# Patient Record
Sex: Female | Born: 1965 | Race: White | Hispanic: No
Health system: Midwestern US, Community
[De-identification: ages and names within clinical notes are randomized; demographics above are authoritative.]

## PROBLEM LIST (undated history)

## (undated) DIAGNOSIS — N809 Endometriosis, unspecified: Secondary | ICD-10-CM

## (undated) DIAGNOSIS — C801 Malignant (primary) neoplasm, unspecified: Secondary | ICD-10-CM

## (undated) DIAGNOSIS — I10 Essential (primary) hypertension: Secondary | ICD-10-CM

## (undated) DIAGNOSIS — N390 Urinary tract infection, site not specified: Secondary | ICD-10-CM

## (undated) DIAGNOSIS — G43909 Migraine, unspecified, not intractable, without status migrainosus: Secondary | ICD-10-CM

## (undated) DIAGNOSIS — G8929 Other chronic pain: Secondary | ICD-10-CM

## (undated) DIAGNOSIS — Z8711 Personal history of peptic ulcer disease: Secondary | ICD-10-CM

## (undated) DIAGNOSIS — D649 Anemia, unspecified: Secondary | ICD-10-CM

## (undated) DIAGNOSIS — K219 Gastro-esophageal reflux disease without esophagitis: Secondary | ICD-10-CM

## (undated) DIAGNOSIS — Z8719 Personal history of other diseases of the digestive system: Secondary | ICD-10-CM

## (undated) DIAGNOSIS — E538 Deficiency of other specified B group vitamins: Secondary | ICD-10-CM

## (undated) DIAGNOSIS — K429 Umbilical hernia without obstruction or gangrene: Secondary | ICD-10-CM

## (undated) DIAGNOSIS — M5116 Intervertebral disc disorders with radiculopathy, lumbar region: Secondary | ICD-10-CM

## (undated) DIAGNOSIS — N200 Calculus of kidney: Secondary | ICD-10-CM

## (undated) DIAGNOSIS — M5020 Other cervical disc displacement, unspecified cervical region: Secondary | ICD-10-CM

## (undated) DIAGNOSIS — M940 Chondrocostal junction syndrome [Tietze]: Secondary | ICD-10-CM

## (undated) DIAGNOSIS — H43399 Other vitreous opacities, unspecified eye: Secondary | ICD-10-CM

## (undated) DIAGNOSIS — F4329 Adjustment disorder with other symptoms: Secondary | ICD-10-CM

## (undated) DIAGNOSIS — J069 Acute upper respiratory infection, unspecified: Secondary | ICD-10-CM

## (undated) DIAGNOSIS — M9908 Segmental and somatic dysfunction of rib cage: Secondary | ICD-10-CM

## (undated) DIAGNOSIS — R21 Rash and other nonspecific skin eruption: Secondary | ICD-10-CM

## (undated) DIAGNOSIS — E559 Vitamin D deficiency, unspecified: Secondary | ICD-10-CM

## (undated) DIAGNOSIS — Z79818 Long term (current) use of other agents affecting estrogen receptors and estrogen levels: Secondary | ICD-10-CM

## (undated) DIAGNOSIS — M5416 Radiculopathy, lumbar region: Secondary | ICD-10-CM

## (undated) DIAGNOSIS — M51369 Other intervertebral disc degeneration, lumbar region without mention of lumbar back pain or lower extremity pain: Secondary | ICD-10-CM

## (undated) DIAGNOSIS — M7520 Bicipital tendinitis, unspecified shoulder: Secondary | ICD-10-CM

## (undated) DIAGNOSIS — E876 Hypokalemia: Secondary | ICD-10-CM

## (undated) DIAGNOSIS — M5136 Other intervertebral disc degeneration, lumbar region: Secondary | ICD-10-CM

## (undated) HISTORY — DX: Long term (current) use of other agents affecting estrogen receptors and estrogen levels: Z79.818

## (undated) HISTORY — DX: Acute upper respiratory infection, unspecified: J06.9

## (undated) HISTORY — DX: Bicipital tendinitis, unspecified shoulder: M75.20

## (undated) HISTORY — PX: HX COLPOSCOPY: SHX161

## (undated) HISTORY — PX: LUMBAR EPIDURAL INJECTION: SHX1980

## (undated) HISTORY — DX: Umbilical hernia without obstruction or gangrene: K42.9

## (undated) HISTORY — PX: UMBILICAL HERNIA REPAIR: SHX196

## (undated) HISTORY — PX: HX PELVIC LAPAROSCOPY: SHX162

## (undated) HISTORY — DX: Deficiency of other specified B group vitamins: E53.8

## (undated) HISTORY — DX: Hypokalemia: E87.6

## (undated) HISTORY — PX: LASIK: SHX215

## (undated) HISTORY — DX: Other intervertebral disc degeneration, lumbar region without mention of lumbar back pain or lower extremity pain: M51.369

## (undated) HISTORY — DX: Vitamin D deficiency, unspecified: E55.9

## (undated) HISTORY — DX: Calculus of kidney: N20.0

## (undated) HISTORY — PX: HX MENISCECTOMY: SHX123

## (undated) HISTORY — PX: HX CARPAL TUNNEL RELEASE: SHX101

## (undated) HISTORY — DX: Rash and other nonspecific skin eruption: R21

## (undated) HISTORY — DX: Gastro-esophageal reflux disease without esophagitis: K21.9

## (undated) HISTORY — DX: Endometriosis, unspecified: N80.9

## (undated) HISTORY — DX: Migraine, unspecified, not intractable, without status migrainosus: G43.909

## (undated) HISTORY — DX: Segmental and somatic dysfunction of rib cage: M99.08

## (undated) HISTORY — PX: CYSTOSCOPY: SUR368

## (undated) HISTORY — DX: Essential (primary) hypertension: I10

## (undated) HISTORY — DX: Chondrocostal junction syndrome (tietze): M94.0

## (undated) HISTORY — PX: HX DACRYOCYSTORHINOSTOMY: 2100001142

## (undated) HISTORY — PX: HX LUMBAR DISKECTOMY: SHX99

## (undated) HISTORY — DX: Adjustment disorder with other symptoms: F43.29

## (undated) HISTORY — DX: Other intervertebral disc degeneration, lumbar region: M51.36

## (undated) HISTORY — DX: Other chronic pain: G89.29

## (undated) HISTORY — PX: LAMINECTOMY: SHX219

## (undated) HISTORY — DX: Radiculopathy, lumbar region: M54.16

## (undated) HISTORY — DX: Other cervical disc displacement, unspecified cervical region: M50.20

## (undated) HISTORY — PX: SKIN CANCER EXCISION: SHX779

## (undated) HISTORY — DX: Other vitreous opacities, unspecified eye: H43.399

## (undated) HISTORY — PX: FULKERSON OSTEOTOMY: SHX1689

## (undated) HISTORY — PX: KNEE ARTHROSCOPY WITH FULKERSON SLIDE: SHX5648

## (undated) HISTORY — PX: LAPAROSCOPIC ENDOMETRIOSIS FULGURATION: SUR769

## (undated) HISTORY — PX: TONSILLECTOMY AND ADENOIDECTOMY: SHX28

## (undated) HISTORY — DX: Anemia, unspecified: D64.9

## (undated) HISTORY — DX: Personal history of other diseases of the digestive system: Z87.19

## (undated) HISTORY — DX: Personal history of peptic ulcer disease: Z87.11

## (undated) HISTORY — DX: Malignant (primary) neoplasm, unspecified: C80.1

## (undated) HISTORY — DX: Urinary tract infection, site not specified: N39.0

## (undated) HISTORY — PX: APPENDECTOMY: SHX54

## (undated) HISTORY — PX: OTHER SURGICAL HISTORY: SHX169

## (undated) HISTORY — PX: LUMBAR DISC SURGERY: SHX700

---
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
## 1984-05-25 DIAGNOSIS — O039 Complete or unspecified spontaneous abortion without complication: Secondary | ICD-10-CM

## 1984-05-25 HISTORY — DX: Complete or unspecified spontaneous abortion without complication: O03.9

## 1990-05-25 HISTORY — PX: HX APPENDECTOMY: SHX54

## 1999-06-06 ENCOUNTER — Emergency Department (HOSPITAL_COMMUNITY): Payer: Self-pay | Admitting: Emergency Medicine

## 2007-08-02 LAB — T3, FREE: T3,FREE: 2.9 pg/mL (ref 2.4–4.2)

## 2007-08-02 LAB — T4, FREE: T4, Free: 1 NG/DL (ref 0.89–1.76)

## 2007-08-02 LAB — TSH 3RD GENERATION: TSH: 3.02 u[IU]/mL (ref 0.35–5.5)

## 2007-09-20 LAB — METABOLIC PANEL, COMPREHENSIVE
A-G Ratio: 1.2 (ref 1.1–2.2)
ALT (SGPT): 29 U/L — ABNORMAL LOW (ref 30–65)
AST (SGOT): 10 U/L — ABNORMAL LOW (ref 15–37)
Albumin: 3.9 g/dL (ref 3.5–5.0)
Alk. phosphatase: 73 U/L (ref 50–136)
Anion gap: 11 mmol/L (ref 5–15)
BUN/Creatinine ratio: 18 (ref 12–20)
BUN: 14 MG/DL (ref 6–20)
Bilirubin, total: 0.2 MG/DL (ref <1.0)
CO2: 27 MMOL/L (ref 21–32)
Calcium: 9.2 MG/DL (ref 8.5–10.1)
Chloride: 103 MMOL/L (ref 97–108)
Creatinine: 0.8 MG/DL (ref 0.6–1.3)
GFR est AA: >60 mL/min/{1.73_m2} (ref >60)
GFR est non-AA: >60 mL/min/{1.73_m2} (ref >60)
Globulin: 3.3 g/dL (ref 2.0–4.0)
Glucose: 85 MG/DL (ref 50–100)
Potassium: 3.4 MMOL/L — ABNORMAL LOW (ref 3.5–5.1)
Protein, total: 7.2 g/dL (ref 6.4–8.2)
Sodium: 141 MMOL/L (ref 136–145)

## 2008-03-01 LAB — CBC WITH AUTOMATED DIFF
ABS. BASOPHILS: 0 10*3/uL (ref 0.0–0.1)
ABS. EOSINOPHILS: 0.4 10*3/uL (ref 0.0–0.4)
ABS. LYMPHOCYTES: 3.2 10*3/uL (ref 0.8–3.5)
ABS. MONOCYTES: 0.4 10*3/uL (ref 0–1.0)
ABS. NEUTROPHILS: 2.8 10*3/uL (ref 1.8–8.0)
BASOPHILS: 0 % (ref 0–1)
EOSINOPHILS: 5 % (ref 0–7)
HCT: 36.5 % (ref 35.0–47.0)
HGB: 12.6 g/dL (ref 11.5–16.0)
LYMPHOCYTES: 48 % (ref 12–49)
MCH: 31.8 PG (ref 26.0–34.0)
MCHC: 34.5 g/dL (ref 30.0–35.0)
MCV: 92.2 FL (ref 80.0–99.0)
MONOCYTES: 5 % (ref 5–13)
NEUTROPHILS: 42 % (ref 32–75)
PLATELET: 334 10*3/uL (ref 150–400)
RBC: 3.96 M/uL (ref 3.80–5.20)
RDW: 12.1 % (ref 11.5–14.5)
WBC: 6.8 10*3/uL (ref 3.6–11.0)

## 2008-03-01 LAB — LIPID PANEL
CHOL/HDL Ratio: 3.7 (ref 0–5.0)
Cholesterol, total: 183 MG/DL (ref <200)
HDL Cholesterol: 49 MG/DL (ref 40–60)
LDL, calculated: 126 MG/DL — ABNORMAL HIGH (ref 0–100)
Triglyceride: 40 MG/DL (ref 30–200)
VLDL, calculated: 8 MG/DL

## 2008-03-01 LAB — T4, FREE: T4, Free: 1 NG/DL (ref 0.9–1.8)

## 2008-03-01 LAB — METABOLIC PANEL, COMPREHENSIVE
A-G Ratio: 1.2 (ref 1.1–2.2)
ALT (SGPT): 34 U/L (ref 30–65)
AST (SGOT): 16 U/L (ref 15–37)
Albumin: 3.6 g/dL (ref 3.5–5.0)
Alk. phosphatase: 62 U/L (ref 50–136)
Anion gap: 9 mmol/L (ref 5–15)
BUN/Creatinine ratio: 19 (ref 12–20)
BUN: 13 MG/DL (ref 6–20)
Bilirubin, total: 0.3 MG/DL (ref <1.0)
CO2: 27 MMOL/L (ref 21–32)
Calcium: 9 MG/DL (ref 8.5–10.1)
Chloride: 102 MMOL/L (ref 97–108)
Creatinine: 0.7 MG/DL (ref 0.6–1.3)
GFR est AA: >60 mL/min/{1.73_m2} (ref >60)
GFR est non-AA: >60 mL/min/{1.73_m2} (ref >60)
Globulin: 3.1 g/dL (ref 2.0–4.0)
Glucose: 80 MG/DL (ref 50–100)
Potassium: 3.3 MMOL/L — ABNORMAL LOW (ref 3.5–5.1)
Protein, total: 6.7 g/dL (ref 6.4–8.2)
Sodium: 138 MMOL/L (ref 136–145)

## 2008-03-01 LAB — TSH 3RD GENERATION: TSH: 1.13 u[IU]/mL (ref 0.35–5.5)

## 2008-03-08 LAB — EC8 ISTAT, POC
BUN (POC): 11 MG/DL (ref 9–20)
Base excess (POC): 2 mmol/L — ABNORMAL LOW (ref 5–15)
CO2 (POC): 28 MMOL/L (ref 21–32)
Chloride (POC): 102 MMOL/L (ref 98–107)
Glucose (POC): 90 MG/DL (ref 65–105)
HCO3 (POC): 26.5 mmol/L — ABNORMAL HIGH (ref 22–26)
Hematocrit (POC): 38 % (ref 35.0–47.0)
Hemoglobin (POC): 12.9 GM/DL (ref 11.5–16.0)
Potassium (POC): 3.4 MMOL/L — ABNORMAL LOW (ref 3.5–5.1)
Sodium (POC): 136 MMOL/L — ABNORMAL LOW (ref 137–145)
pCO2 (POC): 41.5 MMHG (ref 35–45)
pH (POC): 7.41 (ref 7.35–7.45)

## 2008-03-09 NOTE — Op Note (Signed)
Op Notes signed by  at 03/09/08 0734                  Author: Nicky Pugh, MD  Service: --  Author Type: Physician            Filed:   Date of Service: 03/09/08 0101  Status: Signed          <!--EPICS--> Name:      [name], [name] MR #:      [id number]                    Surgeon:        Nicky Pugh, MD<BR> Account #: [id number]                  Surgery Date:   03/08/2008<BR> DOB:       08/26/1965<BR> Age:       42                           Location:<BR> <BR>                              OPERATIVE REPORT<BR> <BR> <BR> PREOPERATIVE DIAGNOSIS:  Patellar maltracking.<BR> <BR> POSTOPERATIVE  DIAGNOSES:<BR> 1.     Patellar maltracking.<BR> 2.     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     Fulkerson osteotomy.<BR> 2.     Lateral release, right knee.<BR>  3.     Chondroplasty, right medial femoral condyle.<BR> 4.     Chondroplasty, right patella.<BR> <BR> SURGEON:  Nicky Pugh, MD<BR> <BR> ASSISTANT:  Zachery C. Hamby, MD<BR> <BR> COMPLICATIONS:  None.<BR> <BR> ESTIMATED BLOOD LOSS:  Minimal.<BR> <BR>  SPECIMENS:<BR> <BR> CONDITION:  Postoperatively stable.<BR> <BR> INDICATIONS FOR PROCEDURE:  The patient is a very pleasant, 42 year old<BR> female, who presented to Dr. Franky Macho with persistent right knee pain.  She<BR> has a history of previous open lateral  release when she was a kid with<BR> significant persistent pain.  She was consented for the procedure above and<BR> voiced understanding to the risks, benefits, and alternatives to the<BR> procedure.<BR> <BR> PROCEDURE IN DETAIL:  The patient was sent  back to the operating room,<BR> transferred over to the operating table.  All bony prominences were padded,<BR> and preoperative antibiotics were given.  She was placed under general<BR> anesthesia by the Anesthesia Team.  Her right lower extremity was  then<BR> prepped and draped in meticulous sterile fashion.   A time-out was called<BR> and everybody in the room including Anesthesia agreed with the correct<BR> patient, procedure, and extremity.<BR> <BR> Standard posterior medial and posterior lateral  portals were made.  We did<BR> a diagnostic arthroscopy, which showed chondromalacia grade 2 and 3 of the<BR> medial femoral condyle and patella.  Also showed obvious patellar<BR> maltracking.  Both menisci were intact and the rest of the cartilage<BR>  throughout the knee was intact.  No loose bodies were appreciated, and ACL<BR> was intact.  We therefore did a chondroplasty of the undersurface of the<BR> patella and of the medial femoral condyle.  We then proceeded to the<BR> lateral release.  We used  a [employer] to arthroscopically<BR> laterally release her lateral retinaculum.  This was done without<BR> complication, and this was done from the superior aspect of the patella,<BR> down distal to the portal site.  The  patella seemed to track  somewhat<BR> better but was not greatly improved by this lateral release.  We therefore<BR> proceeded with a Fulkerson osteotomy.  We sterilely raised the end of the<BR> bed and proceeded with an incision over the tibial tubercle.  We dissected<BR> down  and found the patellar tendon and dissected around the patellar<BR> tendon.  At this time, we placed two 3.2 drill bits from medial to lateral<BR> along the tibial tubercle.  We used these as our guide and made a saw cut.<BR> We left the inferior aspect  of the tibial tubercle intact, switched and<BR> swung the tibial tubercle anteromedially.  This was done successfully and<BR> the patella tracking was much improved after we did this.  We then placed<BR> two 4.5 Synthes screws using a lag screw technique.   These were placed<BR> through the tibial tubercle and to the tibia.  This was done without<BR> complication.  We then irrigated the wounds copiously, closed deep with the<BR> one 0 Vicryl suture and then closed the  subcutaneous tissue with 2-0 Vicryl<BR>  and ran a subcuticular stitch, which was 3-0 Monocryl suture.  This was<BR> followed by Steri-Strips.  We closed the 2 portal sites with Prolene<BR> sutures.  Sterile dressing was then placed and she was placed in an<BR> immobilizer.  She will be given  postoperative instructions and sent home<BR> today.  Prescriptions were written and ready for her.  She will follow up<BR> with Dr. Franky Macho next week and call us with any concerns.<BR> <BR> <BR> Dictated by Blanch Media. Hamby, MD<BR> <BR> <BR> <BR> E-Signed  By<BR> Nicky Pugh, MD 03/09/2008 07:34<BR> Nicky Pugh, MD<BR> <BR> cc:   [facility], MD<BR> <BR> <BR> <BR> WB/FI3; D: 03/08/2008  2:22 P; T: 03/09/2008  1:01 A; Doc# [id number]; Job#<BR> [id number]<ZD> <!--EPICE-->

## 2008-03-09 NOTE — Op Note (Signed)
Name: [name], [name]  MR #: [id number] Surgeon: Nicky Pugh, MD  Account #: [id number] Surgery Date: 03/08/2008  DOB: 1966/02/14  Age: 42 Location:     OPERATIVE REPORT      PREOPERATIVE DIAGNOSIS: Patellar maltracking.    POSTOPERATIVE DIAGNOSES:  1. Patellar maltracking.  2. 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  3. 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    PROCEDURES:  1. Fulkerson osteotomy.  2. Lateral release, right knee.  3. Chondroplasty, right medial femoral condyle.  4. Chondroplasty, right patella.    SURGEON: Nicky Pugh, MD    ASSISTANT: Blanch Media. Costella Hatcher, MD    COMPLICATIONS: None.    ESTIMATED BLOOD LOSS: Minimal.    SPECIMENS:    CONDITION: Postoperatively stable.    INDICATIONS FOR PROCEDURE: The patient is [name] very pleasant, 42 year old  female, who presented to Dr. Franky Macho with persistent right knee pain. She  has [name] history of previous open lateral release when she was [name] kid with  significant persistent pain. She was consented for the procedure above and  voiced understanding to the risks, benefits, and alternatives to the  procedure.    PROCEDURE IN DETAIL: The patient was sent back to the operating room,  transferred over to the operating table. All bony prominences were padded,  and preoperative antibiotics were given. She was placed under general  anesthesia by the Anesthesia Team. Her right lower extremity was then  prepped and draped in meticulous sterile fashion. [name] time-out was called  and everybody in the room including Anesthesia agreed with the correct  patient, procedure, and extremity.    Standard posterior medial and posterior lateral portals were made. We did  [name] diagnostic arthroscopy, which showed chondromalacia grade 2 and 3 of the  medial femoral condyle and patella. Also showed obvious patellar  maltracking. Both menisci were intact and the rest of the cartilage  throughout the knee was intact. No loose bodies were appreciated, and ACL   was intact. We therefore did [name] chondroplasty of the undersurface of the  patella and of the medial femoral condyle. We then proceeded to the  lateral release. We used [name] [employer] to arthroscopically  laterally release her lateral retinaculum. This was done without  complication, and this was done from the superior aspect of the patella,  down distal to the portal site. The patella seemed to track somewhat  better but was not greatly improved by this lateral release. We therefore  proceeded with [name] Fulkerson osteotomy. We sterilely raised the end of the  bed and proceeded with an incision over the tibial tubercle. We dissected  down and found the patellar tendon and dissected around the patellar  tendon. At this time, we placed two 3.2 drill bits from medial to lateral  along the tibial tubercle. We used these as our guide and made [name] saw cut.  We left the inferior aspect of the tibial tubercle intact, switched and  swung the tibial tubercle anteromedially. This was done successfully and  the patella tracking was much improved after we did this. We then placed  two 4.5 Synthes screws using [name] lag screw technique. These were placed  through the tibial tubercle and to the tibia. This was done without  complication. We then irrigated the wounds copiously, closed deep with the  one 0 Vicryl suture and then closed the subcutaneous tissue with 2-0 Vicryl  and ran [name] subcuticular stitch, which was 3-0 Monocryl suture. This was  followed  by Steri-Strips. We closed the 2 portal sites with Prolene  sutures. Sterile dressing was then placed and she was placed in an  immobilizer. She will be given postoperative instructions and sent home  today. Prescriptions were written and ready for her. She will follow up  with Dr. Franky Macho next week and call us with any concerns.      Dictated by Blanch Media Costella Hatcher, MD        E-Signed By  Nicky Pugh, MD 03/09/2008 07:34  Nicky Pugh, MD    cc: Nicky Pugh, MD         WB/FI3; D: 03/08/2008 2:22 P; T: 03/09/2008 1:01 [name]; Doc# [id number]; Job#  [id number]

## 2008-08-04 LAB — METABOLIC PANEL, COMPREHENSIVE
A-G Ratio: 1.2 (ref 1.1–2.2)
ALT (SGPT): 35 U/L (ref 30–65)
AST (SGOT): 16 U/L (ref 15–37)
Albumin: 3.8 g/dL (ref 3.5–5.0)
Alk. phosphatase: 64 U/L (ref 50–136)
Anion gap: 13 mmol/L (ref 5–15)
BUN/Creatinine ratio: 19 (ref 12–20)
BUN: 15 MG/DL (ref 6–20)
Bilirubin, total: 0.3 MG/DL (ref 0.2–1.0)
CO2: 25 MMOL/L (ref 21–32)
Calcium: 8.7 MG/DL (ref 8.5–10.1)
Chloride: 99 MMOL/L (ref 97–108)
Creatinine: 0.8 MG/DL (ref 0.6–1.3)
GFR est AA: >60 mL/min/{1.73_m2} (ref >60)
GFR est non-AA: >60 mL/min/{1.73_m2} (ref >60)
Globulin: 3.1 g/dL (ref 2.0–4.0)
Glucose: 78 MG/DL (ref 50–100)
Potassium: 3.1 MMOL/L — ABNORMAL LOW (ref 3.5–5.1)
Protein, total: 6.9 g/dL (ref 6.4–8.4)
Sodium: 137 MMOL/L (ref 136–145)

## 2008-08-30 LAB — CULTURE, URINE
Colonies Counted: <1000
Colony Count: <1000
Culture result:: NO GROWTH
Culture: NO GROWTH

## 2008-09-03 LAB — MAGNESIUM: Magnesium: 2.1 MG/DL (ref 1.6–2.4)

## 2009-02-20 LAB — METABOLIC PANEL, COMPREHENSIVE
A-G Ratio: 1.1 (ref 1.1–2.2)
ALT (SGPT): 20 U/L (ref 12–78)
AST (SGOT): 13 U/L — ABNORMAL LOW (ref 15–37)
Albumin: 4.2 g/dL (ref 3.5–5.0)
Alk. phosphatase: 57 U/L (ref 50–136)
Anion gap: 14 mmol/L (ref 5–15)
BUN/Creatinine ratio: 20 (ref 12–20)
BUN: 16 MG/DL (ref 6–20)
Bilirubin, total: 0.2 MG/DL (ref 0.2–1.0)
CO2: 23 MMOL/L (ref 21–32)
Calcium: 9.6 MG/DL (ref 8.5–10.1)
Chloride: 101 MMOL/L (ref 97–108)
Creatinine: 0.8 MG/DL (ref 0.6–1.3)
GFR est AA: >60 mL/min/{1.73_m2} (ref >60)
GFR est non-AA: >60 mL/min/{1.73_m2} (ref >60)
Globulin: 3.7 g/dL (ref 2.0–4.0)
Glucose: 63 MG/DL — ABNORMAL LOW (ref 65–100)
Potassium: 3.6 MMOL/L (ref 3.5–5.1)
Protein, total: 7.9 g/dL (ref 6.4–8.2)
Sodium: 138 MMOL/L (ref 136–145)

## 2009-02-20 LAB — LIPID PANEL
CHOL/HDL Ratio: 4.1 (ref 0–5.0)
Cholesterol, total: 207 MG/DL — ABNORMAL HIGH (ref <200)
HDL Cholesterol: 51 MG/DL
LDL, calculated: 124.8 MG/DL — ABNORMAL HIGH (ref 0–100)
Triglyceride: 156 MG/DL — ABNORMAL HIGH (ref <150)
VLDL, calculated: 31.2 MG/DL

## 2009-02-20 LAB — CBC WITH AUTOMATED DIFF
ABS. BASOPHILS: 0 10*3/uL (ref 0.0–0.1)
ABS. EOSINOPHILS: 0.3 10*3/uL (ref 0.0–0.4)
ABS. LYMPHOCYTES: 5.7 10*3/uL — ABNORMAL HIGH (ref 0.8–3.5)
ABS. MONOCYTES: 0.7 10*3/uL (ref 0.0–1.0)
ABS. NEUTROPHILS: 5.6 10*3/uL (ref 1.8–8.0)
BASOPHILS: 0 % (ref 0–1)
EOSINOPHILS: 2 % (ref 0–7)
HCT: 40.9 % (ref 35.0–47.0)
HGB: 14.2 g/dL (ref 11.5–16.0)
LYMPHOCYTES: 47 % (ref 12–49)
MCH: 32 PG (ref 26.0–34.0)
MCHC: 34.7 g/dL (ref 30.0–36.5)
MCV: 92.1 FL (ref 80.0–99.0)
MONOCYTES: 6 % (ref 5–13)
NEUTROPHILS: 45 % (ref 32–75)
PLATELET: 460 10*3/uL — ABNORMAL HIGH (ref 150–400)
RBC: 4.44 M/uL (ref 3.80–5.20)
RDW: 12.7 % (ref 11.5–14.5)
WBC: 12.3 10*3/uL — ABNORMAL HIGH (ref 3.6–11.0)

## 2010-04-07 LAB — AMB POC RAPID STREP A

## 2010-04-07 NOTE — Progress Notes (Signed)
Chief Complaint   Patient presents with   ??? Sore Throat     since last pm   ??? Ringing in Ear     bilateral, since last pm, full feeling

## 2010-04-07 NOTE — Patient Instructions (Addendum)
MyChart Activation    Thank you for requesting access to MyChart. Please follow the instructions below to securely access and download your online medical record. MyChart allows you to send messages to your doctor, view your test results, renew your prescriptions, schedule appointments, and more.    How Do I Sign Up?    1. In your internet browser, go to [email or web address].  2. Click on the First Time User? Click Here link in the Sign In box. You will see the New Member Sign Up page.  3. Enter your MyChart Access Code exactly as it appears below. You will not need to use this code after you???ve completed the sign-up process. If you do not sign up before the expiration date, you must request a new code.    MyChart Access Code: [email or web address]  Expires: 07/06/10 09:11 AM (This is the date your MyChart access code will expire)    4. Enter the last four digits of your Social Security Number (xxxx) and Date of Birth (mm/dd/yyyy) as indicated and click Submit. You will be taken to the next sign-up page.  5. Create a MyChart ID. This will be your MyChart login ID and cannot be changed, so think of one that is secure and easy to remember.  6. Create a MyChart password. You can change your password at any time.  7. Enter your Password Reset Question and Answer. This can be used at a later time if you forget your password.   8. Enter your e-mail address. You will receive e-mail notification when new information is available in MyChart.  9. Click Sign Up. You can now view and download portions of your medical record.  10. Click the Download Summary menu link to download a portable copy of your medical information.    Additional Information    If you have questions, please call [phone]. Remember, MyChart is NOT to be used for urgent needs. For medical emergencies, dial 911.      Upper Respiratory Infection: After Your Visit  Your Care Instructions    An upper respiratory infection, also called a URI, is an infection of the nose, sinuses, or throat. Viruses or bacteria can cause URIs. Colds, the flu, and sinusitis are examples of URIs. These infections are spread by coughs, sneezes, and close contact.  You may need antibiotics to treat bacterial infections. Antibiotics do not help viral infections. But you can treat most infections with home care. This may include drinking lots of fluids and taking over-the-counter pain medicine. You will probably feel better in 4 to 10 days.  Follow-up care is a key part of your treatment and safety. Be sure to make and go to all appointments, and call your doctor if you are having problems. It???s also a good idea to know your test results and keep a list of the medicines you take.  How can you care for yourself at home?  ?? To prevent dehydration, drink plenty of fluids, enough so that your urine is light yellow or clear like water. Choose water and other caffeine-free clear liquids until you feel better. If you have kidney, heart, or liver disease and have to limit fluids, talk with your doctor before you increase the amount of fluids you drink.   ?? Take an over-the-counter pain medicine, such as acetaminophen (Tylenol), ibuprofen (Advil, Motrin), or naproxen (Aleve). Read and follow all instructions on the label.   ?? If your doctor prescribed antibiotics, take them as directed. Do not stop taking them just  because you feel better. You need to take the full course of antibiotics.   ?? Before you use cough and cold medicines, check the label. These medicines may not be safe for young children or for people with certain health problems.   ?? Be careful when taking over-the-counter cold or flu medicines and Tylenol at the same time. Many of these medicines have acetaminophen, which is Tylenol. Read the labels to make sure that you are not taking more than the recommended dose. Too much acetaminophen (Tylenol) can be harmful.    ?? Get plenty of rest.   ?? Use saline (saltwater) nasal washes to help keep your nasal passages open and wash out mucus and bacteria. You can buy saline nose drops at a grocery store or drugstore. Or you can make your own at home by mixing ?? teaspoon salt, 1 cup water (at room temperature), and ?? teaspoon baking soda. If you make your own, fill a bulb syringe with the solution, insert the tip into your nostril, and squeeze gently. Blow your nose.   ?? Use a vaporizer or humidifier to add moisture to your bedroom. Follow the instructions for cleaning the machine.   ?? Do not smoke or allow others to smoke around you. If you need help quitting, talk to your doctor about stop-smoking programs and medicines. These can increase your chances of quitting for good.  When should you call for help?  Call 911 anytime you think you may need emergency care. For example, call if:  ?? You have severe trouble breathing.   ?? You have rapid swelling of the throat or tongue.  Call your doctor now or seek immediate medical care if:  ?? You have a fever with a stiff neck or a severe headache.   ?? You have signs of needing more fluids. You have sunken eyes, a dry mouth, and pass only a little dark urine.   ?? You cannot keep down fluids or medicine.  Watch closely for changes in your health, and be sure to contact your doctor if:  ?? You have a deep cough and a lot of mucus.   ?? You are too tired to eat or drink.   ?? You have a new symptom, such as a sore throat, an earache, or a rash.   ?? You do not get better as expected.    Where can you learn more?   Go to [email or web address]  Enter K520 in the search box to learn more about "Upper Respiratory Infection: After Your Visit."    ?? 2006-2011 [employer]. Care instructions adapted under license by [employer] (which disclaims liability or warranty for this information). This care instruction is for use with your licensed healthcare professional. If you have questions about a medical condition or this instruction, always ask your healthcare professional. [employer]wise, Incorporated disclaims any warranty or liability for your use of this information.  Content Version: 9.0.56994; Last Revised: April 25, 2009

## 2010-04-07 NOTE — Progress Notes (Signed)
HISTORY OF PRESENT ILLNESS  [name] is a 44 y.o. female.  HPI  Presents with 2day h/o sore throat and fullness in ears. Has taken motrin without improvement.  Review of Systems   Constitutional: Negative.    HENT: Positive for sore throat. Negative for ear pain and congestion.    Eyes: Negative.    Respiratory: Negative.  Negative for cough, sputum production, shortness of breath and wheezing.    Cardiovascular: Negative.    Gastrointestinal: Negative.    Genitourinary: Negative.    Skin: Negative.    Neurological: Negative.  Negative for headaches.   Endo/Heme/Allergies: Negative.    Psychiatric/Behavioral: Negative.        Physical Exam   Nursing note and vitals reviewed.  Constitutional: She is oriented to person, place, and time. She appears well-developed and well-nourished.   HENT:   Head: Normocephalic and atraumatic.   Right Ear: External ear normal.   Left Ear: External ear normal.   Mouth/Throat: No oropharyngeal exudate.        Moderate erythema of posterior pharynx     Eyes: Conjunctivae are normal.   Neck: Normal range of motion. Neck supple.   Cardiovascular: Normal rate, regular rhythm and normal heart sounds.    Pulmonary/Chest: Effort normal and breath sounds normal. She has no wheezes. She has no rales.   Abdominal: Soft.   Lymphadenopathy:     She has no cervical adenopathy.   Neurological: She is alert and oriented to person, place, and time.   Skin: Skin is warm and dry.   Psychiatric: She has a normal mood and affect.       ASSESSMENT and PLAN  1. Acute URI (465.9C)     2. Sore throat (462AA)  AMB POC RAPID STREP A       Rapid strep: neg  AVS and symptomatic care discussed with pt.  FU INI/prn. Agrees with POC.

## 2010-04-09 LAB — CULTURE, THROAT

## 2010-04-14 NOTE — Progress Notes (Addendum)
Addended by: Marchia Meiers on: 04/14/2010      Modules accepted: Orders

## 2010-05-30 MED ORDER — TETRACAINE HCL (PF) 0.5 % EYE DROPS
0.5 % | OPHTHALMIC | Status: AC
Start: 2010-05-30 — End: 2010-05-30

## 2010-05-30 MED ORDER — FLUORESCEIN 1 MG EYE STRIPS
1 mg | OPHTHALMIC | Status: AC
Start: 2010-05-30 — End: 2010-05-30

## 2010-05-30 MED ADMIN — tetracaine HCl (PF) (PONTOCAINE) 0.5 % ophthalmic solution: OPHTHALMIC | @ 11:00:00 | NDC 00065074112

## 2010-05-30 MED ADMIN — fluorescein (FUL-GLO) 1 mg ophthalmic strip: OPHTHALMIC | @ 11:00:00 | NDC 17238090011

## 2010-05-30 MED FILL — BIOGLO 1 MG EYE STRIPS: 1 mg | OPHTHALMIC | Qty: 1

## 2010-05-30 MED FILL — TETRACAINE HCL (PF) 0.5 % EYE DROPS: 0.5 % | OPHTHALMIC | Qty: 2

## 2010-05-30 NOTE — ED Notes (Signed)
Dr. Rodolph Bong at bedside examining patient's eye

## 2010-05-30 NOTE — ED Notes (Signed)
Triage Note: "I've got something in my left eye."  Patient reports she fell asleep at 1am and the pain woke her from sleep at 4am.

## 2010-05-30 NOTE — ED Notes (Signed)
Informed physician of visual acuity results. Patient states, "I still feel like I have something in my eye"

## 2010-05-30 NOTE — ED Notes (Signed)
Patient given discharge instructions by physician.  No further questions or requests prior to discharge.

## 2010-05-30 NOTE — ED Provider Notes (Signed)
HPI Comments: 45 year old female presents secondary to left eye pain. States that she had discomfort affecting the left eye with onset at approximately 4:00 this morning. This patient as she feels as if there is something in the left eye. Denies any redness. Does not wear contacts or glasses. Pain is described as sharp moderate in intensity. Nothing makes it better or worse. No visual changes    pmxh noted  pshx noted  fmhx non contributory  sochx no tob use    Sharol Harness, MD      Patient is a 45 y.o. female presenting with eye pain.   Eye Pain   Associated symptoms include pain.        Past Medical History   Diagnosis Date   ??? Hypertension           Past Surgical History   Procedure Date   ??? Hx orthopaedic    ??? Hx appendectomy    ??? Hx gyn    ??? Hx tonsil and adenoidectomy            Family History   Problem Relation Age of Onset   ??? Hypertension Father    ??? Heart Disease Father           History   Social History   ??? Marital Status: Divorced     Spouse Name: N/A     Number of Children: N/A   ??? Years of Education: N/A   Occupational History   ??? Not on file.   Social History Main Topics   ??? Smoking status: Never Smoker    ??? Smokeless tobacco: Not on file   ??? Alcohol Use: No   ??? Drug Use:    ??? Sexually Active:    Other Topics Concern   ??? Not on file   Social History Narrative   ??? No narrative on file                    ALLERGIES: Bee sting      Review of Systems   Eyes: Positive for pain.   All other systems reviewed and are negative.        Filed Vitals:    05/30/10 0614   BP: 148/93   Pulse: 73   Temp: 97.5 ??F (36.4 ??C)   Resp: 18   Height: 5\' 7"  (1.702 m)   Weight: 130 lb (58.968 kg)   SpO2: 100%              Physical Exam   Nursing note and vitals reviewed.  Constitutional: She is oriented to person, place, and time. She appears well-developed and well-nourished.   HENT:   Head: Normocephalic and atraumatic.   Mouth/Throat: Oropharynx is clear and moist.    Eyes: Conjunctivae and EOM are normal. Pupils are equal, round, and reactive to light.        Left eye: conjuctival injection. Neg fluro uptake on staining. +reactive. Eomi -- no pain with this   Neck: Normal range of motion. Neck supple.   Cardiovascular: Normal rate, regular rhythm, normal heart sounds and intact distal pulses.    Pulmonary/Chest: Effort normal and breath sounds normal.   Abdominal: Soft. Bowel sounds are normal.   Musculoskeletal: Normal range of motion.   Neurological: She is alert and oriented to person, place, and time.   Skin: Skin is dry.        MDM     Differential Diagnosis; Clinical Impression; Plan:  FEMALE WITH LEFT EYE PAIN, WILL CONSULT WITH OPTHO  Risk of Significant Complications, Morbidity, and/or Mortality:   Presenting problems:  Low  Diagnostic procedures:  Low  Management options:  Low      Procedures    8:04 AM  SPOKE TO DR. Roxan Hockey AT [facility], WILL SEE IN THE OFFICE. Sharol Harness, MD

## 2010-07-28 LAB — METABOLIC PANEL, BASIC
Anion gap: 8 mmol/L (ref 5–15)
BUN/Creatinine ratio: 20 (ref 12–20)
BUN: 12 MG/DL (ref 6–20)
CO2: 26 MMOL/L (ref 21–32)
Calcium: 8.3 MG/DL — ABNORMAL LOW (ref 8.5–10.1)
Chloride: 105 MMOL/L (ref 97–108)
Creatinine: 0.6 MG/DL (ref 0.6–1.3)
GFR est AA: >60 mL/min/{1.73_m2} (ref >60)
GFR est non-AA: >60 mL/min/{1.73_m2} (ref >60)
Glucose: 75 MG/DL (ref 65–100)
Potassium: 3.4 MMOL/L — ABNORMAL LOW (ref 3.5–5.1)
Sodium: 139 MMOL/L (ref 136–145)

## 2010-07-28 NOTE — Patient Instructions (Signed)
MyChart Activation    Thank you for requesting access to MyChart. Please follow the instructions below to securely access and download your online medical record. MyChart allows you to send messages to your doctor, view your test results, renew your prescriptions, schedule appointments, and more.    How Do I Sign Up?    1. In your internet browser, go to [email or web address]  2. Click on the First Time User? Click Here link in the Sign In box. You will be redirect to the New Member Sign Up page.  3. Enter your MyChart Access Code exactly as it appears below. You will not need to use this code after you???ve completed the sign-up process. If you do not sign up before the expiration date, you must request a new code.    MyChart Access Code: [email or web address]  Expires: 10/26/2010  5:17 PM (This is the date your MyChart access code will expire)    4. Enter the last four digits of your Social Security Number (xxxx) and Date of Birth (mm/dd/yyyy) as indicated and click Submit. You will be taken to the next sign-up page.  5. Create a MyChart ID. This will be your MyChart login ID and cannot be changed, so think of one that is secure and easy to remember.  6. Create a MyChart password. You can change your password at any time.  7. Enter your Password Reset Question and Answer. This can be used at a later time if you forget your password.   8. Enter your e-mail address. You will receive e-mail notification when new information is available in MyChart.  9. Click Sign Up. You can now view and download portions of your medical record.  10. Click the Download Summary menu link to download a portable copy of your medical information.    Additional Information    If you have questions, please call [phone]. Remember, MyChart is NOT to be used for urgent needs. For medical emergencies, dial 911.

## 2010-07-28 NOTE — Other (Signed)
PATIENT GIVEN SURGICAL SITE FAQS INFORMATION HANDOUT.

## 2010-07-28 NOTE — Progress Notes (Signed)
HISTORY OF PRESENT ILLNESS  [name] is a 45 y.o. female.  HPI  Here for pre-op physical for eye surgery on her left eye on 08/19/10. Has been to PAT and has had her labwork and EKG performed. See attaced scanned documents for additional information.    Review of Systems   Constitutional: Negative.    HENT: Negative.    Eyes: Positive for discharge. Negative for blurred vision, double vision, photophobia, pain and redness.        [Constant tearing of left eye due to blocked tear duct  Respiratory: Negative.    Cardiovascular: Negative.    Gastrointestinal: Negative.    Genitourinary: Negative.    Skin: Negative.    [all other systems reviewed and are negative        Physical Exam   Nursing note and vitals reviewed.  Constitutional: She is oriented to person, place, and time. She appears well-developed and well-nourished.   HENT:   Head: Normocephalic and atraumatic.   Right Ear: External ear normal.   Left Ear: External ear normal.   Mouth/Throat: Oropharynx is clear and moist. No oropharyngeal exudate.   Eyes: Conjunctivae and EOM are normal. Pupils are equal, round, and reactive to light. Right eye exhibits no discharge. Left eye exhibits no discharge.        Mild erythema along left tear duct   Neck: Normal range of motion. Neck supple.   Cardiovascular: Normal rate, regular rhythm and normal heart sounds.    Pulmonary/Chest: Effort normal and breath sounds normal.   Abdominal: Soft. Bowel sounds are normal. She exhibits no distension. No tenderness. She has no rebound and no guarding.   Lymphadenopathy:     She has no cervical adenopathy.   Neurological: She is alert and oriented to person, place, and time.   Skin: Skin is warm and dry.   Psychiatric: She has a normal mood and affect.       ASSESSMENT and PLAN

## 2010-07-28 NOTE — Progress Notes (Signed)
Chief Complaint   Patient presents with   ??? Pre-op Exam     needs pre op exam

## 2010-07-29 LAB — EKG, 12 LEAD, INITIAL
Atrial Rate: 59 {beats}/min
Calculated P Axis: 21 degrees
Calculated R Axis: 73 degrees
Calculated T Axis: 36 degrees
Diagnosis: ABNORMAL
P-R Interval: 134 ms
Q-T Interval: 456 ms
QRS Duration: 84 ms
QTC Calculation (Bezet): 451 ms
Ventricular Rate: 59 {beats}/min

## 2010-08-18 NOTE — H&P (Signed)
No interval changes    CV-RRR  Lungs-CTA bilaterally  abd-soft

## 2010-08-18 NOTE — Brief Op Note (Signed)
BRIEF OPERATIVE NOTE    Date of Procedure: 08/19/2010   Preoperative Diagnosis: NASAL DUCT STENOSIS, EPIPHORA  Postoperative Diagnosis: * No post-op diagnosis entered *    Procedure:  DACROCYSTORHINOTOMY (DCR) - DACROCYSTORHINOTOMY (DCR) WITH STENT LEFT EYE, BIOPSY OF SACK  Surgeon(s) and Role:     * Nicole Kindred, MD - Primary  Anesthesia: General   Estimated Blood Loss: <10cc  Specimens: * No specimens in log *   Findings:   Complications:none  Implants: * No implants in log *

## 2010-08-19 LAB — HCG URINE, QL. - POC: Pregnancy test,urine (POC): NEGATIVE

## 2010-08-19 MED ORDER — TRAMADOL-ACETAMINOPHEN 37.5 MG-325 MG TAB
ORAL_TABLET | ORAL | Status: AC | PRN
Start: 2010-08-19 — End: 2010-08-21

## 2010-08-19 MED ORDER — NEOMYCIN-POLYMYXIN-DEXAMETH 3.5 MG-10,000 UNIT/G-0.1 % EYE OINTMENT
3.5 mg/g-10,000 unit/g-0.1 % | Freq: Four times a day (QID) | OPHTHALMIC | Status: AC
Start: 2010-08-19 — End: 2010-09-02

## 2010-08-19 MED ORDER — CEPHALEXIN 250 MG CAP
250 mg | ORAL_CAPSULE | Freq: Four times a day (QID) | ORAL | Status: AC
Start: 2010-08-19 — End: 2010-08-24

## 2010-08-19 MED ORDER — ONDANSETRON 8 MG TAB, RAPID DISSOLVE
8 mg | ORAL_TABLET | Freq: Three times a day (TID) | ORAL | Status: AC | PRN
Start: 2010-08-19 — End: 2011-08-14

## 2010-08-19 MED ADMIN — cocaine 4 % topical solution: TOPICAL | @ 14:00:00 | NDC 00527172874

## 2010-08-19 MED ADMIN — fentaNYL citrate (pf) injection 25 mcg: INTRAVENOUS | @ 16:00:00 | NDC 24200025244

## 2010-08-19 MED ADMIN — ceFAZolin (ANCEF) 1 g in 0.9% sodium chloride (MBP/ADV) 50 mL MBP: INTRAVENOUS | @ 14:00:00 | NDC 00781345170

## 2010-08-19 MED ADMIN — oxymetazoline (AFRIN) 0.05 % nasal spray 2 Spray: NASAL | @ 13:00:00 | NDC 00904571135

## 2010-08-19 MED ADMIN — fentaNYL citrate (pf) injection 25 mcg: INTRAVENOUS | @ 15:00:00 | NDC 24200025244

## 2010-08-19 MED ADMIN — neomycin-bacitracin-polymyxin (NEOSPORIN) 3.5-400-10,000 mg-unit-unit/g ophthalmic ointment: OPHTHALMIC | @ 14:00:00 | NDC 24208078055

## 2010-08-19 MED ADMIN — traMADol-acetaminophen (ULTRACET) per tablet 1 Tab: ORAL | @ 16:00:00 | NDC 00172635900

## 2010-08-19 MED ADMIN — bupivacaine-EPINEPHrine (PF) 0.5 %-1:200,000 5 mL, lidocaine-EPINEPHrine 2 %-1:100,000 5 mL, hyaluronidase 150 unit/mL 1 mL iv solution: SUBCUTANEOUS | @ 14:00:00 | NDC 00548909000

## 2010-08-19 MED ADMIN — lactated ringers infusion 1,000 mL: INTRAVENOUS | @ 13:00:00 | NDC 00409795309

## 2010-08-19 NOTE — Op Note (Signed)
Name:      [name]                                          Surgeon:        Natalie Hays, M.D.  Account #: [id number]                 Surgery Date:   08/19/2010  DOB:       May 08, 1966  Age:       45                           Location:       [facility]                                 OPERATIVE REPORT          PREOPERATIVE DIAGNOSES:  1. Nasolacrimal duct obstruction, left eye.  2. Tearing, left eye.  3. Dacryocystitis, left eye.    POSTOPERATIVE DIAGNOSES:  1. Nasolacrimal duct obstruction, left eye.  2. Tearing, left eye.  3. Dacryocystitis, left eye.    OPERATIVE PROCEDURE:  1. Dacryocystorhinostomy, left eye.  2. Probing nasolacrimal sac and insertion of Crawford stent.  3. Partial excision of turbinate, left side.  4. Biopsy nasolacrimal sac, left eye.    SURGEON:  Natalie Seashore III, MD    SPECIMEN:  Left nasolacrimal sac    ANESTHESIA:  General.    COMPLICATIONS:  None.    INDICATIONS: The patient is a 45 year old woman with a history of  obstruction of the left nasolacrimal sac. This has been refractory to  conservative therapy.    DESCRIPTION OF PROCEDURE:  After informed consent was obtained, the patient was taken to the operating  room and placed on the operating table in the supine position. General  anesthesia was induced by the anesthesia provider. Lidocaine 2% with  epinephrine was used to infiltrate the patient's medial canthal region. An  ethmoidal block was also performed. Then, two 4% cocaine-soaked cottonoids  were used to pack the patient's left side of the nose. The patient's face  was prepped and draped in the usual sterile fashion.    First, a #15 blade was used to make an incision along the previously marked  medial canthal incision line on the left side. Two four-prong retractors  were used to retract the incision. [name] scissors were used to spread to  bone. [name] elevator was used to incise the periosteum and reflect it  medially and laterally. The lateral  reflection was used to encompass the  nasolacrimal sac, which was pulled laterally. There was a calculus in the  nasolacrimal sac, which was removed and sent along with the nasolacrimal  sac biopsy. Then the nasolacrimal sac fossa was infractured with a pair of  hemostats. Then, Kerrison rongeurs were used to create a large bony ostium  on the left side. Next, the packing was removed. The remaining nasal mucosa  was infiltrated with local anesthesia to help with hemostasis. A #11 blade  was used to create flaps from the anterior nasolacrimal sac and nasal  mucosa. It was noted at that time that there was a portion of the middle  turbinate interfering with the ostium. This was infiltrated with local  anesthesia and removed with hemostats. The posterior aspect of the  nasolacrimal sac and nasal mucosa was removed with hemostats. This section  of the nasolacrimal sac was sent to pathology for permanent sections as a  biopsy. Then a punctal dilator was used to dilate the upper and lower  punctum on the left side. The nasolacrimal duct probe was passed easily in  to the ostium. Then Crawford probes and stents were passed through the  upper and lower canalicular system through the nasolacrimal sac and were  retrieved through the nose with the groove director. Next, a 6-0 silk  suture was tied around the proximal end of the tubes to prevent retrograde  movement of the stents. Then, a 4-0 Vicryl suture was used to sew the  anterior flap of the nasal mucosa to the anterior flap of the nasolacrimal  sac. Then, the subcutaneous tissues of the skin incision were closed with  two buried 6-0 chromic sutures. The skin itself was closed with a series of  interrupted 6-0 plain gut sutures. The probes were then removed from the  stents and the stents were tied with square knots and allowed to retract  into the patient's nose.    The patient tolerated the procedure well without complication. The  patient's face was cleaned and dried.  Antibiotic ointment was placed on the  incision and in the left eye. A pressure patch was placed on the left eye.  The patient was taken to the recovery room in stable condition. Estimated  blood loss was less than 10 mL.    ESTIMATED BLOOD LOSS:  Less than 5 mL.    SPECIMEN:          [name], M.D.    cc:   Natalie Morgan Farm, M.D.      WHB/wmx; D: 08/19/2010 10:44 A; T: 08/19/2010  5:02 P; Doc# [id number]; Job#  [id number]

## 2010-08-19 NOTE — Other (Signed)
Crawford tube placed in left naso-lacrimal tract.

## 2010-08-19 NOTE — Op Note (Signed)
Name:      [name]                                          Surgeon:        Carmin Britt, M.D.  Account #: [id number]                 Surgery Date:   08/19/2010  DOB:       1965-09-21  Age:       45                           Location:       [facility]                                 OPERATIVE REPORT          PREOPERATIVE DIAGNOSES:  1. Nasolacrimal duct obstruction, left eye.  2. Tearing, left eye.  3. Dacryocystitis, left eye.    POSTOPERATIVE DIAGNOSES:  1. Nasolacrimal duct obstruction, left eye.  2. Tearing, left eye.  3. Dacryocystitis, left eye.    OPERATIVE PROCEDURE:  1. Dacryocystorhinostomy, left eye.  2. Probing nasolacrimal sac and insertion of Crawford stent.  3. Partial excision of turbinate, left side.  4. Biopsy nasolacrimal sac, left eye.    SURGEON:  Jenita Seashore III, MD    SPECIMEN:  Left nasolacrimal sac    ANESTHESIA:  General.    COMPLICATIONS:  None.    INDICATIONS: The patient is a 45 year old woman with a history of  obstruction of the left nasolacrimal sac. This has been refractory to  conservative therapy.    DESCRIPTION OF PROCEDURE:  After informed consent was obtained, the patient was taken to the operating  room and placed on the operating table in the supine position. General  anesthesia was induced by the anesthesia provider. Lidocaine 2% with  epinephrine was used to infiltrate the patient's medial canthal region. An  ethmoidal block was also performed. Then, two 4% cocaine-soaked cottonoids  were used to pack the patient's left side of the nose. The patient's face  was prepped and draped in the usual sterile fashion.    First, a #15 blade was used to make an incision along the previously marked  medial canthal incision line on the left side. Two four-prong retractors  were used to retract the incision. [name] scissors were used to spread to  bone. [name] elevator was used to incise the periosteum and reflect it   medially and laterally. The lateral reflection was used to encompass the  nasolacrimal sac, which was pulled laterally. There was a calculus in the  nasolacrimal sac, which was removed and sent along with the nasolacrimal  sac biopsy. Then the nasolacrimal sac fossa was infractured with a pair of  hemostats. Then, Kerrison rongeurs were used to create a large bony ostium  on the left side. Next, the packing was removed. The remaining nasal mucosa  was infiltrated with local anesthesia to help with hemostasis. A #11 blade  was used to create flaps from the anterior nasolacrimal sac and nasal  mucosa. It was noted at that time that there was a portion of the middle  turbinate interfering with the ostium. This was infiltrated with local  anesthesia and removed with hemostats. The posterior aspect of the  nasolacrimal sac and nasal mucosa was removed with hemostats. This section  of the nasolacrimal sac was sent to pathology for permanent sections as a  biopsy. Then a punctal dilator was used to dilate the upper and lower  punctum on the left side. The nasolacrimal duct probe was passed easily in  to the ostium. Then Crawford probes and stents were passed through the  upper and lower canalicular system through the nasolacrimal sac and were  retrieved through the nose with the groove director. Next, a 6-0 silk  suture was tied around the proximal end of the tubes to prevent retrograde  movement of the stents. Then, a 4-0 Vicryl suture was used to sew the  anterior flap of the nasal mucosa to the anterior flap of the nasolacrimal  sac. Then, the subcutaneous tissues of the skin incision were closed with  two buried 6-0 chromic sutures. The skin itself was closed with a series of  interrupted 6-0 plain gut sutures. The probes were then removed from the  stents and the stents were tied with square knots and allowed to retract  into the patient's nose.    The patient tolerated the procedure well without complication. The   patient's face was cleaned and dried. Antibiotic ointment was placed on the  incision and in the left eye. A pressure patch was placed on the left eye.  The patient was taken to the recovery room in stable condition. Estimated  blood loss was less than 10 mL.    ESTIMATED BLOOD LOSS:  Less than 5 mL.    SPECIMEN:          Carmin Whitehall, M.D.    cc:   Carmin Hancock, M.D.      WHB/wmx; D: 08/19/2010 10:44 A; T: 08/19/2010  5:02 P; Doc# [id number]; Job#  [id number]

## 2010-08-19 NOTE — Progress Notes (Signed)
Post-Anesthesia Evaluation and Assessment    Patient: [name] MRN: [id number]  SSN: [id number]    Date of Birth: [birth year]  Age: 45 y.o.  Sex: female       Cardiovascular Function/Vital Signs  Visit Vitals   Item Reading   ??? BP 134/90   ??? Pulse 91   ??? Temp 97 ??F (36.1 ??C)   ??? Resp 14   ??? Ht 5\' 7"  (1.702 m)   ??? Wt 133 lb (60.328 kg)   ??? BMI 20.83 kg/m2   ??? SpO2 99%       Patient is status post General anesthesia for  DACROCYSTORHINOTOMY (DCR) - DACROCYSTORHINOTOMY (DCR) WITH STENT LEFT EYE, BIOPSY OF SACK.    Nausea/Vomiting: None    Postoperative hydration reviewed and adequate.    Pain:  Pain Scale 1: Numeric (0 - 10) (08/19/10 1231)  Pain Intensity 1: 5 (08/19/10 1231)   Managed    Neurological Status:   Neuro (WDL): Within Defined Limits (08/19/10 1155)   At baseline    Mental Status and Level of Consciousness: Alert and oriented to person, place, and time    Pulmonary Status:   O2 Device: Room air (08/19/10 1231)   Adequate oxygenation and airway patent    Complications related to anesthesia: None    Post-anesthesia assessment completed. No concerns      Signed By: Bjorn Pippin, MD     August 19, 2010

## 2010-12-22 LAB — AMB POC URINALYSIS DIP STICK AUTO W/O MICRO
Bilirubin (UA POC): NEGATIVE
Glucose (UA POC): NEGATIVE
Ketones (UA POC): NEGATIVE
Leukocyte esterase (UA POC): NEGATIVE
Nitrites (UA POC): NEGATIVE
Protein (UA POC): NEGATIVE mg/dL
Specific gravity (UA POC): 1.025 (ref 1.001–1.035)
Urobilinogen (UA POC): 0.2
pH (UA POC): 6 (ref 4.6–8.0)

## 2010-12-22 MED ORDER — CIPROFLOXACIN 500 MG TAB
500 mg | ORAL_TABLET | Freq: Two times a day (BID) | ORAL | Status: AC
Start: 2010-12-22 — End: 2010-12-29

## 2010-12-22 NOTE — Progress Notes (Signed)
Chief Complaint   Patient presents with   ??? Urinary Pain     flank pain, low grade temp, blood in urine, and burning.

## 2010-12-22 NOTE — Patient Instructions (Signed)
Kidney Infection: After Your Visit  Your Care Instructions  A kidney infection (pyelonephritis) is a type of urinary tract infection, or UTI. Most UTIs are bladder infections. Kidney infections tend to make people much sicker than bladder infections do. A kidney infection is also more serious because it can cause lasting damage if it is not treated quickly.  Follow-up care is a key part of your treatment and safety. Be sure to make and go to all appointments, and call your doctor if you are having problems. It???s also a good idea to know your test results and keep a list of the medicines you take.  How can you care for yourself at home?  ?? Take your antibiotics as directed. Do not stop taking them just because you feel better. You need to take the full course of antibiotics.   ?? Drink plenty of water, enough so that your urine is light yellow or clear like water. This may help wash out bacteria that are causing the infection. If you have kidney, heart, or liver disease and have to limit fluids, talk with your doctor before you increase the amount of fluids you drink.   ?? Urinate often. Try to empty your bladder each time.   ?? To relieve pain, take a hot shower or lay a heating pad (set on low) over your lower belly. Never go to sleep with a heating pad in place. Put a thin cloth between the heating pad and your skin.   To help prevent kidney infections  ?? Drink plenty of water each day. This helps you urinate often, which clears bacteria from your system. If you have kidney, heart, or liver disease and have to limit fluids, talk with your doctor before you increase the amount of fluids you drink.   ?? Include cranberry juice in your diet.   ?? Urinate when you have the urge. Do not hold your urine for a long time. Urinate before you go to sleep.    ?? If you have symptoms of a bladder infection, such as burning when you urinate or having to urinate often, call your doctor so you can treat the problem before it gets worse. If you do not treat a bladder infection quickly, it can spread to the kidney.   ?? Men should keep the tip of the penis clean.   If you are a woman, keep these ideas in mind:  ?? Urinate right after you have sex.   ?? Change sanitary pads often. Avoid douches, feminine hygiene sprays, and other feminine hygiene products that have deodorants.   ?? After going to the bathroom, wipe from front to back.   When should you call for help?  Call your doctor now or seek immediate medical care if:  ?? You have increasing pain in your back just below the rib cage. This is called flank pain.   ?? You have a new or higher fever and chills.   ?? You are vomiting or nauseated.   Watch closely for changes in your health, and be sure to contact your doctor if:  ?? Symptoms, such as burning when you urinate, get worse or get better but then come back.   ?? You are not getting better after 2 days.     Where can you learn more?    Go to http://www.healthwise.net/[facility]   Enter E877 in the search box to learn more about "Kidney Infection: After Your Visit."    ?? 2006-2012 [employer]. Care instructions   adapted under license by [employer] (which disclaims liability or warranty for this information). This care instruction is for use with your licensed healthcare professional. If you have questions about a medical condition or this instruction, always ask your healthcare professional. [employer], Inc[employer] disclaims any warranty or liability for your use of this information.  Content Version: 9.3.73196; Last Revised: March 10, 2010

## 2010-12-22 NOTE — Progress Notes (Signed)
HISTORY OF PRESENT ILLNESS  [name] is a 45 y.o. female.  HPI  Pt presents with one week h/o alternating flank pain, low grade fever to 100 initially, but no fever for past 3-4 days, Noted hematuria for past few days. Nausea and vomitting 2 days ago. States she has h/o pyelonephritis in 2006 and kidney stone in 2008, "had them flushed out". Has been drinking a lot of water and cranberry juice to try to get rid of sx.    Review of Systems   Genitourinary: Positive for dysuria, urgency, frequency and hematuria. Negative for flank pain.   Musculoskeletal: Positive for back pain.        Lower  back     Allergies   Allergen Reactions   ??? Bee Sting (Sting, Bee) Anaphylaxis      BP 120/73   Pulse 74   Temp(Src) 98 ??F (36.7 ??C) (Oral)   Resp 18   Ht 5\' 7"  (1.702 m)   Wt 134 lb (60.782 kg)   BMI 20.99 kg/m2   LMP 12/08/2010   Past Medical History   Diagnosis Date   ??? Hypertension    ??? GERD (gastroesophageal reflux disease)    ??? Endometriosis    ??? Migraine    ??? Other ill-defined conditions      UNCONTROLLED VAG BLEEDING 3-6/08-RECEIVED LUPRON INJECTIONS FOR   ??? Calculus of kidney       History     Social History   ??? Marital Status: Unknown     Spouse Name: N/A     Number of Children: N/A   ??? Years of Education: N/A     Social History Main Topics   ??? Smoking status: Never Smoker    ??? Smokeless tobacco: Never Used   ??? Alcohol Use: No   ??? Drug Use:    ??? Sexually Active: Yes     Birth Control/ Protection: Pill     Other Topics Concern   ??? Not on file     Social History Narrative   ??? No narrative on file      Past Surgical History   Procedure Date   ??? Hx tonsil and adenoidectomy 1968   ??? Hx appendectomy 1992   ??? Hx other surgical 2001     BCCA EXCISION-2 SITES   ??? Hx gyn 2002     PELVIC LAP WITH REMOVAL ENDOMETRIOSIS   ??? Hx orthopaedic 1983     L KNEE TENDON/LIGAMENT RELEASE   ??? Hx orthopaedic 1992     L KNEE ARTHROSCOPY & MENISCUS REPAIR   ??? Hx orthopaedic 1998     L5-S1 LAM/DISKECTOMY   ??? Hx orthopaedic 2000      L4,5 LAM/DISKECTOMY      Current Outpatient Prescriptions   Medication Sig Dispense Refill   ??? Cholecalciferol, Vitamin D3, (VITAMIN D3) 1,000 unit Cap Take  by mouth.         ??? ciprofloxacin (CIPRO) 500 mg tablet Take 1 Tab by mouth two (2) times a day for 7 days.  14 Tab  0   ??? potassium chloride (KLOR-CON M20) 20 mEq tablet Take  by mouth three (3) times daily.         ??? magnesium 500 mg Tab Take  by mouth daily.         ??? cyanocobalamin (VITAMIN B-12) 1,000 mcg tablet Take 1,000 mcg by mouth daily.         ??? MULTIVITAMIN PO Take  by mouth daily.         ???  saccharomyces boulardii (FLORASTOR) 250 mg capsule Take 250 mg by mouth daily.         ??? ALPRAZolam (XANAX) 0.25 mg tablet Take  by mouth three (3) times daily as needed. TAKES 1/2 TO 1 TAB TID AS NEEDED        ??? butalbital-acetaminophen-caffeine (FIORICET) 50-325-40 mg per tablet Take 1 Tab by mouth every four (4) hours as needed.    Indications: MIGRAINE       ??? ibuprofen (MOTRIN) 800 mg tablet Take  by mouth every twelve (12) hours as needed.         ??? valsartan (DIOVAN) 160 mg tablet Take  by mouth every evening.       ??? amlodipine (NORVASC) 2.5 mg tablet Take  by mouth daily.       ??? triamterene-hydrochlorothiazide (DYAZIDE) 37.5-25 mg per capsule Take  by mouth daily.       ??? citalopram (CELEXA) 20 mg tablet Take  by mouth every evening.       ??? rabeprazole (ACIPHEX) 20 mg tablet Take 20 mg by mouth daily.       ??? cetirizine (ZYRTEC) 10 mg tablet Take  by mouth daily.       ??? aspirin delayed-release 81 mg tablet Take  by mouth daily.       ??? L-Norgest&E Estradiol-E Estrad (SEASONIQUE) 0.15 mg-30 mcg (84)/10 mcg (7) Take  by mouth every evening.       ??? epinephrine (EPIPEN) 0.3 mg/0.3 mL injection 0.3 mg by IntraMUSCular route once as needed.       ??? ondansetron (ZOFRAN ODT) 8 mg disintegrating tablet Take 1 Tab by mouth every eight (8) hours as needed for Nausea for 360 days.  4 Tab  3    ??? METHOCARBAMOL (ROBAXIN PO) Take  by mouth three (3) times daily as needed.                  Physical Exam   Nursing note and vitals reviewed.  Constitutional: She is oriented to person, place, and time. She appears well-developed and well-nourished.   HENT:   Head: Normocephalic and atraumatic.   Eyes: Conjunctivae are normal.   Neck: Neck supple.   Cardiovascular: Normal rate.    Pulmonary/Chest: Effort normal.   Abdominal: Soft. Bowel sounds are normal.        No CVAT, mild suprapubic tenderness.  + mild tenderness over right SI joint.   Neurological: She is alert and oriented to person, place, and time.   Skin: Skin is warm and dry.   Psychiatric: She has a normal mood and affect.       ASSESSMENT and PLAN  1. Pyelonephritis  ciprofloxacin (CIPRO) 500 mg tablet, CULTURE, URINE   2. Dysuria  AMB POC URINALYSIS DIP STICK AUTO W/O MICRO     Will treat for pyelo and perform urine culture given urinary history and current symptomatology.  Advised pt to recheck urine in 10-14 dats or return to office if symptoms worsen/change/persist.   Discussed expected course/resolution/complications of diagnosis in detail with patient.   Medication risks/benefits/costs/interactions/alternatives discussed with patient.   Pt was given an after visit summary which includes her diagnoses, current medications & vitals.   Pt expressed understanding with the diagnosis and plan.

## 2010-12-24 LAB — CULTURE, URINE

## 2010-12-25 NOTE — Progress Notes (Signed)
Quick Note:    Culture negative. Left message for pt to call back. I wanted to see how she is feeling.  ______

## 2012-06-29 NOTE — Telephone Encounter (Signed)
Chart opened by mistake.

## 2015-12-24 HISTORY — PX: UMBILICAL HERNIA REPAIR: SHX196

## 2015-12-25 ENCOUNTER — Ambulatory Visit
Admit: 2015-12-25 | Discharge: 2015-12-25 | Payer: PRIVATE HEALTH INSURANCE | Attending: Surgical Oncology | Primary: Internal Medicine

## 2015-12-25 DIAGNOSIS — K429 Umbilical hernia without obstruction or gangrene: Secondary | ICD-10-CM

## 2015-12-25 NOTE — Progress Notes (Signed)
Surgery Consult    Subjective:      [name] is a 50 y.o. Caucasian female who presents to the office for evaluation of an umbilical hernia. She was born with umbilical hernia. Never had it repaired. She has gained 10 lbs since 05/2015. Now her belly button is protruding and she thinks it may be due to her hernia. She denies any pain or other abnormal symptoms with the hernia. Pt is otherwise healthy with no other complaints.      Chief Complaint   Patient presents with   ??? Hernia (Non Specific)     New Patient s/p Evaluation for Hernia      Past Medical History:   Diagnosis Date   ??? Calculus of kidney    ??? Endometriosis    ??? GERD (gastroesophageal reflux disease)    ??? Hypertension    ??? Migraine    ??? Other ill-defined conditions     UNCONTROLLED VAG BLEEDING 3-6/08-RECEIVED LUPRON INJECTIONS FOR      Past Surgical History:   Procedure Laterality Date   ??? HX APPENDECTOMY  1992   ??? HX GYN  2002    PELVIC LAP WITH REMOVAL ENDOMETRIOSIS   ??? HX ORTHOPAEDIC  1983    L KNEE TENDON/LIGAMENT RELEASE   ??? HX ORTHOPAEDIC  1992    L KNEE ARTHROSCOPY & MENISCUS REPAIR   ??? HX ORTHOPAEDIC  1998    L5-S1 LAM/DISKECTOMY   ??? HX ORTHOPAEDIC  2000    L4,5 LAM/DISKECTOMY   ??? HX OTHER SURGICAL  2001    BCCA EXCISION-2 SITES   ??? HX TONSIL AND ADENOIDECTOMY  1968      Social History   Substance Use Topics   ??? Smoking status: Never Smoker   ??? Smokeless tobacco: Never Used   ??? Alcohol use No      Family History   Problem Relation Age of Onset   ??? Hypertension Father    ??? Heart Disease Father    ??? Cancer Father    ??? Post-op Nausea/Vomiting Mother    ??? Cancer Mother       Current Outpatient Prescriptions   Medication Sig   ??? norethindrone (MICRONOR) 0.35 mg tab Take  by mouth.   ??? Cholecalciferol, Vitamin D3, (VITAMIN D3) 1,000 unit Cap Take  by mouth.     ??? magnesium 500 mg Tab Take  by mouth daily.     ??? cyanocobalamin (VITAMIN B-12) 1,000 mcg tablet Take 1,000 mcg by mouth daily.     ??? MULTIVITAMIN PO Take  by mouth daily.      ??? saccharomyces boulardii (FLORASTOR) 250 mg capsule Take 250 mg by mouth daily.     ??? ALPRAZolam (XANAX) 0.25 mg tablet Take  by mouth three (3) times daily as needed. TAKES 1/2 TO 1 TAB TID AS NEEDED    ??? butalbital-acetaminophen-caffeine (FIORICET) 50-325-40 mg per tablet Take 1 Tab by mouth every four (4) hours as needed.    Indications: MIGRAINE   ??? ibuprofen (MOTRIN) 800 mg tablet Take  by mouth every twelve (12) hours as needed.     ??? METHOCARBAMOL (ROBAXIN PO) Take  by mouth three (3) times daily as needed.     ??? valsartan (DIOVAN) 160 mg tablet Take  by mouth every evening.   ??? amlodipine (NORVASC) 2.5 mg tablet Take  by mouth daily.   ??? triamterene-hydrochlorothiazide (DYAZIDE) 37.5-25 mg per capsule Take  by mouth daily.   ??? rabeprazole (ACIPHEX) 20 mg tablet   Take 20 mg by mouth daily.   ??? cetirizine (ZYRTEC) 10 mg tablet Take  by mouth daily.   ??? aspirin delayed-release 81 mg tablet Take  by mouth daily.   ??? epinephrine (EPIPEN) 0.3 mg/0.3 mL injection 0.3 mg by IntraMUSCular route once as needed.   ??? potassium chloride (KLOR-CON M20) 20 mEq tablet Take  by mouth three (3) times daily.     ??? citalopram (CELEXA) 20 mg tablet Take  by mouth every evening.   ??? L-Norgest&E Estradiol-E Estrad (SEASONIQUE) 0.15 mg-30 mcg (84)/10 mcg (7) Take  by mouth every evening.     No current facility-administered medications for this visit.       Allergies   Allergen Reactions   ??? Bee Sting [Sting, Bee] Anaphylaxis        Review of Systems:    A comprehensive review of systems was negative except for that written in the History of Present Illness.    Objective:     Visit Vitals   ??? BP 116/70 (BP 1 Location: Right arm, BP Patient Position: Sitting)   ??? Pulse 68   ??? Temp 97.9 ??F (36.6 ??C)   ??? Resp 17   ??? Ht 5\' 7"  (1.702 m)   ??? Wt 152 lb (68.9 kg)   ??? SpO2 97%   ??? BMI 23.81 kg/m2         Physical Exam:  General appearance: alert, cooperative, no distress, appears stated age  Lungs: clear to auscultation bilaterally   Heart: regular rate and rhythm, S1, S2 normal, no murmur, click, rub or gallop  Abdomen: Bowel sounds normal. No masses,  no organomegaly, 1 cm umbilical defect that is exquisitely tender      Assessment:       ICD-10-CM ICD-9-CM    1. Umbilical hernia without obstruction and without gangrene K42.9 553.1      Plan:     Repair of her umbilical hernia as an outpatient    Written by Janifer Adie, Scribekick, dictated by Clinton Sawyer, MD.    Signed By: Tyrone Nine. Jimmey Ralph, MD     December 25, 2015

## 2015-12-25 NOTE — H&P (View-Only) (Signed)
Surgery Consult    Subjective:      [name] is a 50 y.o. Caucasian female who presents to the office for evaluation of an umbilical hernia. She was born with umbilical hernia. Never had it repaired. She has gained 10 lbs since 05/2015. Now her belly button is protruding and she thinks it may be due to her hernia. She denies any pain or other abnormal symptoms with the hernia. Pt is otherwise healthy with no other complaints.      Chief Complaint   Patient presents with   ??? Hernia (Non Specific)     New Patient s/p Evaluation for Hernia      Past Medical History:   Diagnosis Date   ??? Calculus of kidney    ??? Endometriosis    ??? GERD (gastroesophageal reflux disease)    ??? Hypertension    ??? Migraine    ??? Other ill-defined conditions     UNCONTROLLED VAG BLEEDING 3-6/08-RECEIVED LUPRON INJECTIONS FOR      Past Surgical History:   Procedure Laterality Date   ??? HX APPENDECTOMY  1992   ??? HX GYN  2002    PELVIC LAP WITH REMOVAL ENDOMETRIOSIS   ??? HX ORTHOPAEDIC  1983    L KNEE TENDON/LIGAMENT RELEASE   ??? HX ORTHOPAEDIC  1992    L KNEE ARTHROSCOPY & MENISCUS REPAIR   ??? HX ORTHOPAEDIC  1998    L5-S1 LAM/DISKECTOMY   ??? HX ORTHOPAEDIC  2000    L4,5 LAM/DISKECTOMY   ??? HX OTHER SURGICAL  2001    BCCA EXCISION-2 SITES   ??? HX TONSIL AND ADENOIDECTOMY  1968      Social History   Substance Use Topics   ??? Smoking status: Never Smoker   ??? Smokeless tobacco: Never Used   ??? Alcohol use No      Family History   Problem Relation Age of Onset   ??? Hypertension Father    ??? Heart Disease Father    ??? Cancer Father    ??? Post-op Nausea/Vomiting Mother    ??? Cancer Mother       Current Outpatient Prescriptions   Medication Sig   ??? norethindrone (MICRONOR) 0.35 mg tab Take  by mouth.   ??? Cholecalciferol, Vitamin D3, (VITAMIN D3) 1,000 unit Cap Take  by mouth.     ??? magnesium 500 mg Tab Take  by mouth daily.     ??? cyanocobalamin (VITAMIN B-12) 1,000 mcg tablet Take 1,000 mcg by mouth daily.     ??? MULTIVITAMIN PO Take  by mouth daily.      ??? saccharomyces boulardii (FLORASTOR) 250 mg capsule Take 250 mg by mouth daily.     ??? ALPRAZolam (XANAX) 0.25 mg tablet Take  by mouth three (3) times daily as needed. TAKES 1/2 TO 1 TAB TID AS NEEDED    ??? butalbital-acetaminophen-caffeine (FIORICET) 50-325-40 mg per tablet Take 1 Tab by mouth every four (4) hours as needed.    Indications: MIGRAINE   ??? ibuprofen (MOTRIN) 800 mg tablet Take  by mouth every twelve (12) hours as needed.     ??? METHOCARBAMOL (ROBAXIN PO) Take  by mouth three (3) times daily as needed.     ??? valsartan (DIOVAN) 160 mg tablet Take  by mouth every evening.   ??? amlodipine (NORVASC) 2.5 mg tablet Take  by mouth daily.   ??? triamterene-hydrochlorothiazide (DYAZIDE) 37.5-25 mg per capsule Take  by mouth daily.   ??? rabeprazole (ACIPHEX) 20 mg tablet  Take 20 mg by mouth daily.   ??? cetirizine (ZYRTEC) 10 mg tablet Take  by mouth daily.   ??? aspirin delayed-release 81 mg tablet Take  by mouth daily.   ??? epinephrine (EPIPEN) 0.3 mg/0.3 mL injection 0.3 mg by IntraMUSCular route once as needed.   ??? potassium chloride (KLOR-CON M20) 20 mEq tablet Take  by mouth three (3) times daily.     ??? citalopram (CELEXA) 20 mg tablet Take  by mouth every evening.   ??? L-Norgest&E Estradiol-E Estrad (SEASONIQUE) 0.15 mg-30 mcg (84)/10 mcg (7) Take  by mouth every evening.     No current facility-administered medications for this visit.       Allergies   Allergen Reactions   ??? Bee Sting [Sting, Bee] Anaphylaxis        Review of Systems:    A comprehensive review of systems was negative except for that written in the History of Present Illness.    Objective:     Visit Vitals   ??? BP 116/70 (BP 1 Location: Right arm, BP Patient Position: Sitting)   ??? Pulse 68   ??? Temp 97.9 ??F (36.6 ??C)   ??? Resp 17   ??? Ht 5\' 7"  (1.702 m)   ??? Wt 152 lb (68.9 kg)   ??? SpO2 97%   ??? BMI 23.81 kg/m2         Physical Exam:  General appearance: alert, cooperative, no distress, appears stated age  Lungs: clear to auscultation bilaterally   Heart: regular rate and rhythm, S1, S2 normal, no murmur, click, rub or gallop  Abdomen: Bowel sounds normal. No masses,  no organomegaly, 1 cm umbilical defect that is exquisitely tender      Assessment:       ICD-10-CM ICD-9-CM    1. Umbilical hernia without obstruction and without gangrene K42.9 553.1      Plan:     Repair of her umbilical hernia as an outpatient    Written by Janifer Adie, Scribekick, dictated by Clinton Sawyer, MD.    Signed By: Tyrone Nine. Jimmey Ralph, MD     December 25, 2015

## 2015-12-25 NOTE — Progress Notes (Signed)
1. Have you been to the ER, urgent care clinic since your last visit?  Hospitalized since your last visit? No     2. Have you seen or consulted any other health care providers outside of the [facility] since your last visit?  Include any pap smears or colon screening. No

## 2015-12-30 NOTE — Telephone Encounter (Signed)
I left message on patients home # that forms were ready for P/U in 406. I called her work # but she was not in.

## 2015-12-30 NOTE — Telephone Encounter (Signed)
-----   Message from [place] Griffith sent at 12/30/2015 10:12 AM EDT -----  Regarding: disability forms left   Contact: [phone]  Please call pt when forms are ready and she will pick them up.    Thanks,  vg

## 2016-01-09 ENCOUNTER — Inpatient Hospital Stay: Admit: 2016-01-09 | Payer: PRIVATE HEALTH INSURANCE | Primary: Internal Medicine

## 2016-01-09 ENCOUNTER — Inpatient Hospital Stay: Admit: 2016-01-09 | Payer: PRIVATE HEALTH INSURANCE | Attending: Surgical Oncology | Primary: Internal Medicine

## 2016-01-09 DIAGNOSIS — Z01818 Encounter for other preprocedural examination: Secondary | ICD-10-CM

## 2016-01-09 LAB — EKG 12-LEAD
Atrial Rate: 82 {beats}/min
Diagnosis: NORMAL
P Axis: 48 degrees
P-R Interval: 144 ms
Q-T Interval: 368 ms
QRS Duration: 70 ms
QTc Calculation (Bazett): 429 ms
R Axis: 61 degrees
T Axis: 45 degrees
Ventricular Rate: 82 {beats}/min

## 2016-01-09 LAB — EKG, 12 LEAD, INITIAL
Atrial Rate: 82 {beats}/min
Calculated P Axis: 48 degrees
Calculated R Axis: 61 degrees
Calculated T Axis: 45 degrees
Diagnosis: NORMAL
P-R Interval: 144 ms
Q-T Interval: 368 ms
QRS Duration: 70 ms
QTC Calculation (Bezet): 429 ms
Ventricular Rate: 82 {beats}/min

## 2016-01-09 NOTE — Other (Signed)
PATIENT GIVEN WRITTEN INSTRUCTIONS TO GO TO THE IMAGING CENTER[facility]CANCER CENTER [address] TO HAVE CHEST XRAY COMPLETED.Pre-Operative Instructions    DO NOT EAT OR DRINK ANYTHING AFTER MIDNIGHT THE NIGHT BEFORE SURGERY.    Patient given surgical site infection information FAQs handout and hand hygiene tips sheet.Pre-operative instructions reviewed and patient verbalizes understanding of instructions. Patient has been given the opportunity to ask additional questions.Given Skin prep chlorhexidine wipes-given written and verbal instructions on use.

## 2016-01-10 DIAGNOSIS — S39012A Strain of muscle, fascia and tendon of lower back, initial encounter: Secondary | ICD-10-CM

## 2016-01-10 NOTE — ED Provider Notes (Signed)
HPI Comments: 50 y.o. female with past medical history significant for HTN, GERD, endometriosis, migraines, kidney stones, umbilical hernia, DDD s/p multiple spinal surgeries, who presents from the PICU with chief complaint of sudden onset of right hip pain while at work tonight. 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 At that time she felt a pain "shoot across" the right hip down the right leg. Pt states that she still has 10/10 right lower back and right hip pain present at this time that is "sharp and shooting". The pain is exacerbated with any type of movement. She additionally c/o nausea secondary to the severe pain, but she denies any numbness or paresthesias. Of note, pt reports a h/o "two herniated discs" in her back. She specifically denies any recent illnesses, fevers, chills, and vomiting. There are no other acute medical concerns at this time.    Social hx: - Tobacco, - EtOH, - Illicit Drug Use   PCP: Calvert CantorKevin C Harrison, MD     Note written by Guadlupe SpanishJessica A.Ravensbergen, Scribe, as dictated by Miquel DunnLakshmi S Asbury Hair, MD 11:06 PM     The history is provided by the patient. No language interpreter was used.        Past Medical History:   Diagnosis Date   ??? Calculus of kidney    ??? Cancer (HCC)     BASAL CELL CARCINOMAS REMOVED   ??? Endometriosis    ??? GERD (gastroesophageal reflux disease)    ??? Hypertension    ??? Migraine    ??? Other ill-defined conditions     UNCONTROLLED VAG BLEEDING 3-6/08-RECEIVED LUPRON INJECTIONS FOR   ??? Umbilical hernia 12/25/2015       Past Surgical History:   Procedure Laterality Date   ??? HX APPENDECTOMY  1992   ??? HX GYN  2002    PELVIC LAP WITH REMOVAL ENDOMETRIOSIS   ??? HX HEENT  2005    LASIK SURGERY   ??? HX ORTHOPAEDIC  1983    L KNEE TENDON/LIGAMENT RELEASE   ??? HX ORTHOPAEDIC  1992    L KNEE ARTHROSCOPY & MENISCUS REPAIR   ??? HX ORTHOPAEDIC  1998     L5-S1 LAM/DISKECTOMY   ??? HX ORTHOPAEDIC  2000    L4,5 LAM/DISKECTOMY   ??? HX ORTHOPAEDIC  2014    RIGHT CARPAL TUNNEL,REMOVAL HARDWARE RIGHT TIBIA   ??? HX OTHER SURGICAL  2001    BCCA EXCISION-2 SITES   ??? HX OTHER SURGICAL  2017    COLPOSCOPY PROCEDURE/BIOPSIES   ??? HX TONSIL AND ADENOIDECTOMY  1968   ??? HX TONSILLECTOMY  1968    ADENOIDECTOMY   ??? HX UROLOGICAL  2003    CYSTOSCOPY,REMOVAL RENAL CALCULI         Family History:   Problem Relation Age of Onset   ??? Hypertension Father    ??? Heart Disease Father    ??? Cancer Father      SKIN   ??? Stroke Father    ??? Elevated Lipids Father    ??? Post-op Nausea/Vomiting Mother    ??? Cancer Mother    ??? Elevated Lipids Sister    ??? Asthma Sister        Social History     Social History   ??? Marital status: DIVORCED     Spouse name: N/A   ??? Number  of children: N/A   ??? Years of education: N/A     Occupational History   ??? Not on file.     Social History Main Topics   ??? Smoking status: Never Smoker   ??? Smokeless tobacco: Never Used   ??? Alcohol use No   ??? Drug use: No   ??? Sexual activity: Yes     Birth control/ protection: Pill     Other Topics Concern   ??? Not on file     Social History Narrative         ALLERGIES: Bee sting [sting, bee]    Review of Systems   Constitutional: Negative for chills and fever.   HENT: Negative for congestion, nosebleeds and rhinorrhea.    Eyes: Negative for pain and redness.   Respiratory: Negative for cough and shortness of breath.    Cardiovascular: Negative for chest pain and palpitations.   Gastrointestinal: Positive for nausea. Negative for abdominal pain and vomiting.   Genitourinary: Negative for dysuria, frequency, vaginal bleeding and vaginal pain.   Musculoskeletal: Positive for arthralgias (right hip) and back pain (right lower back). Negative for myalgias.   Skin: Negative for rash and wound.   Neurological: Negative for seizures, syncope, weakness and numbness.   Hematological: Does not bruise/bleed easily.    Psychiatric/Behavioral: Negative for agitation, confusion, dysphoric mood and suicidal ideas. The patient is not nervous/anxious.    All other systems reviewed and are negative.      Vitals:    01/10/16 2301   Pulse: (!) 122   Temp: 98.7 ??F (37.1 ??C)   SpO2: 100%   Weight: 67.6 kg (149 lb)   Height: 5\' 7"  (1.702 m)            Physical Exam   Constitutional: She is oriented to person, place, and time. She appears well-developed and well-nourished.   Patient is tearful and anxious appearing   HENT:   Head: Normocephalic and atraumatic.   Eyes: EOM are normal. Pupils are equal, round, and reactive to light.   Neck: Normal range of motion. Neck supple. No tracheal deviation present.   Cardiovascular: Normal rate, regular rhythm, normal heart sounds and intact distal pulses.    Pulmonary/Chest: Effort normal and breath sounds normal. No stridor. No respiratory distress. She has no wheezes. She has no rales. She exhibits no tenderness.   Abdominal: Soft. Bowel sounds are normal. She exhibits no distension. There is no tenderness. There is no rebound.   Musculoskeletal: She exhibits no edema or deformity.   She is holding her right hip, and the right hip and right knee are flexed.    Neurological: She is alert and oriented to person, place, and time. No cranial nerve deficit.   Bilateral patellar reflexes are symmetric.  Distal sensation intact and symmetric in bilateral lower extremities.   Skin: Skin is warm and dry. No rash noted. No pallor.   Psychiatric: She has a normal mood and affect.   Nursing note and vitals reviewed.  Note written by Guadlupe SpanishJessica A.Ravensbergen, Scribe, as dictated by Miquel DunnLakshmi S Brynlea Spindler, MD 11:06 PM      MDM  Number of Diagnoses or Management Options  Back strain, initial encounter:   Diagnosis management comments: 89y-year-old female presents with complaints of right-sided low back pain and hip pain after bending down to lift something. Patient reports history of back pain with herniated discs.  Patient is nontoxic, afebrile, appears uncomfortable, no bony tenderness to palpation, hemodynamically stable, afebrile, neurologically intact. Plan-pain  control, hip and lumbar x-ray.    X-rays show lumbar degenerative changes.       Amount and/or Complexity of Data Reviewed  Tests in the radiology section of CPT??: reviewed and ordered  Independent visualization of images, tracings, or specimens: yes    Risk of Complications, Morbidity, and/or Mortality  Presenting problems: low  Diagnostic procedures: low  Management options: low    Patient Progress  Patient progress: improved    ED Course       Procedures    PROGRESS NOTE:  1:20 AM   Pt was re-evaluated. She states that her pain is down to an 8/10 after medications in the ED. Discussed XR results and the need for follow-up with employee wellness and her neurosurgeon, Dr. Cliffton Asters. Pt expresses understanding. Will prepare for discharge.

## 2016-01-10 NOTE — ED Triage Notes (Signed)
TRAIGE NOTE:  Patient arrives from upstairs with severe back pain.  She was bending down to pick up some diapers when she hurt her back.      She is scheduled to have an abdominal hernia repair soon and she was trying to splint her stomach and she hurt her back.     Patient is in 10/10 pain.

## 2016-01-11 ENCOUNTER — Inpatient Hospital Stay
Admit: 2016-01-11 | Discharge: 2016-01-11 | Disposition: A | Discharge status: Home / Self Care | Payer: Worker's Compensation | Attending: Emergency Medicine

## 2016-01-11 ENCOUNTER — Emergency Department: Admit: 2016-01-11 | Payer: Worker's Compensation | Primary: Internal Medicine

## 2016-01-11 MED ORDER — DIAZEPAM 5 MG/ML SYRINGE
5 mg/mL | INTRAMUSCULAR | Status: AC
Start: 2016-01-11 — End: 2016-01-11
  Administered 2016-01-11: 06:00:00 via INTRAVENOUS

## 2016-01-11 MED ORDER — SODIUM CHLORIDE 0.9% BOLUS IV
0.9 % | Freq: Once | INTRAVENOUS | Status: AC
Start: 2016-01-11 — End: 2016-01-11
  Administered 2016-01-11: 04:00:00 via INTRAVENOUS

## 2016-01-11 MED ORDER — HYDROMORPHONE (PF) 1 MG/ML IJ SOLN
1 mg/mL | INTRAMUSCULAR | Status: AC
Start: 2016-01-11 — End: 2016-01-10
  Administered 2016-01-11: 03:00:00 via INTRAVENOUS

## 2016-01-11 MED ORDER — KETOROLAC TROMETHAMINE 30 MG/ML INJECTION
30 mg/mL (1 mL) | INTRAMUSCULAR | Status: DC
Start: 2016-01-11 — End: 2016-01-10

## 2016-01-11 MED ORDER — OXYCODONE-ACETAMINOPHEN 5 MG-325 MG TAB
5-325 mg | ORAL | Status: DC
Start: 2016-01-11 — End: 2016-01-10

## 2016-01-11 MED ORDER — DIAZEPAM 5 MG/ML SYRINGE
5 mg/mL | INTRAMUSCULAR | Status: AC
Start: 2016-01-11 — End: 2016-01-10
  Administered 2016-01-11: 03:00:00 via INTRAVENOUS

## 2016-01-11 MED ORDER — HYDROCODONE-ACETAMINOPHEN 5 MG-325 MG TAB
5-325 mg | ORAL_TABLET | Freq: Four times a day (QID) | ORAL | 0 refills | Status: DC | PRN
Start: 2016-01-11 — End: 2016-03-14

## 2016-01-11 MED ORDER — HYDROMORPHONE (PF) 1 MG/ML IJ SOLN
1 mg/mL | INTRAMUSCULAR | Status: AC
Start: 2016-01-11 — End: 2016-01-10
  Administered 2016-01-11: 04:00:00 via INTRAVENOUS

## 2016-01-11 MED ORDER — KETOROLAC TROMETHAMINE 30 MG/ML INJECTION
30 mg/mL (1 mL) | INTRAMUSCULAR | Status: AC
Start: 2016-01-11 — End: 2016-01-10
  Administered 2016-01-11: 03:00:00 via INTRAVENOUS

## 2016-01-11 MED FILL — DIAZEPAM 5 MG/ML SYRINGE: 5 mg/mL | INTRAMUSCULAR | Qty: 2

## 2016-01-11 MED FILL — KETOROLAC TROMETHAMINE 30 MG/ML INJECTION: 30 mg/mL (1 mL) | INTRAMUSCULAR | Qty: 1

## 2016-01-11 MED FILL — HYDROMORPHONE (PF) 1 MG/ML IJ SOLN: 1 mg/mL | INTRAMUSCULAR | Qty: 1

## 2016-01-11 MED FILL — SODIUM CHLORIDE 0.9 % IV: INTRAVENOUS | Qty: 500

## 2016-01-11 NOTE — ED Notes (Signed)
Patient verbalizes understanding of discharge instructions. Pt alert and oriented, appears in no acute distress, respirations equal and unlabored. Ambulatory upon discharge with steady gait.

## 2016-01-13 ENCOUNTER — Inpatient Hospital Stay: Payer: PRIVATE HEALTH INSURANCE

## 2016-01-13 MED ORDER — DIPHENHYDRAMINE HCL 50 MG/ML IJ SOLN
50 mg/mL | INTRAMUSCULAR | Status: DC | PRN
Start: 2016-01-13 — End: 2016-01-13

## 2016-01-13 MED ORDER — EPHEDRINE SULFATE 50 MG/ML IJ SOLN
50 mg/mL | INTRAMUSCULAR | Status: DC | PRN
Start: 2016-01-13 — End: 2016-01-13

## 2016-01-13 MED ORDER — OXYCODONE-ACETAMINOPHEN 5 MG-325 MG TAB
5-325 mg | ORAL_TABLET | ORAL | 0 refills | Status: DC | PRN
Start: 2016-01-13 — End: 2016-03-14

## 2016-01-13 MED ORDER — MIDAZOLAM 1 MG/ML IJ SOLN
1 mg/mL | INTRAMUSCULAR | Status: AC
Start: 2016-01-13 — End: ?

## 2016-01-13 MED ORDER — MIDAZOLAM 1 MG/ML IJ SOLN
1 mg/mL | INTRAMUSCULAR | Status: DC | PRN
Start: 2016-01-13 — End: 2016-01-13

## 2016-01-13 MED ORDER — SODIUM CHLORIDE 0.9 % IV
INTRAVENOUS | Status: DC
Start: 2016-01-13 — End: 2016-01-13

## 2016-01-13 MED ORDER — SODIUM CHLORIDE 0.9 % IV
INTRAVENOUS | Status: DC
Start: 2016-01-13 — End: 2016-01-13
  Administered 2016-01-13: 12:00:00 via INTRAVENOUS

## 2016-01-13 MED ORDER — ROCURONIUM 10 MG/ML IV
10 mg/mL | INTRAVENOUS | Status: DC | PRN
Start: 2016-01-13 — End: 2016-01-13
  Administered 2016-01-13: 12:00:00 via INTRAVENOUS

## 2016-01-13 MED ORDER — HYDROMORPHONE (PF) 1 MG/ML IJ SOLN
1 mg/mL | INTRAMUSCULAR | Status: DC | PRN
Start: 2016-01-13 — End: 2016-01-13

## 2016-01-13 MED ORDER — MORPHINE 4 MG/ML SYRINGE
4 mg/mL | INTRAMUSCULAR | Status: AC
Start: 2016-01-13 — End: ?

## 2016-01-13 MED ORDER — MORPHINE 4 MG/ML SYRINGE
4 mg/mL | INTRAMUSCULAR | Status: DC | PRN
Start: 2016-01-13 — End: 2016-01-13
  Administered 2016-01-13: 13:00:00 via INTRAVENOUS

## 2016-01-13 MED ORDER — ONDANSETRON (PF) 4 MG/2 ML INJECTION
4 mg/2 mL | INTRAMUSCULAR | Status: AC
Start: 2016-01-13 — End: ?

## 2016-01-13 MED ORDER — SCOPOLAMINE (1.3-1.5) MG 72 HR TRANSDERM PATCH
1 mg over 3 days | TRANSDERMAL | Status: DC | PRN
Start: 2016-01-13 — End: 2016-01-13
  Administered 2016-01-13: 11:00:00 via TRANSDERMAL

## 2016-01-13 MED ORDER — GLYCOPYRROLATE 0.2 MG/ML IJ SOLN
0.2 mg/mL | INTRAMUSCULAR | Status: DC | PRN
Start: 2016-01-13 — End: 2016-01-13
  Administered 2016-01-13: 12:00:00 via INTRAVENOUS

## 2016-01-13 MED ORDER — KETOROLAC TROMETHAMINE 30 MG/ML INJECTION
30 mg/mL (1 mL) | INTRAMUSCULAR | Status: AC
Start: 2016-01-13 — End: ?

## 2016-01-13 MED ORDER — NEOSTIGMINE METHYLSULFATE 5 MG/5 ML (1 MG/ML) IV SYRINGE
5 mg/ mL (1 mg/mL) | INTRAVENOUS | Status: AC
Start: 2016-01-13 — End: ?

## 2016-01-13 MED ORDER — HYDROCODONE-ACETAMINOPHEN 5 MG-325 MG TAB
5-325 mg | ORAL | Status: DC | PRN
Start: 2016-01-13 — End: 2016-01-13

## 2016-01-13 MED ORDER — LIDOCAINE (PF) 20 MG/ML (2 %) IJ SOLN
20 mg/mL (2 %) | INTRAMUSCULAR | Status: DC | PRN
Start: 2016-01-13 — End: 2016-01-13
  Administered 2016-01-13: 12:00:00 via INTRAVENOUS

## 2016-01-13 MED ORDER — CEFAZOLIN 2 GRAM/50 ML NS IVPB
Freq: Once | INTRAVENOUS | Status: AC
Start: 2016-01-13 — End: 2016-01-13
  Administered 2016-01-13: 12:00:00 via INTRAVENOUS

## 2016-01-13 MED ORDER — OXYCODONE-ACETAMINOPHEN 5 MG-325 MG TAB
5-325 mg | ORAL | Status: AC
Start: 2016-01-13 — End: 2016-01-13
  Administered 2016-01-13: 14:00:00 via ORAL

## 2016-01-13 MED ORDER — MORPHINE 10 MG/ML INJ SOLUTION
10 mg/ml | INTRAMUSCULAR | Status: DC | PRN
Start: 2016-01-13 — End: 2016-01-13

## 2016-01-13 MED ORDER — FENTANYL CITRATE (PF) 50 MCG/ML IJ SOLN
50 mcg/mL | INTRAMUSCULAR | Status: AC | PRN
Start: 2016-01-13 — End: 2016-01-13
  Administered 2016-01-13 (×4): via INTRAVENOUS

## 2016-01-13 MED ORDER — MEPERIDINE (PF) 25 MG/ML INJ SOLUTION
25 mg/ml | INTRAMUSCULAR | Status: DC | PRN
Start: 2016-01-13 — End: 2016-01-13

## 2016-01-13 MED ORDER — KETOROLAC TROMETHAMINE 30 MG/ML INJECTION
30 mg/mL (1 mL) | INTRAMUSCULAR | Status: DC | PRN
Start: 2016-01-13 — End: 2016-01-13
  Administered 2016-01-13: 12:00:00 via INTRAVENOUS

## 2016-01-13 MED ORDER — ONDANSETRON (PF) 4 MG/2 ML INJECTION
4 mg/2 mL | INTRAMUSCULAR | Status: DC | PRN
Start: 2016-01-13 — End: 2016-01-13
  Administered 2016-01-13: 13:00:00 via INTRAVENOUS

## 2016-01-13 MED ORDER — SODIUM CHLORIDE 0.9 % IJ SYRG
INTRAMUSCULAR | Status: DC | PRN
Start: 2016-01-13 — End: 2016-01-13

## 2016-01-13 MED ORDER — SCOPOLAMINE (1.3-1.5) MG 72 HR TRANSDERM PATCH
1 mg over 3 days | TRANSDERMAL | Status: AC
Start: 2016-01-13 — End: ?

## 2016-01-13 MED ORDER — DEXAMETHASONE SODIUM PHOSPHATE 4 MG/ML IJ SOLN
4 mg/mL | INTRAMUSCULAR | Status: DC | PRN
Start: 2016-01-13 — End: 2016-01-13
  Administered 2016-01-13: 12:00:00 via INTRAVENOUS

## 2016-01-13 MED ORDER — LIDOCAINE (PF) 20 MG/ML (2 %) IJ SOLN
20 mg/mL (2 %) | INTRAMUSCULAR | Status: AC
Start: 2016-01-13 — End: ?

## 2016-01-13 MED ORDER — FENTANYL CITRATE (PF) 50 MCG/ML IJ SOLN
50 mcg/mL | INTRAMUSCULAR | Status: AC
Start: 2016-01-13 — End: ?

## 2016-01-13 MED ORDER — FENTANYL CITRATE (PF) 50 MCG/ML IJ SOLN
50 mcg/mL | INTRAMUSCULAR | Status: DC | PRN
Start: 2016-01-13 — End: 2016-01-13

## 2016-01-13 MED ORDER — ONDANSETRON 4 MG TAB, RAPID DISSOLVE
4 mg | ORAL_TABLET | Freq: Three times a day (TID) | ORAL | 1 refills | Status: AC | PRN
Start: 2016-01-13 — End: ?

## 2016-01-13 MED ORDER — HYDROCODONE-ACETAMINOPHEN 5 MG-325 MG TAB
5-325 mg | ORAL_TABLET | ORAL | 0 refills | Status: DC | PRN
Start: 2016-01-13 — End: 2016-03-14

## 2016-01-13 MED ORDER — MIDAZOLAM 1 MG/ML IJ SOLN
1 mg/mL | INTRAMUSCULAR | Status: DC | PRN
Start: 2016-01-13 — End: 2016-01-13
  Administered 2016-01-13: 11:00:00 via INTRAVENOUS

## 2016-01-13 MED ORDER — LIDOCAINE (PF) 10 MG/ML (1 %) IJ SOLN
10 mg/mL (1 %) | INTRAMUSCULAR | Status: DC | PRN
Start: 2016-01-13 — End: 2016-01-13

## 2016-01-13 MED ORDER — ONDANSETRON (PF) 4 MG/2 ML INJECTION
4 mg/2 mL | INTRAMUSCULAR | Status: DC | PRN
Start: 2016-01-13 — End: 2016-01-13
  Administered 2016-01-13: 12:00:00 via INTRAVENOUS

## 2016-01-13 MED ORDER — FENTANYL CITRATE (PF) 50 MCG/ML IJ SOLN
50 mcg/mL | INTRAMUSCULAR | Status: DC | PRN
Start: 2016-01-13 — End: 2016-01-13
  Administered 2016-01-13 (×3): via INTRAVENOUS

## 2016-01-13 MED ORDER — PROPOFOL 10 MG/ML IV EMUL
10 mg/mL | INTRAVENOUS | Status: DC | PRN
Start: 2016-01-13 — End: 2016-01-13
  Administered 2016-01-13: 12:00:00 via INTRAVENOUS

## 2016-01-13 MED ORDER — DEXAMETHASONE SODIUM PHOSPHATE 4 MG/ML IJ SOLN
4 mg/mL | INTRAMUSCULAR | Status: AC
Start: 2016-01-13 — End: ?

## 2016-01-13 MED ORDER — LACTATED RINGERS IV
INTRAVENOUS | Status: DC
Start: 2016-01-13 — End: 2016-01-13
  Administered 2016-01-13: 13:00:00 via INTRAVENOUS

## 2016-01-13 MED ORDER — GLYCOPYRROLATE 0.2 MG/ML IJ SOLN
0.2 mg/mL | INTRAMUSCULAR | Status: AC
Start: 2016-01-13 — End: ?

## 2016-01-13 MED ORDER — PROPOFOL 10 MG/ML IV EMUL
10 mg/mL | INTRAVENOUS | Status: AC
Start: 2016-01-13 — End: ?

## 2016-01-13 MED ORDER — ROCURONIUM 10 MG/ML IV
10 mg/mL | INTRAVENOUS | Status: AC
Start: 2016-01-13 — End: ?

## 2016-01-13 MED ORDER — NEOSTIGMINE METHYLSULFATE 1 MG/ML INJECTION
1 mg/mL | INTRAMUSCULAR | Status: DC | PRN
Start: 2016-01-13 — End: 2016-01-13
  Administered 2016-01-13: 12:00:00 via INTRAVENOUS

## 2016-01-13 MED ORDER — LACTATED RINGERS IV
INTRAVENOUS | Status: DC
Start: 2016-01-13 — End: 2016-01-13
  Administered 2016-01-13: 11:00:00 via INTRAVENOUS

## 2016-01-13 MED ORDER — BUPIVACAINE-EPINEPHRINE (PF) 0.5 %-1:200,000 IJ SOLN
0.5 %-1:200,000 | Freq: Once | INTRAMUSCULAR | Status: AC
Start: 2016-01-13 — End: 2016-01-13
  Administered 2016-01-13: 12:00:00 via SUBCUTANEOUS

## 2016-01-13 MED ORDER — SODIUM CHLORIDE 0.9 % IJ SYRG
Freq: Three times a day (TID) | INTRAMUSCULAR | Status: DC
Start: 2016-01-13 — End: 2016-01-13
  Administered 2016-01-13: 12:00:00 via INTRAVENOUS

## 2016-01-13 MED FILL — DEXAMETHASONE SODIUM PHOSPHATE 4 MG/ML IJ SOLN: 4 mg/mL | INTRAMUSCULAR | Qty: 1

## 2016-01-13 MED FILL — SENSORCAINE-MPF/EPINEPHRINE 0.5 %-1:200,000 INJECTION SOLUTION: 0.5 %-1:200,000 | INTRAMUSCULAR | Qty: 30

## 2016-01-13 MED FILL — ROCURONIUM 10 MG/ML IV: 10 mg/mL | INTRAVENOUS | Qty: 5

## 2016-01-13 MED FILL — GLYCOPYRROLATE 0.2 MG/ML IJ SOLN: 0.2 mg/mL | INTRAMUSCULAR | Qty: 2

## 2016-01-13 MED FILL — NEOSTIGMINE METHYLSULFATE 5 MG/5 ML (1 MG/ML) IV SYRINGE: 5 mg/ mL (1 mg/mL) | INTRAVENOUS | Qty: 5

## 2016-01-13 MED FILL — MORPHINE 4 MG/ML SYRINGE: 4 mg/mL | INTRAMUSCULAR | Qty: 1

## 2016-01-13 MED FILL — ONDANSETRON (PF) 4 MG/2 ML INJECTION: 4 mg/2 mL | INTRAMUSCULAR | Qty: 2

## 2016-01-13 MED FILL — MEPERIDINE (PF) 25MG/ML INJECTION: 25 mg/mL | INTRAMUSCULAR | Qty: 1

## 2016-01-13 MED FILL — LACTATED RINGERS IV: INTRAVENOUS | Qty: 1000

## 2016-01-13 MED FILL — CEFAZOLIN 2 GRAM/50 ML NS IVPB: INTRAVENOUS | Qty: 50

## 2016-01-13 MED FILL — SODIUM CHLORIDE 0.9 % IV: INTRAVENOUS | Qty: 1000

## 2016-01-13 MED FILL — KETOROLAC TROMETHAMINE 30 MG/ML INJECTION: 30 mg/mL (1 mL) | INTRAMUSCULAR | Qty: 1

## 2016-01-13 MED FILL — FENTANYL CITRATE (PF) 50 MCG/ML IJ SOLN: 50 mcg/mL | INTRAMUSCULAR | Qty: 4

## 2016-01-13 MED FILL — DIPRIVAN 10 MG/ML INTRAVENOUS EMULSION: 10 mg/mL | INTRAVENOUS | Qty: 20

## 2016-01-13 MED FILL — FENTANYL CITRATE (PF) 50 MCG/ML IJ SOLN: 50 mcg/mL | INTRAMUSCULAR | Qty: 2

## 2016-01-13 MED FILL — MIDAZOLAM 1 MG/ML IJ SOLN: 1 mg/mL | INTRAMUSCULAR | Qty: 2

## 2016-01-13 MED FILL — OXYCODONE-ACETAMINOPHEN 5 MG-325 MG TAB: 5-325 mg | ORAL | Qty: 1

## 2016-01-13 MED FILL — TRANSDERM-SCOP 1 MG OVER 3 DAYS TRANSDERMAL PATCH: 1 mg over 3 days | TRANSDERMAL | Qty: 1

## 2016-01-13 MED FILL — XYLOCAINE-MPF 20 MG/ML (2 %) INJECTION SOLUTION: 20 mg/mL (2 %) | INTRAMUSCULAR | Qty: 5

## 2016-01-13 NOTE — Op Note (Signed)
[facility]   [address]   [place], [place] [id number]   OP NOTE       Name:  Natalie CaddyFARLEY, [name]   MR#:  [id number]   DOB:  December 07, 1965   Account #:  [id number]    Surgery Date:  01/13/2016   Date of Adm:  01/13/2016       PREOPERATIVE DIAGNOSIS: Umbilical hernia.    POSTOPERATIVE DIAGNOSIS: Umbilical hernia.    PROCEDURE PERFORMED: Repair of umbilical hernia.    SURGEON: Carolynn SayersGeorge A Yui Mulvaney, MD    ANESTHESIA: General supplemented with 0.5% Marcaine with   epinephrine.    FINDINGS: A 1 cm defect.    SPECIMENS REMOVED: Part of the hernia sac.    ESTIMATED BLOOD LOSS: Minimal.    COMPLICATIONS: None.    DRAINS: None.    CONDITION: Good to the PACU.    DESCRIPTION OF PROCEDURE: With the patient supine and   suitably anesthetized, the abdomen was prepared with ChloraPrep and   draped as a field. Marcaine 0.5% with epinephrine was infiltrated all   around the umbilicus. A small stab wound was made laterally and   subcutaneous dissection was done with Metzenbaums. The skin was   then cut with Metzenbaum as well to make a nice curvilinear incision.   The umbilical stalk was separated from the hernia sac itself. Part of the   sac was amputated for exposure. The fatty tissue that was protruding   was traced down to the neck of the defect, which was cleared   circumferentially using the cautery. The fat was reduced, and the   defect was closed with 4 sutures of 0 Ethibond. Subcutaneous tissues   were reapproximated with a pursestring suture of 2-0 Vicryl and the   base of the stalk was then secured to that same suture recreating the   concavity of the umbilicus. Skin was closed with subcuticular Monocryl   followed by Dermabond. At the termination of the procedure, all counts   were correct. The patient tolerated this well and was brought to the   PACU in satisfactory condition.        Chiniqua Kilcrease A. Jimmey RalphPARKER, MD      Eugenia PancoastGAP / Cicero DuckLM   D:  01/13/2016   08:20   T:  01/13/2016   08:48   Job #:  [id number]

## 2016-01-13 NOTE — Op Note (Signed)
[facility]   [address] [place] [address]   OP NOTE       Name:  [name], [name]   MR#:  [id number]   DOB:  10/25/1965   Account #:  [id number]    Surgery Date:  01/13/2016   Date of Adm:  01/13/2016       PREOPERATIVE DIAGNOSIS: Umbilical hernia.    POSTOPERATIVE DIAGNOSIS: Umbilical hernia.    PROCEDURE PERFORMED: Repair of umbilical hernia.    SURGEON: Deshanae Lindo A Navneet Schmuck, MD    ANESTHESIA: General supplemented with 0.5% Marcaine with   epinephrine.    FINDINGS: A 1 cm defect.    SPECIMENS REMOVED: Part of the hernia sac.    ESTIMATED BLOOD LOSS: Minimal.    COMPLICATIONS: None.    DRAINS: None.    CONDITION: Good to the PACU.    DESCRIPTION OF PROCEDURE: With the patient supine and   suitably anesthetized, the abdomen was prepared with ChloraPrep and   draped as a field. Marcaine 0.5% with epinephrine was infiltrated all   around the umbilicus. A small stab wound was made laterally and   subcutaneous dissection was done with Metzenbaums. The skin was   then cut with Metzenbaum as well to make a nice curvilinear incision.   The umbilical stalk was separated from the hernia sac itself. Part of the   sac was amputated for exposure. The fatty tissue that was protruding   was traced down to the neck of the defect, which was cleared   circumferentially using the cautery. The fat was reduced, and the   defect was closed with 4 sutures of 0 Ethibond. Subcutaneous tissues   were reapproximated with a pursestring suture of 2-0 Vicryl and the   base of the stalk was then secured to that same suture recreating the   concavity of the umbilicus. Skin was closed with subcuticular Monocryl   followed by Dermabond. At the termination of the procedure, all counts   were correct. The patient tolerated this well and was brought to the   PACU in satisfactory condition.        Janna Oak A. Evamarie Raetz, MD      GAP / TLM   D:  01/13/2016   08:20   T:  01/13/2016   08:48   Job #:  [id number]

## 2016-01-13 NOTE — Anesthesia Post-Procedure Evaluation (Signed)
Post-Anesthesia Evaluation and Assessment    Patient: Natalie CaddyDanita Guilbault MRN: [id number]  SSN: [id number]    Date of Birth: [birth year]  Age: 50 y.o.  Sex: female       Cardiovascular Function/Vital Signs  Visit Vitals   ??? BP 101/69   ??? Pulse 75   ??? Temp 36.5 ??C (97.7 ??F)   ??? Resp 11   ??? Ht 5\' 7"  (1.702 m)   ??? Wt 67.7 kg (149 lb 4 oz)   ??? SpO2 98%   ??? BMI 23.38 kg/m2       Patient is status post general anesthesia for Procedure(s):  UMBILICAL HERNIA REPAIR ( REQUESTED ANES - DR ESTES ).    Nausea/Vomiting: None    Postoperative hydration reviewed and adequate.    Pain:  Pain Scale 1: Numeric (0 - 10) (01/13/16 0910)  Pain Intensity 1: 3 (01/13/16 0910)   Managed    Neurological Status:   Neuro (WDL): Within Defined Limits (01/13/16 0910)  Neuro  LUE Motor Response: Purposeful (01/13/16 0910)  LLE Motor Response: Purposeful (01/13/16 0910)  RUE Motor Response: Purposeful (01/13/16 0910)  RLE Motor Response: Purposeful (01/13/16 0910)   At baseline    Mental Status and Level of Consciousness: Arousable    Pulmonary Status:   O2 Device: Room air (01/13/16 0835)   Adequate oxygenation and airway patent    Complications related to anesthesia: None    Post-anesthesia assessment completed. No concerns    Signed By: Inetta FermoPeter Kierston Plasencia, MD     January 13, 2016

## 2016-01-13 NOTE — Brief Op Note (Signed)
BRIEF OPERATIVE NOTE    Date of Procedure: 01/13/2016   Preoperative Diagnosis: UMBILICAL HERNIA  Postoperative Diagnosis: UMBILICAL HERNIA    Procedure(s):  UMBILICAL HERNIA REPAIR ( REQUESTED ANES - DR ESTES )  Surgeon(s) and Role:     * Natalie SayersGeorge A Byrd Terrero, MD - Primary         Assistant Staff:       Surgical Staff:  Circ-1: Rosamaria Lintseborah S Bartol, RN  Scrub RN-1: Gennaro AfricaAnne G Bliss, RN  Surg Asst-1: Geanie LoganGulzighra Abdushukur  Event Time In   Incision Start [phone]   Incision Close      Anesthesia: General   Estimated Blood Loss: minimal  Specimens:   ID Type Source Tests Collected by Time Destination   1 : Hernia Sac Fresh Tissue  Natalie SayersGeorge A Percival Glasheen, MD 01/13/2016 [phone] Pathology      Findings: 1 cm defect   Complications: none  Implants: * No implants in log *    [phone]

## 2016-01-13 NOTE — Anesthesia Pre-Procedure Evaluation (Signed)
Anesthetic History   No history of anesthetic complications            Review of Systems / Medical History  Patient summary reviewed, nursing notes reviewed and pertinent labs reviewed    Pulmonary  Within defined limits                 Neuro/Psych   Within defined limits           Cardiovascular  Within defined limits                     GI/Hepatic/Renal  Within defined limits   GERD           Endo/Other  Within defined limits           Other Findings              Physical Exam    Airway  Mallampati: II  TM Distance: > 6 cm  Neck ROM: normal range of motion   Mouth opening: Normal     Cardiovascular  Regular rate and rhythm,  S1 and S2 normal,  no murmur, click, rub, or gallop             Dental  No notable dental hx       Pulmonary  Breath sounds clear to auscultation               Abdominal  GI exam deferred       Other Findings            Anesthetic Plan    ASA: 2  Anesthesia type: general          Induction: Intravenous  Anesthetic plan and risks discussed with: Patient

## 2016-01-13 NOTE — Interval H&P Note (Signed)
H&P Update:  [name] was seen and examined.  History and physical has been reviewed. The patient has been examined. There have been no significant clinical changes since the completion of the originally dated History and Physical.    Signed By: Carolynn SayersGeorge A Raelee Rossmann, MD     January 13, 2016 6:36 AM

## 2016-01-13 NOTE — Other (Signed)
Patient unable to void.  Unable to obtain urine HCG.  Dr. Nonah MattesBumanis updated on status of unable to obtain urine HCG.  Dr. Nonah MattesBumanis states, "no urine HCG needed."

## 2016-01-13 NOTE — Other (Signed)
Bair hugger used and monitored by anesthesia during the procedure.

## 2016-01-13 NOTE — Other (Signed)
Patient: [name] MRN: [id number]  SSN: [id number]   Date of Birth: 09/16/1965  Age: 50 y.o.  Sex: female     Patient is status post Procedure(s):  UMBILICAL HERNIA REPAIR ( REQUESTED ANES - DR ESTES ).    Surgeon(s) and Role:     * Carolynn SayersGeorge A Parker, MD - Primary    Local/Dose/Irrigation:  [facility]                  Peripheral IV 01/13/16 Left Arm (Active)   Site Assessment Clean, dry, & intact 01/13/2016  7:21 AM   Phlebitis Assessment 0 01/13/2016  7:21 AM   Infiltration Assessment 0 01/13/2016  7:21 AM   Dressing Status Clean, dry, & intact;New 01/13/2016  7:21 AM   Dressing Type Tape;Transparent 01/13/2016  7:21 AM   Hub Color/Line Status Pink 01/13/2016  7:21 AM            Airway - Endotracheal Tube Oral (Active)                   Dressing/Packing:  Wound Abdomen-DRESSING TYPE: Topical skin adhesive/glue (Dermabond on the incision site) (01/13/16 0700)  Splint/Cast:  ]    Other:

## 2016-01-14 MED FILL — NEOSTIGMINE METHYLSULFATE 5 MG/5 ML (1 MG/ML) IV SYRINGE: 5 mg/ mL (1 mg/mL) | INTRAVENOUS | Qty: 3

## 2016-01-14 MED FILL — XYLOCAINE-MPF 20 MG/ML (2 %) INJECTION SOLUTION: 20 mg/mL (2 %) | INTRAMUSCULAR | Qty: 4

## 2016-01-14 MED FILL — DIPRIVAN 10 MG/ML INTRAVENOUS EMULSION: 10 mg/mL | INTRAVENOUS | Qty: 15

## 2016-01-14 MED FILL — TRANSDERM-SCOP 1 MG OVER 3 DAYS TRANSDERMAL PATCH: 1 mg over 3 days | TRANSDERMAL | Qty: 1

## 2016-01-14 MED FILL — DEXAMETHASONE SODIUM PHOSPHATE 4 MG/ML IJ SOLN: 4 mg/mL | INTRAMUSCULAR | Qty: 1

## 2016-01-14 MED FILL — GLYCOPYRROLATE 0.2 MG/ML IJ SOLN: 0.2 mg/mL | INTRAMUSCULAR | Qty: 2

## 2016-01-14 MED FILL — KETOROLAC TROMETHAMINE 30 MG/ML INJECTION: 30 mg/mL (1 mL) | INTRAMUSCULAR | Qty: 1

## 2016-01-14 MED FILL — ROCURONIUM 10 MG/ML IV: 10 mg/mL | INTRAVENOUS | Qty: 3

## 2016-01-22 ENCOUNTER — Ambulatory Visit
Admit: 2016-01-22 | Discharge: 2016-01-22 | Payer: PRIVATE HEALTH INSURANCE | Attending: Family | Primary: Internal Medicine

## 2016-01-22 ENCOUNTER — Encounter: Attending: Surgical Oncology | Primary: Internal Medicine

## 2016-01-22 DIAGNOSIS — Z09 Encounter for follow-up examination after completed treatment for conditions other than malignant neoplasm: Secondary | ICD-10-CM

## 2016-01-22 NOTE — Progress Notes (Signed)
1. Have you been to the ER, urgent care clinic since your last visit?  Hospitalized since your last visit? No     2. Have you seen or consulted any other health care providers outside of the [facility] since your last visit?  Include any pap smears or colon screening. No

## 2016-01-22 NOTE — Patient Instructions (Addendum)
As of today, you may lift up to 15 lbs for another week and then increase to 25 lbs the following week. On the third week from now, you may lift 35-40 lbs as tolerated. Thereafter, gradually increase weight limit as tolerated.    If at any time any lifting or activity causes you pain, please avoid it until pain improves.     If you had an inguinal hernia repair, do not perform any biking for 6 weeks after your surgery.           Abdominal Hernia Repair: What to Expect at Home  Your Recovery  After surgery to repair your hernia, you are likely to have pain for a few days. You may also feel like you have the flu, and you may have a low fever and feel tired and nauseated. This is common.  You should feel better after a few days and will probably feel much better in 7 days.  For several weeks you may feel twinges or pulling in the hernia repair when you move. You may have some bruising around the area of your hernia repair. This is normal.  This care sheet gives you a general idea about how long it will take for you to recover. But each person recovers at a different pace. Follow the steps below to get better as quickly as possible.  How can you care for yourself at home?  Activity  ?? Rest when you feel tired. Getting enough sleep will help you recover.  ?? Try to walk each day. Start by walking a little more than you did the day before. Bit by bit, increase the amount you walk. Walking boosts blood flow and helps prevent pneumonia and constipation.  ?? If your doctor gives you an abdominal binder to wear, use it as directed. This is an elastic bandage that wraps around your belly and upper hips. It helps support your belly muscles after surgery.  ?? Avoid strenuous activities, such as biking, jogging, weight lifting, or aerobic exercise, until your doctor says it is okay.  ?? Avoid lifting anything that would make you strain. This may include heavy grocery bags and milk containers, a heavy briefcase or backpack, cat  litter or dog food bags, a vacuum cleaner, or a child.  ?? Ask your doctor when you can drive again.  ?? Most people are able to return to work within 1 to 2 weeks after surgery. But if your job requires that you to do heavy lifting or strenuous activity, you may need to take 4 to 6 weeks off from work.  ?? You may shower 24 to 48 hours after surgery, if your doctor okays it. Pat the cut (incision) dry. Do not take a bath for the first 2 weeks, or until your doctor tells you it is okay.  ?? Ask your doctor when it is okay for you to have sex.  Diet  ?? You can eat your normal diet. If your stomach is upset, try bland, low-fat foods like plain rice, broiled chicken, toast, and yogurt.  ?? Drink plenty of fluids (unless your doctor tells you not to).  ?? You may notice that your bowel movements are not regular right after your surgery. This is common. Avoid constipation and straining with bowel movements. You may want to take a fiber supplement every day. If you have not had a bowel movement after a couple of days, ask your doctor about taking a mild laxative.  Medicines  ?? Your  doctor will tell you if and when you can restart your medicines. He or she will also give you instructions about taking any new medicines.  ?? If you take blood thinners, such as warfarin (Coumadin), clopidogrel (Plavix), or aspirin, be sure to talk to your doctor. He or she will tell you if and when to start taking those medicines again. Make sure that you understand exactly what your doctor wants you to do.  ?? Be safe with medicines. Take pain medicines exactly as directed.  ?? If the doctor gave you a prescription medicine for pain, take it as prescribed.  ?? If you are not taking a prescription pain medicine, ask your doctor if you can take an over-the-counter medicine.  ?? If your doctor prescribed antibiotics, take them as directed. Do not stop taking them just because you feel better. You need to take the full course of antibiotics.   ?? If you think your pain medicine is making you sick to your stomach:  ?? Take your medicine after meals (unless your doctor has told you not to).  ?? Ask your doctor for a different pain medicine.  Incision care  ?? If you have strips of tape on the cut (incision) the doctor made, leave the tape on for a week or until it falls off. Or follow your doctor's instructions for removing the tape.  ?? If you have staples closing the cut, you will need to visit your doctor in 1 to 2 weeks to have them removed.  ?? Wash the area daily with warm, soapy water, and pat it dry. Don't use hydrogen peroxide or alcohol, which can slow healing. You may cover the area with a gauze bandage if it weeps or rubs against clothing. Change the bandage every day.  Other instructions  ?? Hold a pillow over your incision when you cough or take deep breaths. This will support your belly and decrease your pain.  ?? Do breathing exercises at home as instructed by your doctor. This will help prevent pneumonia.  ?? If you had laparoscopic surgery, you may also have pain in your left shoulder. The pain usually lasts about a day or two.  Follow-up care is a key part of your treatment and safety. Be sure to make and go to all appointments, and call your doctor if you are having problems. It's also a good idea to know your test results and keep a list of the medicines you take.  When should you call for help?  Call 911 anytime you think you may need emergency care. For example, call if:  ?? You passed out (lost consciousness).  ?? You have sudden chest pain and shortness of breath, or you cough up blood.  ?? You have severe pain in your belly.  Call your doctor now or seek immediate medical care if:  ?? You are sick to your stomach and cannot keep fluids down.  ?? You have signs of a blood clot, such as:  ?? Pain in your calf, back of the knee, thigh, or groin.  ?? Redness and swelling in your leg or groin.  ?? You have signs of infection, such as:   ?? Increased pain, swelling, warmth, or redness.  ?? Red streaks leading from the incision.  ?? Pus draining from the incision.  ?? A fever.  ?? You have trouble passing urine or stool, especially if you have mild pain or swelling in your lower belly.  ?? Bright red blood has soaked through  the bandage over your incision.  Watch closely for changes in your health, and be sure to contact your doctor if:  ?? Your swelling is getting worse.  ?? Your swelling is not going down.  ?? You still don't have a bowel movement after taking a laxative.  Where can you learn more?  Go to [employer].  Enter B577 in the search box to learn more about "Abdominal Hernia Repair: What to Expect at Home."  Current as of: January 01, 2015  Content Version: 11.3  ?? 2006-2017 [employer]. Care instructions adapted under license by [employer] (which disclaims liability or warranty for this information). If you have questions about a medical condition or this instruction, always ask your healthcare professional. [employer] disclaims any warranty or liability for your use of this information.

## 2016-01-22 NOTE — Progress Notes (Signed)
Subjective:    [name] is a 50 y.o. female presents for postop care 9 days following umbilical hernia repair by Dr. Clinton Sawyer. Appetite is good. Eating a regular diet  without difficulty. Bowel movements are  regular. The patient is not having any pain from surgery. Had a back injurty 2 days before work and is the the process of getting it treated, using workman's comp.. Denies fever, nausea, shortness of breath, chest pain, redness at incision site, vomiting and diarrhea    Advance directive not on file      Objective:     Visit Vitals   ??? BP 122/68 (BP 1 Location: Left arm, BP Patient Position: Sitting)   ??? Pulse 100   ??? Temp 98.3 ??F (36.8 ??C) (Oral)   ??? Resp 18   ??? Ht 5\' 7"  (1.702 m)   ??? Wt 152 lb (68.9 kg)   ??? LMP 01/02/2016   ??? SpO2 98%   ??? BMI 23.81 kg/m2       General:  alert, no distress   Abdomen: soft, bowel sounds active, non-tender   Incision:   healing well, no drainage, no erythema, no seroma, no swelling, no dehiscence, incisions well approximated   Heart: regular rate and rhythm, S1, S2 normal, no murmur, click, rub or gallop   Lungs: clear to auscultation bilaterally     Pathology:    Soft tissue, abdomen, excision:   Fibromuscular tissue with benign urachal remnant     Assessment:     umbilical hernia status post repair. Doing well postoperatively.    Plan:     1. Pt is to increase activities as tolerated..  May lift 15 lbs starting today x 1 week, then increase to 25 lbs x 1 week and increase slowly thereafter.  RTW note - pt agreed to no heavy lifting at work. Will have follow employees help if needed. She may not go back soon due to back injury  2. Follow-up prn.  3. Wound care discussed  4. Back pain : follow physician's orders for activity restrictions    [name] has a reminder for a "due or due soon" health maintenance. I have asked that she contact her primary care provider for follow-up on this health maintenance.    Patient verbalized understanding and agreement.

## 2016-01-29 ENCOUNTER — Encounter: Attending: Surgical Oncology | Primary: Internal Medicine

## 2016-01-30 ENCOUNTER — Encounter

## 2016-01-30 ENCOUNTER — Ambulatory Visit: Payer: PRIVATE HEALTH INSURANCE | Primary: Internal Medicine

## 2016-01-31 ENCOUNTER — Ambulatory Visit

## 2016-01-31 ENCOUNTER — Inpatient Hospital Stay: Admit: 2016-01-31 | Payer: PRIVATE HEALTH INSURANCE | Attending: Internal Medicine | Primary: Internal Medicine

## 2016-01-31 DIAGNOSIS — M47896 Other spondylosis, lumbar region: Secondary | ICD-10-CM

## 2016-01-31 MED ORDER — GADOTERIDOL 279.3 MG/ML INTRAVENOUS SOLUTION
279.3 mg/mL | Freq: Once | INTRAVENOUS | Status: AC
Start: 2016-01-31 — End: 2016-01-31
  Administered 2016-01-31: 18:00:00 via INTRAVENOUS

## 2016-03-14 ENCOUNTER — Inpatient Hospital Stay
Admit: 2016-03-14 | Discharge: 2016-03-15 | Disposition: A | Discharge status: Home / Self Care | Payer: PRIVATE HEALTH INSURANCE | Attending: Emergency Medicine

## 2016-03-14 DIAGNOSIS — M545 Low back pain: Secondary | ICD-10-CM

## 2016-03-14 NOTE — ED Triage Notes (Addendum)
Lower back pain radiating into both legs with numbness. "My legs keep buckling from under me. 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 I've continued to work but it keeps getting worse. Tonight, I'm suppose to work but I didn't even clock in because I'm afraid I'll drop a child because my legs keep giving out". Pt arrives ambulatory to triage in tears. Unable to sit for vitals.

## 2016-03-14 NOTE — ED Notes (Signed)
Pt repositioned for comfort. Pillow placed under knees for comfort. Call bell within reach. Pt provided with emesis bag and [place], [place] notified.     Verbal order for Zofran received.

## 2016-03-14 NOTE — ED Notes (Signed)
April, PA at the bedside.

## 2016-03-14 NOTE — ED Notes (Signed)
Pt reports that she is experiencing 5/10 pain with no movement. [place], [place] notified.

## 2016-03-14 NOTE — ED Provider Notes (Addendum)
HPI Comments: 50 yo Caucasian female with medical hx remarkable for kidney stones, basal cell carcinoma, GERD, hypertension, migraines, and umbilical hernia presenting ambulatory to the ED with complaint of 7/10, left sided lower back pain worse with ambulating, sitting and standing since August 2017 following an injury. She has been seen by Dr. Lyn HollingsheadAlexander, neurosurgery and referred for PT which she has been completing. She is taking Skelaxin and flexeril for muscle relaxation. Also has been taking mobic daily prescribed by PCP and Tylenol 1 gram this evening. Legs have "buckled" several times in past two months. Yesterday and today two episodes of "buckling". She is worried about falling at work secondary to pain. No associated fever, saddle paresthesia, abdominal pain, urinary incontinence, bowel incontinence or saddle paresthesia.     Patient is a 50 y.o. female presenting with back pain. The history is provided by the patient.   Back Pain    Pertinent negatives include no chest pain, no fever, no numbness, no headaches and no abdominal pain.        Past Medical History:   Diagnosis Date   ??? Calculus of kidney    ??? Cancer (HCC)     BASAL CELL CARCINOMAS REMOVED   ??? Endometriosis    ??? GERD (gastroesophageal reflux disease)    ??? Hypertension    ??? Migraine    ??? Other ill-defined conditions(799.89)     UNCONTROLLED VAG BLEEDING 3-6/08-RECEIVED LUPRON INJECTIONS FOR   ??? Umbilical hernia 12/25/2015       Past Surgical History:   Procedure Laterality Date   ??? HX APPENDECTOMY  1992   ??? HX GYN  2002    PELVIC LAP WITH REMOVAL ENDOMETRIOSIS   ??? HX HEENT  2005    LASIK SURGERY   ??? HX ORTHOPAEDIC  1983    L KNEE TENDON/LIGAMENT RELEASE   ??? HX ORTHOPAEDIC  1992    L KNEE ARTHROSCOPY & MENISCUS REPAIR   ??? HX ORTHOPAEDIC  1998    L5-S1 LAM/DISKECTOMY   ??? HX ORTHOPAEDIC  2000    L4,5 LAM/DISKECTOMY   ??? HX ORTHOPAEDIC  2014    RIGHT CARPAL TUNNEL,REMOVAL HARDWARE RIGHT TIBIA   ??? HX OTHER SURGICAL  2001    BCCA EXCISION-2 SITES    ??? HX OTHER SURGICAL  2017    COLPOSCOPY PROCEDURE/BIOPSIES   ??? HX TONSIL AND ADENOIDECTOMY  1968   ??? HX TONSILLECTOMY  1968    ADENOIDECTOMY   ??? HX UROLOGICAL  2003    CYSTOSCOPY,REMOVAL RENAL CALCULI         Family History:   Problem Relation Age of Onset   ??? Hypertension Father    ??? Heart Disease Father    ??? Cancer Father      SKIN   ??? Stroke Father    ??? Elevated Lipids Father    ??? Post-op Nausea/Vomiting Mother    ??? Cancer Mother    ??? Elevated Lipids Sister    ??? Asthma Sister        Social History     Social History   ??? Marital status: DIVORCED     Spouse name: N/A   ??? Number of children: N/A   ??? Years of education: N/A     Occupational History   ??? Not on file.     Social History Main Topics   ??? Smoking status: Never Smoker   ??? Smokeless tobacco: Never Used   ??? Alcohol use No   ??? Drug use: No   ???  Sexual activity: Yes     Birth control/ protection: Pill     Other Topics Concern   ??? Not on file     Social History Narrative         ALLERGIES: Bee sting [sting, bee]    Review of Systems   Constitutional: Negative.  Negative for chills, fatigue and fever.   HENT: Negative.  Negative for congestion, ear pain, facial swelling, rhinorrhea, sneezing and sore throat.    Eyes: Negative for pain, discharge and itching.   Respiratory: Negative for cough, chest tightness and shortness of breath.    Cardiovascular: Negative.  Negative for chest pain and leg swelling.   Gastrointestinal: Negative.  Negative for abdominal distention, abdominal pain, constipation, diarrhea, nausea and vomiting.        No bowel incontinence   Genitourinary: Negative for difficulty urinating, frequency and urgency.        No urinary incontinence   Musculoskeletal: Positive for back pain. Negative for arthralgias, joint swelling, neck pain and neck stiffness.   Skin: Negative for color change and rash.   Neurological: Negative for dizziness, numbness and headaches.        No saddle paresthesias    Psychiatric/Behavioral: Negative for behavioral problems, confusion and decreased concentration.   All other systems reviewed and are negative.      Vitals:    03/14/16 1928 03/14/16 2100 03/14/16 2130 03/14/16 2200   BP: (!) 161/115 112/83 118/78 122/78   Pulse: (!) 122 89  85   Resp: 16   18   Temp: 98.2 ??F (36.8 ??C)   97.9 ??F (36.6 ??C)   SpO2: 100% 99% 100% 100%   Weight: 74.4 kg (164 lb)      Height: 5\' 7"  (1.702 m)               Physical Exam   Constitutional: She is oriented to person, place, and time. She appears well-developed and well-nourished. No distress.   Caucasian female pacing around room appears to be in pain     HENT:   Head: Normocephalic and atraumatic.   Right Ear: External ear normal.   Left Ear: External ear normal.   Nose: Nose normal.   Mouth/Throat: Oropharynx is clear and moist. No oropharyngeal exudate.   Eyes: Conjunctivae and EOM are normal. Pupils are equal, round, and reactive to light. Right eye exhibits no discharge. Left eye exhibits no discharge. No scleral icterus.   Neck: Normal range of motion. Neck supple.   Cardiovascular: Normal rate, regular rhythm and normal heart sounds.  Exam reveals no gallop and no friction rub.    No murmur heard.  Pulmonary/Chest: Effort normal and breath sounds normal. She has no wheezes. She has no rales.   Abdominal: Soft. Bowel sounds are normal. She exhibits no distension. There is no tenderness. There is no rebound and no guarding.   Musculoskeletal: Normal range of motion.   Lumbar spine: surgical scar midline,  Left sided paraspinal muscle tenderness, no mass, warmth, or erythema appreciated.  No bony tenderness, 5/5 LE strength, distal sensation intact, cap refill appropriate   Neurological: She is alert and oriented to person, place, and time. No cranial nerve deficit. Coordination normal.   Skin: Skin is warm and dry. She is not diaphoretic.   Psychiatric: She has a normal mood and affect. Her behavior is normal.    Nursing note and vitals reviewed.       MDM  Number of Diagnoses or Management Options  Acute  left-sided low back pain without sciatica:   Diagnosis management comments: 50 yo Caucasian female with complaint of continued lower back pain with episodes of "buckling" in the LE. Pt appears to be in pain but no e/o of cord compression. MRI last month reviewed with mild foraminal stenosis. No new trauma.     Plan  Analgesia  Reassess. Asiya Cutbirth C [place], [place]      ED Course       Procedures   Progress note    Pain improved. Pt will follow-up with Dr. Lyn Hollingshead. Will start on medrol dose pack, and valium for muscle relaxation. Obrian Bulson C [place], [place]    Patient's results have been reviewed with them.  Patient and/or family have verbally conveyed their understanding and agreement of the patient's signs, symptoms, diagnosis, treatment and prognosis and additionally agree to follow up as recommended or return to the Emergency Room should their condition change prior to follow-up.  Discharge instructions have also been provided to the patient with some educational information regarding their diagnosis as well a list of reasons why they would want to return to the ER prior to their follow-up appointment should their condition change. Serenity Batley C [place], [place]    A/P  Acute low back pain: Apply moist heat  Valium 1 tab every 8 hrs as needed for muscle relaxation  Medrol dose pack  Follow-up with Dr. Lyn Hollingshead  Return for any new or worsening. Malita Ignasiak C [place], [place]

## 2016-03-14 NOTE — ED Notes (Signed)
Discharge instructions given to pt.  All questions answered and pt verbalized understanding.   V/S stable @ time of discharge. No lines or drains in place. Pt ambulatory out of unit.

## 2016-03-14 NOTE — ED Notes (Signed)
Pt tearful and unable to get in a comfortable position. Pt requesting pain medication. April, [place] notified.

## 2016-03-14 NOTE — ED Notes (Signed)
Pt requesting to speak with April, PA prior to having IV out. April, [place] notified.

## 2016-03-15 MED ORDER — DIAZEPAM 5 MG TAB
5 mg | ORAL | Status: AC
Start: 2016-03-15 — End: 2016-03-14
  Administered 2016-03-15: 01:00:00 via ORAL

## 2016-03-15 MED ORDER — OXYCODONE-ACETAMINOPHEN 5 MG-325 MG TAB
5-325 mg | ORAL_TABLET | ORAL | 0 refills | Status: AC | PRN
Start: 2016-03-15 — End: ?

## 2016-03-15 MED ORDER — ONDANSETRON (PF) 4 MG/2 ML INJECTION
4 mg/2 mL | INTRAMUSCULAR | Status: AC
Start: 2016-03-15 — End: 2016-03-14
  Administered 2016-03-15: 01:00:00 via INTRAVENOUS

## 2016-03-15 MED ORDER — METHYLPREDNISOLONE 4 MG TABS IN A DOSE PACK
4 mg | ORAL | 0 refills | Status: DC
Start: 2016-03-15 — End: 2016-03-14

## 2016-03-15 MED ORDER — HYDROMORPHONE (PF) 1 MG/ML IJ SOLN
1 mg/mL | INTRAMUSCULAR | Status: AC
Start: 2016-03-15 — End: 2016-03-14
  Administered 2016-03-15: 01:00:00 via INTRAVENOUS

## 2016-03-15 MED ORDER — DIAZEPAM 5 MG TAB
5 mg | ORAL_TABLET | Freq: Three times a day (TID) | ORAL | 0 refills | Status: AC | PRN
Start: 2016-03-15 — End: ?

## 2016-03-15 MED ORDER — METHYLPREDNISOLONE 4 MG TABS IN A DOSE PACK
4 mg | ORAL | 0 refills | Status: AC
Start: 2016-03-15 — End: ?

## 2016-03-15 MED ORDER — KETOROLAC TROMETHAMINE 30 MG/ML INJECTION
30 mg/mL (1 mL) | INTRAMUSCULAR | Status: AC
Start: 2016-03-15 — End: 2016-03-14
  Administered 2016-03-15: 01:00:00 via INTRAVENOUS

## 2016-03-15 MED ORDER — OXYCODONE-ACETAMINOPHEN 5 MG-325 MG TAB
5-325 mg | ORAL | Status: DC
Start: 2016-03-15 — End: 2016-03-14

## 2016-03-15 MED FILL — ONDANSETRON (PF) 4 MG/2 ML INJECTION: 4 mg/2 mL | INTRAMUSCULAR | Qty: 2

## 2016-03-15 MED FILL — DIAZEPAM 5 MG TAB: 5 mg | ORAL | Qty: 1

## 2016-03-15 MED FILL — HYDROMORPHONE (PF) 1 MG/ML IJ SOLN: 1 mg/mL | INTRAMUSCULAR | Qty: 1

## 2016-03-15 MED FILL — KETOROLAC TROMETHAMINE 30 MG/ML INJECTION: 30 mg/mL (1 mL) | INTRAMUSCULAR | Qty: 1

## 2016-03-16 NOTE — Progress Notes (Signed)
Transition Of Care Note    Patient discharged from [facility] ED on 10/21 for acute left sided low back pain.      Medical History:     Past Medical History:   Diagnosis Date   ??? Calculus of kidney    ??? Cancer (HCC)     BASAL CELL CARCINOMAS REMOVED   ??? Endometriosis    ??? GERD (gastroesophageal reflux disease)    ??? Hypertension    ??? Migraine    ??? Other ill-defined conditions(799.89)     UNCONTROLLED VAG BLEEDING 3-6/08-RECEIVED LUPRON INJECTIONS FOR   ??? Umbilical hernia 12/25/2015       Care Manager contacted the patient by telephone to perform post ED discharge assessment.  Verified DOB and zip code with patient as identifiers.  Provided introduction to self, and explanation of the Nurse Care Manager role.      The patient expressed frustration, stating that she fell and injured her back at work in August of this year and states that she continues to have pain. Patient stated that she fell again on 10/20 while working and reported to the ED on 10/28 for treatment.     Medication:   Discussed medication prescribed in the ED with patient, and patient verbalizes understanding of administration of home medications.  There were no barriers to obtaining medications identified at this time.    Support system:  patient    Discharge Instructions :  Reviewed discharge instructions with patient.  Patient verbalizes understanding of discharge instructions and follow-up care.       Red Flags:  Call your doctor now or seek immediate medical care if:  ??You have new or worsening numbness in your legs.  ??You have new or worsening weakness in your legs. (This could make it hard to stand up.)  ??You lose control of your bladder or bowels.  Watch closely for changes in your health, and be sure to contact your doctor if:  ??Your pain gets worse.  ??You are not getting better after 2 weeks.    Advance Care Planning:   Patient was offered the opportunity to discuss advance care planning:  no     Does patient have an Advance Directive:  no    If no, did you provide information on [facility] Connections?  no     PCP/Specialist follow up: Patient scheduled to follow up with Calvert CantorKevin C Harrison, MD TBD. Patient has an appointment with Dr Lodema HongSimpson on 10/25 and a follow up with neurosurgeon Dr Eulah PontMurphy on Friday 10/27.  Reviewed red flags with patient, and patient verbalizes understanding.  Patient given an opportunity to ask questions. No other clinical/social/functional needs noted.   The patient agrees to contact the PCP office for questions related to their healthcare.  The patient expressed thanks, offered no additional questions and ended the call.

## 2016-03-16 NOTE — Progress Notes (Signed)
[facility].  Left message on voicemail.  Will attempt to contact again.  Need to complete post-discharge assessment.

## 2016-03-16 NOTE — Progress Notes (Signed)
Received incoming call from patient, to inform this ECM to clarify that the back pain is on the right side, not the left side as indicated in the discharge paperwork. Patient also indicated that she was referred to patient advocacy. This CM also recommended that she put in an ASK HR request. Plan to contact this patient again later this week to discuss follow up appointment.

## 2016-03-23 NOTE — Progress Notes (Signed)
Telephone outreach to patient to follow up after her scheduled orthopedic appointment. This CM left a discreet VM message, providing contact information and requesting a return call.

## 2016-05-11 ENCOUNTER — Inpatient Hospital Stay
Admit: 2016-05-11 | Payer: PRIVATE HEALTH INSURANCE | Attending: Rehabilitative and Restorative Service Providers" | Primary: Internal Medicine

## 2016-05-11 DIAGNOSIS — M545 Low back pain: Secondary | ICD-10-CM

## 2016-05-11 NOTE — Progress Notes (Signed)
PT INITIAL EVALUATION NOTE 2-15    Patient Name: [name]  Date:05/11/2016  DOB: 12/14/1965    Patient DOB Verified  Payor: Monia PouchAETNA / Plan: [employer] [place] EMPLOYEE PLAN / Product Type: PPO /    In time:1130  Out time:1230  Total Treatment Time (min): 60  Visit #: 1     Treatment Area: Low back pain [M54.5]    SUBJECTIVE  Pain Level (0-10 scale): 6/10  Any medication changes, allergies to medications, adverse drug reactions, diagnosis change, or new procedure performed?:  No     Yes (see summary sheet for update)  Subjective:     Patient has a significant and long history of low back pathology.  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 she knelt down to pick up a diaper and when she stood back up she felt a 'pop' in the low back and pain shot down her right lateral leg.  She has numbness in her entire right LE.  Recent MRI 01/31/16 shows bulging L4-5 disks.  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  She has taken  two 1 month intervals off of work due to falling and pain from her back injury.  Currently she is working full time but not able to work consecutive shifts.  She reports that she has not fallen since receiving cortisone injection Dec. 6 2017.  Currently her pain is in the low back and refers to [name] hips/buttocks and is consistently 4-5/10 pain.  She is taking neurotin, and motrin daily.  She is unable to sit for longer than 20 minutes.  She is on her feet for a whole shift her right leg starts to feel heavy and like it might buckle.    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        Side Bending   0% (hinge T10-12)        Rotation   25%  25%          LOWER QUARTER   MUSCLE STRENGTH  KEY       R  L  0 - No Contraction  L1, L2 Psoas  3/5  5/5  1 - Trace   L3 Quads  4/5  5/5  2 - Poor   L4 Tib Ant  4-/5  5/5  3 -  Fair    L5 EHL  3/5  5/5  4 - Good   S1 FHL  3/5  5/5  5 - Normal   S2 Hams  4-/5  5/5              MMT: TrA 4-/5  Neurological: Reflexes / Sensations: diminish globally on right LE  Special Tests: lumbar   Trendelenberg: neg    Stork: positive right   Forward Bend: --    Slump: positive   Thomas: tight psoas     Ober: neg   H.S. SLR: positive right   Piriformis Ext: neg   Long Sit: neg     Other: --   Compression/Distraction: neg      Modality rationale: decrease pain and increase muscle contraction/control to improve the patient???s ability to stand without pain   Min Type Additional Details     Estim: Att   Unatt        TENS instruct                  IFC  Premod   NMES                     Other:  w/US   w/ice   w/heat  Position:  Location:      Traction:  Cervical       Lumbar                        Prone          Supine                       Intermittent   Continuous Lbs:H:50lbs, L25lbs   before manual   after manual  w/heat      Ultrasound: Continuous    Pulsed at:                            1MHz   3MHz Location:  W/cm2:      Paraffin         Location:  w/heat      Ice       Heat    Ice massage Position:  Location:      Laser     Other: Position:  Location:      Vasopneumatic Device Pressure:        lo  med  hi   Temperature:     Skin assessment post-treatment:  intact redness- no adverse reaction    redness ??? adverse reaction:     15 min Therapeutic Exercise:   See flow sheet :   Rationale: increase ROM and increase strength to improve the patient???s ability to stand and sit at work without pain              With    TE    TA    neuro    other: Patient Education:  Review HEP     Progressed/Changed HEP based on:    positioning    body mechanics    transfers    heat/ice application     other:        Other Objective/Functional Measures:--    Pain Level (0-10 scale) post treatment: 3/10      ASSESSMENT:        See Plan of Care      [name], PT,DPT 05/11/2016  11:31 AM

## 2016-05-11 NOTE — Progress Notes (Signed)
[facility]  [address], [address]  [place], [place] [id number]  Phone: [phone]  Fax: [phone]    Plan of Care/ Statement of Necessity for Physical Therapy Services 2-15    Patient name: Natalie CaddyDanita Kronberg  DOB: 01/19/1966  Provider#: [id number]  Referral source: Calvert CantorHarrison, Kevin C, MD      Medical/Treatment Diagnosis: Low back pain [M54.5]     Prior Hospitalization: see medical history     Comorbidities: None  Prior Level of Function: WNL  Medications: Verified on Patient Summary List    Start of Care: 05/11/2016      Onset Date: 11/28/2015       The Plan of Care and following information is based on the information from the initial evaluation.  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  She presents with global decreased sensation and reflex of the right LE from L3-S1, decreased lumbar and LE strength, pain, decreased ROM and flexibility of the paraspinals and LE.  She will benefit from skilled PT to address these deficits so that she can return to her ADL's and work duties at her [employer].    Evaluation Complexity History LOW Complexity : Zero comorbidities / personal factors that will impact the outcome / POC; Examination LOW Complexity : 1-2 Standardized tests and measures addressing body structure, function, activity limitation and / or participation in recreation  ;Presentation MEDIUM Complexity : Evolving with changing characteristics  ;Clinical Decision Making MEDIUM Complexity : FOTO score of 26-74  Overall Complexity Rating: LOW     Problem List: pain affecting function, decrease ROM, decrease strength, impaired gait/ balance, decrease ADL/ functional abilitiies, decrease activity tolerance and decrease flexibility/ joint mobility   Treatment Plan may include any combination of the following: Therapeutic  exercise, Therapeutic activities, Neuromuscular re-education, Physical agent/modality, Gait/balance training, Manual therapy, Patient education and Functional mobility training  Patient / Family readiness to learn indicated by: asking questions, trying to perform skills and interest  Persons(s) to be included in education: patient (P)  Barriers to Learning/Limitations: None  Patient Goal (s): ???to eliminate pain???  Patient Self Reported Health Status: good  Rehabilitation Potential: good    Short Term Goals: To be accomplished in 4  weeks:  Patient will be independent in HEP  Patient will increase lumbar ROM by 25% so that she can perform all cooking without difficulty  Patient will decrease peripheral Sx by 50% so that she can sit for >30 minutes without pain to drive  Long Term Goals: To be accomplished in 12 weeks:  Patient will increase LE strength by 1 muscle grade so that she can stand for >1 hour to work  Patient will eliminate peripheral Sx so that she can sit/bend to perform work duties  Patient will decrease pain to <2/10 so that she can perform all household chores without pain  Patient will increase lumbar ROM by 50% so that she can bend to floor to pick up purse and donn shoes  Frequency / Duration: Patient to be seen 2 times per week for 12 weeks.    Patient/ Caregiver education and instruction: activity modification and exercises      Plan of care has been reviewed with PTA    Christy GentlesAvery D Rhyen Mazariego, PT,DPT 05/11/2016 1:26 PM    ________________________________________________________________________    I certify that the above Therapy Services are being furnished while the patient  is under my care. I agree with the treatment plan and certify that this therapy is necessary.    75 Signature:____________________  Date:____________Time: _________

## 2016-05-15 ENCOUNTER — Encounter
Payer: PRIVATE HEALTH INSURANCE | Attending: Rehabilitative and Restorative Service Providers" | Primary: Internal Medicine

## 2016-05-19 ENCOUNTER — Inpatient Hospital Stay: Payer: PRIVATE HEALTH INSURANCE | Primary: Internal Medicine

## 2016-05-21 ENCOUNTER — Encounter: Payer: PRIVATE HEALTH INSURANCE | Primary: Internal Medicine

## 2016-05-22 ENCOUNTER — Encounter: Payer: PRIVATE HEALTH INSURANCE | Primary: Internal Medicine

## 2016-05-26 ENCOUNTER — Inpatient Hospital Stay: Admit: 2016-05-26 | Payer: PRIVATE HEALTH INSURANCE | Primary: Internal Medicine

## 2016-05-26 DIAGNOSIS — M545 Low back pain: Secondary | ICD-10-CM

## 2016-05-26 NOTE — Progress Notes (Signed)
PT DAILY TREATMENT NOTE 2-15    Patient Name: [name]  Date:05/26/2016  DOB: March 24, 1966    Patient DOB Verified  Payor: Monia Pouch / Plan: [employer] [place] EMPLOYEE PLAN / Product Type: PPO /    In time:4:00p  Out time:5:00p  Total Treatment Time (min): 60  Visit #: 2     Treatment Area: Low back pain [M54.5]    SUBJECTIVE  Pain Level (0-10 scale): 6-7  Any medication changes, allergies to medications, adverse drug reactions, diagnosis change, or new procedure performed?:  No     Yes (see summary sheet for update)  Subjective functional status/changes:    No changes reported  Patient reports she is feeling more pain through her back as she has not been able to come in for a while.    OBJECTIVE    Modality rationale: decrease pain and increase tissue extensibility to improve the patient???s ability to sit, stand, ambulate, lift, carry, reach and complete ADL's   Min Type Additional Details     Estim: Att   Unatt        TENS instruct                  IFC  Premod   NMES                     Other:  w/US   w/ice   w/heat  Position:  Location:   15   Traction:  Cervical       Lumbar                        Prone          Supine                       Intermittent   Continuous Lbs:65/25   before manual   after manual  w/heat      Ultrasound: Continuous    Pulsed at:                              Location:  W/cm2:      Paraffin         Location:  w/heat      Ice       Heat    Ice massage Position:  Location:      Laser    Other: Position:  Location:      Vasopneumatic Device Pressure:        lo  med  hi   Temperature:     Skin assessment post-treatment:  intact redness- no adverse reaction    redness ??? adverse reaction:     30 min Therapeutic Exercise:   See flow sheet :   Rationale: increase ROM and increase strength to improve the patient???s ability to sit, stand, ambulate, lift, carry, reach and complete ADL's    15 min Neuromuscular Re-education:    See flow sheet :    Rationale: increase ROM and increase strength  to improve the patient???s ability to sit, stand, ambulate, lift, carry, reach and complete ADL's     With    TE    TA    neuro    other: Patient Education:  Review HEP     Progressed/Changed HEP based on:    positioning    body mechanics    transfers  heat/ice application     other:      Other Objective/Functional Measures: no pain with advanced interventions     Pain Level (0-10 scale) post treatment: 3    ASSESSMENT/Changes in Function:     Patient will continue to benefit from skilled PT services to modify and progress therapeutic interventions, address functional mobility deficits, address ROM deficits, address strength deficits, analyze and address soft tissue restrictions, analyze and cue movement patterns, analyze and modify body mechanics/ergonomics and assess and modify postural abnormalities to attain remaining goals.       See Plan of Care    See progress note/recertification    See Discharge Summary         Progress towards goals / Updated goals:  Patient demonstrates good tolerance for all therapeutic exercises despite high levels of pain and will do well with continued progression as tolerated.     PLAN    Upgrade activities as tolerated       Continue plan of care    Update interventions per flow sheet         Discharge due to:_    Other:_      Leona Carryabatha Meriem Lemieux 05/26/2016  4:19 PM

## 2016-05-28 ENCOUNTER — Encounter: Payer: PRIVATE HEALTH INSURANCE | Primary: Internal Medicine

## 2016-05-29 ENCOUNTER — Inpatient Hospital Stay: Admit: 2016-05-29 | Payer: PRIVATE HEALTH INSURANCE | Primary: Internal Medicine

## 2016-05-29 NOTE — Progress Notes (Signed)
PT DAILY TREATMENT NOTE 2-15    Patient Name: Tanishka Drolet  Date:05/29/2016  DOB: 1965-08-17    Patient DOB Verified  Payor: Monia Pouch / Plan: [employer] [place] EMPLOYEE PLAN / Product Type: PPO /    In time:10:30a  Out time:11:45a  Total Treatment Time (min): 75  Visit #: 3     Treatment Area: Low back pain [M54.5]    SUBJECTIVE  Pain Level (0-10 scale): 3-back, 5-hip  Any medication changes, allergies to medications, adverse drug reactions, diagnosis change, or new procedure performed?:  No     Yes (see summary sheet for update)  Subjective functional status/changes:    No changes reported  Patient reports her R hip is bothering her more today.  Patient states she did work an extra shift at work yesterday.    OBJECTIVE    Modality rationale: decrease pain and increase tissue extensibility to improve the patient???s ability to sit, stand, ambulate, lift, carry, reach and complete ADL's   Min Type Additional Details     Estim: Att   Unatt        TENS instruct                  IFC  Premod   NMES                     Other:  w/US   w/ice   w/heat  Position:  Location:   15   Traction:  Cervical       Lumbar                        Prone          Supine                       Intermittent   Continuous Lbs:65/25   before manual   after manual  w/heat      Ultrasound: Continuous    Pulsed at:                              Location:  W/cm2:      Paraffin         Location:  w/heat      Ice       Heat    Ice massage Position:  Location:      Laser    Other: Position:  Location:      Vasopneumatic Device Pressure:        lo  med  hi   Temperature:     Skin assessment post-treatment:  intact redness- no adverse reaction    redness ??? adverse reaction:     30 min Therapeutic Exercise:   See flow sheet :   Rationale: increase ROM and increase strength to improve the patient???s ability to sit, stand, ambulate, lift, carry, reach and complete ADL's    15 min Neuromuscular Re-education:    See flow sheet :    Rationale: increase ROM and increase strength  to improve the patient???s ability to sit, stand, ambulate, lift, carry, reach and complete ADL's     15 min Manual Therapy: MFR B QL, Erector spinae   Rationale: increase ROM and increase strength  to improve the patient???s ability to sit, stand, ambulate, lift, carry, reach and complete ADL's       With    TE  TA    neuro    other: Patient Education:  Review HEP     Progressed/Changed HEP based on:    positioning    body mechanics    transfers    heat/ice application     other:      Other Objective/Functional Measures: no pain with advanced interventions, mod fatigue     Pain Level (0-10 scale) post treatment: 3/10-hips and back     ASSESSMENT/Changes in Function:     Patient will continue to benefit from skilled PT services to modify and progress therapeutic interventions, address functional mobility deficits, address ROM deficits, address strength deficits, analyze and address soft tissue restrictions, analyze and cue movement patterns, analyze and modify body mechanics/ergonomics and assess and modify postural abnormalities to attain remaining goals.       See Plan of Care    See progress note/recertification    See Discharge Summary         Progress towards goals / Updated goals:   Patient able to tolerate interventions with no increased pain and demonstrates mod fatigue by end of session. Patient required verbal cues for proper mechanics with advanced exercises.    PLAN    Upgrade activities as tolerated       Continue plan of care    Update interventions per flow sheet         Discharge due to:_    Other:_      Leona Carryabatha Laronn Devonshire 05/29/2016  10:39 AM

## 2016-06-01 ENCOUNTER — Inpatient Hospital Stay: Payer: PRIVATE HEALTH INSURANCE | Primary: Internal Medicine

## 2016-06-04 ENCOUNTER — Inpatient Hospital Stay: Admit: 2016-06-04 | Payer: PRIVATE HEALTH INSURANCE | Primary: Internal Medicine

## 2016-06-04 NOTE — Progress Notes (Signed)
PT DAILY TREATMENT NOTE 2-15    Patient Name: [name]  Date:06/04/2016  DOB: Apr 27, 1966    Patient DOB Verified  Payor: Monia PouchAETNA / Plan: [employer] [place] EMPLOYEE PLAN / Product Type: PPO /    In time: 5:45p  Out time:7:00p  Total Treatment Time (min): 75  Visit #: 4     Treatment Area: Low back pain [M54.5]    SUBJECTIVE  Pain Level (0-10 scale): 3-4  Any medication changes, allergies to medications, adverse drug reactions, diagnosis change, or new procedure performed?:  No     Yes (see summary sheet for update)  Subjective functional status/changes:    No changes reported  Patient reports she is very tired today.  Patient reports she has been having the sharp pain less frequently and feels she is making good progress so far.    OBJECTIVE    Modality rationale: decrease pain and increase tissue extensibility to improve the patient???s ability to sit, stand, ambulate, lift, [name], reach and complete ADL's   Min Type Additional Details     Estim: Att   Unatt        TENS instruct                  IFC  Premod   NMES                     Other:  w/US   w/ice   w/heat  Position:  Location:   15   Traction:  Cervical       Lumbar                        Prone          Supine                       Intermittent   Continuous Lbs:65/25   before manual   after manual  w/heat      Ultrasound: Continuous    Pulsed at:                            1MHz   3MHz Location:  W/cm2:      Paraffin         Location:  w/heat      Ice       Heat    Ice massage Position:  Location:      Laser    Other: Position:  Location:      Vasopneumatic Device Pressure:        lo  med  hi   Temperature:     Skin assessment post-treatment:  intact redness- no adverse reaction    redness ??? adverse reaction:     30 min Therapeutic Exercise:   See flow sheet :   Rationale: increase ROM and increase strength to improve the patient???s ability to sit, stand, ambulate, lift, [name], reach and complete ADL's     15 min Neuromuscular Re-education:    See flow sheet :   Rationale: increase ROM and increase strength  to improve the patient???s ability to sit, stand, ambulate, lift, [name], reach and complete ADL's     15 min Manual Therapy: MFR B QL, Erector spinae, R piriformis   Rationale: increase ROM and increase strength  to improve the patient???s ability to sit, stand, ambulate, lift, [name], reach and complete ADL's  With    TE    TA    neuro    other: Patient Education:  Review HEP     Progressed/Changed HEP based on:    positioning    body mechanics    transfers    heat/ice application     other:      Other Objective/Functional Measures: pt reports "intense heat" along lower back once in supine, pt states she has had this feeling earlier this week, but is not like "anything I have ever felt before and has woken me in the middle of the night ." Pt instructed to call MD to report new symptom . Interventions did not increase "heat" symptom with some releif while on traction that came back by the end.    Pain Level (0-10 scale) post treatment: 3/10    ASSESSMENT/Changes in Function:     Patient will continue to benefit from skilled PT services to modify and progress therapeutic interventions, address functional mobility deficits, address ROM deficits, address strength deficits, analyze and address soft tissue restrictions, analyze and cue movement patterns, analyze and modify body mechanics/ergonomics and assess and modify postural abnormalities to attain remaining goals.       See Plan of Care    See progress note/recertification    See Discharge Summary         Progress towards goals / Updated goals:   Patient able to tolerate interventions with no increased pain and demonstrates mod fatigue by end of session. Patient required verbal cues for proper mechanics with advanced exercises.    PLAN    Upgrade activities as tolerated       Continue plan of care    Update interventions per flow sheet         Discharge due to:_     Other:_      [name] 06/04/2016  5:50 PM

## 2016-06-08 ENCOUNTER — Inpatient Hospital Stay: Admit: 2016-06-08 | Payer: PRIVATE HEALTH INSURANCE | Primary: Internal Medicine

## 2016-06-08 NOTE — Progress Notes (Signed)
PT DAILY TREATMENT NOTE 2-15    Patient Name: [name]  Date:06/08/2016  DOB: [birth year]    Patient DOB Verified  Payor: Monia PouchAETNA / Plan: [employer] [place] [employer]OYEE PLAN / Product Type: PPO /    In time: 8:50a  Out time:9:50  Total Treatment Time (min): 60  Visit #: 5    Treatment Area: Low back pain [M54.5]    SUBJECTIVE  Pain Level (0-10 scale): 6  Any medication changes, allergies to medications, adverse drug reactions, diagnosis change, or new procedure performed?:  No     Yes (see summary sheet for update)  Subjective functional status/changes:    No changes reported  Patient reports she has been having more pain since last night and woke up several times.  Patient states she is also having more "catching" sharp pain on right side since yesterday as well.  Patient states she has not done anything different since last session to cause increased symptoms.  Patient reports she continues to have symptoms of "intense heat" along lower back.     OBJECTIVE    Modality rationale: decrease pain and increase tissue extensibility to improve the patient???s ability to sit, stand, ambulate, lift, carry, reach and complete ADL's   Min Type Additional Details   15  Estim: Att   Unatt        TENS instruct                  IFC  Premod   NMES                     Other:  w/US   w/ice   w/heat  Position:supine  Location: low back      Traction:  Cervical       Lumbar                        Prone          Supine                       Intermittent   Continuous Lbs:65/25   before manual   after manual  w/heat      Ultrasound: Continuous    Pulsed at:                            1MHz   3MHz Location:  W/cm2:      Paraffin         Location:  w/heat      Ice       Heat    Ice massage Position:  Location:      Laser    Other: Position:  Location:      Vasopneumatic Device Pressure:        lo  med  hi   Temperature:     Skin assessment post-treatment:  intact redness- no adverse reaction    redness ??? adverse reaction:      15 min Therapeutic Exercise:   See flow sheet :   Rationale: increase ROM and increase strength to improve the patient???s ability to sit, stand, ambulate, lift, carry, reach and complete ADL's    15 min Neuromuscular Re-education:    See flow sheet :   Rationale: increase ROM, increase strength, improve coordination and improve balance  to improve the patient???s ability to sit, stand, ambulate, lift, carry, reach and complete ADL's  15 min Manual Therapy: MFR B QL, Erector spinae, R piriformis   Rationale: increase ROM, decrease pain and increase tissue elasticity  to improve the patient???s ability to sit, stand, ambulate, lift, carry, reach and complete ADL's       With    TE    TA    neuro    other: Patient Education:  Review HEP     Progressed/Changed HEP based on:    positioning    body mechanics    transfers    heat/ice application     other:      Other Objective/Functional Measures: Unable to do traction due to being in use, no relief with modality    Pain Level (0-10 scale) post treatment: 5-6/10    ASSESSMENT/Changes in Function:     Patient will continue to benefit from skilled PT services to modify and progress therapeutic interventions, address functional mobility deficits, address ROM deficits, address strength deficits, analyze and address soft tissue restrictions, analyze and cue movement patterns, analyze and modify body mechanics/ergonomics and assess and modify postural abnormalities to attain remaining goals.       See Plan of Care    See progress note/recertification    See Discharge Summary         Progress towards goals / Updated goals:   Patient with poor tolerance for interventions today and with decreased tolerance with bed mobility. Patient limited by pain and will add exercises back in at next     PLAN    Upgrade activities as tolerated       Continue plan of care    Update interventions per flow sheet         Discharge due to:_    Other:_      Anai Lipson 06/08/2016  8:50 AM

## 2016-06-11 ENCOUNTER — Inpatient Hospital Stay: Admit: 2016-06-11 | Payer: PRIVATE HEALTH INSURANCE | Primary: Internal Medicine

## 2016-06-11 NOTE — Progress Notes (Signed)
PT DAILY TREATMENT NOTE 2-15    Patient Name: [name]  Date:06/11/2016  DOB: 08-02-1965    Patient DOB Verified  Payor: Monia Pouch / Plan: [employer] [place] EMPLOYEE PLAN / Product Type: PPO /    In time: 4:40p Out time:6:00p  Total Treatment Time (min): 80  Visit #: 6    Treatment Area: Low back pain [M54.5]    SUBJECTIVE  Pain Level (0-10 scale): 5  Any medication changes, allergies to medications, adverse drug reactions, diagnosis change, or new procedure performed?:  No     Yes (see summary sheet for update)  Subjective functional status/changes:    No changes reported  Patient she is feeling a little better and is walking better.  Patient states her doctor told her the "heat" that she experience was probably related to nerve pain.  Patient states she started to feel a little better after last session and was able to walk without as much pain.    OBJECTIVE    Modality rationale: decrease pain and increase tissue extensibility to improve the patient???s ability to sit, stand, ambulate, lift, carry, reach and complete ADL's   Min Type Additional Details     Estim: Att   Unatt        TENS instruct                  IFC  Premod   NMES                     Other:  w/US   w/ice   w/heat  Position:supine  Location: low back   15   Traction:  Cervical       Lumbar                        Prone          Supine                       Intermittent   Continuous Lbs:65/25   before manual   after manual  w/heat      Ultrasound: Continuous    Pulsed at:                              Location:  W/cm2:      Paraffin         Location:  w/heat      Ice       Heat    Ice massage Position:  Location:      Laser    Other: Position:  Location:      Vasopneumatic Device Pressure:        lo  med  hi   Temperature:     Skin assessment post-treatment:  intact redness- no adverse reaction    redness ??? adverse reaction:     40 min Therapeutic Exercise:   See flow sheet :    Rationale: increase ROM and increase strength to improve the patient???s ability to sit, stand, ambulate, lift, carry, reach and complete ADL's    25 min Neuromuscular Re-education:    See flow sheet :   Rationale: increase ROM, increase strength, improve coordination and improve balance  to improve the patient???s ability to sit, stand, ambulate, lift, carry, reach and complete ADL's        With    TE    TA  neuro    other: Patient Education:  Review HEP     Progressed/Changed HEP based on:    positioning    body mechanics    transfers    heat/ice application     other:      Other Objective/Functional Measures: Pt states her MD noticed improved reflexes on RLE    AROM Lumbar:  Flexion: 50% (gowers sign)  Extension: 25%, no deviation  SB: to knee bilaterally, min pain R  Rot: 100%, min pain R    MMT R:  Psoas: 3+/5  Quads: 4+/5  Tib Ant: 4/5  EHL: 4+/5  FHL: 4/5  Hams: 4+/5    Pain Level (0-10 scale) post treatment: 4/10     ASSESSMENT/Changes in Function:     Patient will continue to benefit from skilled PT services to modify and progress therapeutic interventions, address functional mobility deficits, address ROM deficits, address strength deficits, analyze and address soft tissue restrictions, analyze and cue movement patterns, analyze and modify body mechanics/ergonomics and assess and modify postural abnormalities to attain remaining goals.       See Plan of Care    See progress note/recertification    See Discharge Summary         Progress towards goals / Updated goals:   Patient with improved exercises tolerance and was able to advance several with no increased pain. Patient has made good progress towards goals with improved overall strength and ROM. Patient will do well with continued PT to address remaining goals and deficits.    Short Term Goals: To be accomplished in 4  weeks:  Patient will be independent in HEP  Patient will increase lumbar ROM by 25% so that she can perform all  cooking without difficulty. Progressing  Patient will decrease peripheral Sx by 50% so that she can sit for >30 minutes without pain to drive Progressing  Long Term Goals: To be accomplished in 12 weeks:  Patient will increase LE strength by 1 muscle grade so that she can stand for >1 hour to work Progressing  Patient will eliminate peripheral Sx so that she can sit/bend to perform work duties  Patient will decrease pain to <2/10 so that she can perform all household chores without pain  Patient will increase lumbar ROM by 50% so that she can bend to floor to pick up purse and donn shoes  Frequency / Duration: Patient to be seen 2 times per week for 12 weeks.    PLAN    Upgrade activities as tolerated       Continue plan of care    Update interventions per flow sheet         Discharge due to:_    Other:_      [name] 06/11/2016  5:50 PM

## 2016-06-12 ENCOUNTER — Encounter
Payer: PRIVATE HEALTH INSURANCE | Attending: Rehabilitative and Restorative Service Providers" | Primary: Internal Medicine

## 2016-06-15 ENCOUNTER — Encounter: Payer: PRIVATE HEALTH INSURANCE | Primary: Internal Medicine

## 2016-06-18 ENCOUNTER — Encounter: Payer: PRIVATE HEALTH INSURANCE | Primary: Internal Medicine

## 2016-06-22 ENCOUNTER — Inpatient Hospital Stay: Admit: 2016-06-22 | Payer: PRIVATE HEALTH INSURANCE | Primary: Internal Medicine

## 2016-06-22 NOTE — Progress Notes (Signed)
PT DAILY TREATMENT NOTE 2-15    Patient Name: [name]  Date:06/22/2016  DOB: February 19, 1966    Patient DOB Verified  Payor: Monia Pouch / Plan: [employer] [place] EMPLOYEE PLAN / Product Type: PPO /    In time: 3:50p Out time:5:00p  Total Treatment Time (min): 70  Visit #: 7    Treatment Area: Low back pain [M54.5]    SUBJECTIVE  Pain Level (0-10 scale): 4  Any medication changes, allergies to medications, adverse drug reactions, diagnosis change, or new procedure performed?:  No     Yes (see summary sheet for update)  Subjective functional status/changes:    No changes reported  Patient reports she has been doing her stretches at work and they help her get through they day without high levels of pain.  Patient states occasionally moves the wrong way, like picking something up from floor and bending at the waist that causes a sharp momentary pain.    OBJECTIVE    Modality rationale: decrease pain and increase tissue extensibility to improve the patient???s ability to sit, stand, ambulate, lift, carry, reach and complete ADL's   Min Type Additional Details     Estim: Att   Unatt        TENS instruct                  IFC  Premod   NMES                     Other:  w/US   w/ice   w/heat  Position:supine  Location: low back   15   Traction:  Cervical       Lumbar                        Prone          Supine                       Intermittent   Continuous Lbs:65/25   before manual   after manual  w/heat      Ultrasound: Continuous    Pulsed at:                              Location:  W/cm2:      Paraffin         Location:  w/heat      Ice       Heat    Ice massage Position:  Location:      Laser    Other: Position:  Location:      Vasopneumatic Device Pressure:        lo  med  hi   Temperature:     Skin assessment post-treatment:  intact redness- no adverse reaction    redness ??? adverse reaction:     30 min Therapeutic Exercise:   See flow sheet :    Rationale: increase ROM and increase strength to improve the patient???s ability to sit, stand, ambulate, lift, carry, reach and complete ADL's    25 min Neuromuscular Re-education:    See flow sheet :   Rationale: increase ROM, increase strength, improve coordination and improve balance  to improve the patient???s ability to sit, stand, ambulate, lift, carry, reach and complete ADL's        With    TE    TA  neuro    other: Patient Education:  Review HEP     Progressed/Changed HEP based on:    positioning    body mechanics    transfers    heat/ice application     other:      Other Objective/Functional Measures:     Pain Level (0-10 scale) post treatment: 2    ASSESSMENT/Changes in Function:     Patient will continue to benefit from skilled PT services to modify and progress therapeutic interventions, address functional mobility deficits, address ROM deficits, address strength deficits, analyze and address soft tissue restrictions, analyze and cue movement patterns, analyze and modify body mechanics/ergonomics and assess and modify postural abnormalities to attain remaining goals.       See Plan of Care    See progress note/recertification    See Discharge Summary         Progress towards goals / Updated goals:   Patient with improved exercises tolerance and was able to advance several with no increased pain. Patient required verbal cues for proper exercise reproduction and mechanics.    Short Term Goals: To be accomplished in 4  weeks:  Patient will be independent in HEP  Patient will increase lumbar ROM by 25% so that she can perform all cooking without difficulty. Progressing  Patient will decrease peripheral Sx by 50% so that she can sit for >30 minutes without pain to drive Progressing  Long Term Goals: To be accomplished in 12 weeks:  Patient will increase LE strength by 1 muscle grade so that she can stand for >1 hour to work Progressing  Patient will eliminate peripheral Sx so that she can sit/bend to perform  work duties  Patient will decrease pain to <2/10 so that she can perform all household chores without pain  Patient will increase lumbar ROM by 50% so that she can bend to floor to pick up purse and donn shoes  Frequency / Duration: Patient to be seen 2 times per week for 12 weeks.    PLAN    Upgrade activities as tolerated       Continue plan of care    Update interventions per flow sheet         Discharge due to:_    Other:_      Leona Carryabatha Lucylle Foulkes 06/22/2016  5:50 PM

## 2016-06-25 ENCOUNTER — Encounter: Payer: PRIVATE HEALTH INSURANCE | Primary: Internal Medicine

## 2016-06-29 ENCOUNTER — Inpatient Hospital Stay: Admit: 2016-06-29 | Payer: PRIVATE HEALTH INSURANCE | Primary: Internal Medicine

## 2016-06-29 DIAGNOSIS — M545 Low back pain: Secondary | ICD-10-CM

## 2016-06-29 NOTE — Progress Notes (Signed)
PT DAILY TREATMENT NOTE 2-15    Patient Name: [name]  Date:06/29/2016  DOB: [birth year]    Patient DOB Verified  Payor: Monia PouchAETNA / Plan: [employer] [place] EMPLOYEE PLAN / Product Type: PPO /    In time: 4:30p Out time:5:40p  Total Treatment Time (min): 70  Visit #: 8    Treatment Area: Low back pain [M54.5]    SUBJECTIVE  Pain Level (0-10 scale): 2  Any medication changes, allergies to medications, adverse drug reactions, diagnosis change, or new procedure performed?:  No     Yes (see summary sheet for update)  Subjective functional status/changes:    No changes reported  Patient reports she was given another dose of steroids to help with her pain and feels better.      OBJECTIVE    Modality rationale: decrease pain and increase tissue extensibility to improve the patient???s ability to sit, stand, ambulate, lift, carry, reach and complete ADL's   Min Type Additional Details     Estim: Att   Unatt        TENS instruct                  IFC  Premod   NMES                     Other:  w/US   w/ice   w/heat  Position:supine  Location: low back   15   Traction:  Cervical       Lumbar                        Prone          Supine                       Intermittent   Continuous Lbs:65/35   before manual   after manual  w/heat      Ultrasound: Continuous    Pulsed at:                            1MHz   3MHz Location:  W/cm2:      Paraffin         Location:  w/heat      Ice       Heat    Ice massage Position:  Location:      Laser    Other: Position:  Location:      Vasopneumatic Device Pressure:        lo  med  hi   Temperature:     Skin assessment post-treatment:  intact redness- no adverse reaction    redness ??? adverse reaction:     30 min Therapeutic Exercise:   See flow sheet :   Rationale: increase ROM and increase strength to improve the patient???s ability to sit, stand, ambulate, lift, carry, reach and complete ADL's    25 min Neuromuscular Re-education:    See flow sheet :    Rationale: increase ROM, increase strength, improve coordination and improve balance  to improve the patient???s ability to sit, stand, ambulate, lift, carry, reach and complete ADL's        With    TE    TA    neuro    other: Patient Education:  Review HEP     Progressed/Changed HEP based on:    positioning    body mechanics  transfers    heat/ice application     other:      Other Objective/Functional Measures: pt able to tolerate several standing exercises with mod fatigue noted, pain with hip hinges,     Pain Level (0-10 scale) post treatment: 1-2    ASSESSMENT/Changes in Function:     Patient will continue to benefit from skilled PT services to modify and progress therapeutic interventions, address functional mobility deficits, address ROM deficits, address strength deficits, analyze and address soft tissue restrictions, analyze and cue movement patterns, analyze and modify body mechanics/ergonomics and assess and modify postural abnormalities to attain remaining goals.       See Plan of Care    See progress note/recertification    See Discharge Summary         Progress towards goals / Updated goals:   Patient able to advance several standing exercises with no increased pain and will do well with continued focus on core and pelvic stabilization exercises. Patient with mod fatigue with advanced exercises and required verbal cues with increased fatigue for proper mechanics.     Short Term Goals: To be accomplished in 4  weeks:  Patient will be independent in HEP  Patient will increase lumbar ROM by 25% so that she can perform all cooking without difficulty. Progressing  Patient will decrease peripheral Sx by 50% so that she can sit for >30 minutes without pain to drive Progressing  Long Term Goals: To be accomplished in 12 weeks:  Patient will increase LE strength by 1 muscle grade so that she can stand for >1 hour to work Progressing  Patient will eliminate peripheral Sx so that she can sit/bend to perform  work duties Progressing  Patient will decrease pain to <2/10 so that she can perform all household chores without pain Progressing  Patient will increase lumbar ROM by 50% so that she can bend to floor to pick up purse and donn shoes Progressing  Frequency / Duration: Patient to be seen 2 times per week for 12 weeks.    PLAN    Upgrade activities as tolerated       Continue plan of care    Update interventions per flow sheet         Discharge due to:_    Other:_      Leona Carry 06/29/2016  5:50 PM

## 2016-07-03 ENCOUNTER — Encounter: Payer: PRIVATE HEALTH INSURANCE | Primary: Internal Medicine

## 2016-07-06 ENCOUNTER — Inpatient Hospital Stay: Admit: 2016-07-06 | Payer: PRIVATE HEALTH INSURANCE | Primary: Internal Medicine

## 2016-07-06 NOTE — Progress Notes (Signed)
PT DAILY TREATMENT NOTE 2-15    Patient Name: [name]  Date:07/06/2016  DOB: 02-Jun-1965    Patient DOB Verified  Payor: Holland Falling / Plan: [employer] [place] EMPLOYEE PLAN / Product Type: PPO /    In time: 9:00a Out time:10:10a  Total Treatment Time (min): 70  Visit #: 9    Treatment Area: Low back pain [M54.5]    SUBJECTIVE  Pain Level (0-10 scale): 0  Any medication changes, allergies to medications, adverse drug reactions, diagnosis change, or new procedure performed?:  No     Yes (see summary sheet for update)  Subjective functional status/changes:    No changes reported  Patient reports she does not have any pain today, but does have a little soreness.  Patient states she was a little sore after last visit into the next day, but did not get any pain.      OBJECTIVE    Modality rationale: decrease pain and increase tissue extensibility to improve the patient???s ability to sit, stand, ambulate, lift, carry, reach and complete ADL's   Min Type Additional Details     Estim: Att   Unatt        TENS instruct                  IFC  Premod   NMES                     Other:  w/US   w/ice   w/heat  Position:supine  Location: low back   15   Traction:  Cervical       Lumbar                        Prone          Supine                       Intermittent   Continuous Lbs:65/35   before manual   after manual  w/heat      Ultrasound: Continuous    Pulsed at:                            1MHz   3MHz Location:  W/cm2:      Paraffin         Location:  w/heat      Ice       Heat    Ice massage Position:  Location:      Laser    Other: Position:  Location:      Vasopneumatic Device Pressure:        lo  med  hi   Temperature:     Skin assessment post-treatment:  intact redness- no adverse reaction    redness ??? adverse reaction:     30 min Therapeutic Exercise:   See flow sheet :   Rationale: increase ROM and increase strength to improve the patient???s ability to sit, stand, ambulate, lift, carry, reach and complete ADL's     25 min Neuromuscular Re-education:    See flow sheet :   Rationale: increase ROM, increase strength, improve coordination and improve balance  to improve the patient???s ability to sit, stand, ambulate, lift, carry, reach and complete ADL's        With    TE    TA    neuro    other: Patient Education:  Review HEP  Progressed/Changed HEP based on:    positioning    body mechanics    transfers    heat/ice application     other:      Other Objective/Functional Measures: pt able to tolerate several standing exercises with mod fatigue noted     Pain Level (0-10 scale) post treatment: 0    ASSESSMENT/Changes in Function:     Patient will continue to benefit from skilled PT services to modify and progress therapeutic interventions, address functional mobility deficits, address ROM deficits, address strength deficits, analyze and address soft tissue restrictions, analyze and cue movement patterns, analyze and modify body mechanics/ergonomics and assess and modify postural abnormalities to attain remaining goals.       See Plan of Care    See progress note/recertification    See Discharge Summary         Progress towards goals / Updated goals:   Patient able to advance several standing exercises with no increased pain and will do well with continued focus on core and pelvic stabilization exercises. Patient with mod fatigue with advanced exercises and required verbal cues with increased fatigue for proper mechanics.     Short Term Goals: To be accomplished in 4  weeks:  Patient will be independent in HEP Met  Patient will increase lumbar ROM by 25% so that she can perform all cooking without difficulty. Progressing  Patient will decrease peripheral Sx by 50% so that she can sit for >30 minutes without pain to drive Progressing  Long Term Goals: To be accomplished in 12 weeks:  Patient will increase LE strength by 1 muscle grade so that she can stand for >1 hour to work Progressing   Patient will eliminate peripheral Sx so that she can sit/bend to perform work duties Progressing  Patient will decrease pain to <2/10 so that she can perform all household chores without pain Progressing  Patient will increase lumbar ROM by 50% so that she can bend to floor to pick up purse and donn shoes Progressing  Frequency / Duration: Patient to be seen 2 times per week for 12 weeks.    PLAN    Upgrade activities as tolerated       Continue plan of care    Update interventions per flow sheet         Discharge due to:_    Other:_      Abhinav Mayorquin 07/06/2016  9:04 AM

## 2016-07-09 ENCOUNTER — Inpatient Hospital Stay: Admit: 2016-07-09 | Payer: PRIVATE HEALTH INSURANCE | Primary: Internal Medicine

## 2016-07-09 NOTE — Progress Notes (Signed)
PT DAILY TREATMENT NOTE 2-15    Patient Name: [name]  Date:07/09/2016  DOB: 31-Mar-1966    Patient DOB Verified  Payor: [employer] Falling / Plan: [employer] [place] EMPLOYEE PLAN / Product Type: PPO /    In time: 5:00p Out time: 6:15p  Total Treatment Time (min): 75  Visit #: 10    Treatment Area: Low back pain [M54.5]    SUBJECTIVE  Pain Level (0-10 scale): 0  Any medication changes, allergies to medications, adverse drug reactions, diagnosis change, or new procedure performed?:  No     Yes (see summary sheet for update)  Subjective functional status/changes:    No changes reported  Patient reports she has been doing well and was sore after last visit, but has not had any sharp pain.      OBJECTIVE    Modality rationale: decrease pain and increase tissue extensibility to improve the patient???s ability to sit, stand, ambulate, lift, carry, reach and complete ADL's   Min Type Additional Details     Estim: Att   Unatt        TENS instruct                  IFC  Premod   NMES                     Other:  w/US   w/ice   w/heat  Position:supine  Location: low back   15   Traction:  Cervical       Lumbar                        Prone          Supine                       Intermittent   Continuous Lbs:65/35   before manual   after manual  w/heat      Ultrasound: Continuous    Pulsed at:                            1MHz   3MHz Location:  W/cm2:      Paraffin         Location:  w/heat      Ice       Heat    Ice massage Position:  Location:      Laser    Other: Position:  Location:      Vasopneumatic Device Pressure:        lo  med  hi   Temperature:     Skin assessment post-treatment:  intact redness- no adverse reaction    redness ??? adverse reaction:     30 min Therapeutic Exercise:   See flow sheet :   Rationale: increase ROM and increase strength to improve the patient???s ability to sit, stand, ambulate, lift, carry, reach and complete ADL's    30 min Neuromuscular Re-education:    See flow sheet :    Rationale: increase ROM, increase strength, improve coordination and improve balance  to improve the patient???s ability to sit, stand, ambulate, lift, carry, reach and complete ADL's        With    TE    TA    neuro    other: Patient Education:  Review HEP     Progressed/Changed HEP based on:    positioning  body mechanics    transfers    heat/ice application     other:      Other Objective/Functional Measures: no pain with interventions    Pain Level (0-10 scale) post treatment: 0     ASSESSMENT/Changes in Function:     Patient will continue to benefit from skilled PT services to modify and progress therapeutic interventions, address functional mobility deficits, address ROM deficits, address strength deficits, analyze and address soft tissue restrictions, analyze and cue movement patterns, analyze and modify body mechanics/ergonomics and assess and modify postural abnormalities to attain remaining goals.       See Plan of Care    See progress note/recertification    See Discharge Summary         Progress towards goals / Updated goals:   Patient able to advance several standing exercises with no increased pain and will do well with continued focus on core and pelvic stabilization exercises. Patient with mod fatigue with advanced exercises and required verbal cues with increased fatigue for proper mechanics. Will go over goals and take measurements at next visit.    Short Term Goals: To be accomplished in 4  weeks:  Patient will be independent in HEP Met  Patient will increase lumbar ROM by 25% so that she can perform all cooking without difficulty. Progressing  Patient will decrease peripheral Sx by 50% so that she can sit for >30 minutes without pain to drive Progressing  Long Term Goals: To be accomplished in 12 weeks:  Patient will increase LE strength by 1 muscle grade so that she can stand for >1 hour to work Progressing  Patient will eliminate peripheral Sx so that she can sit/bend to perform  work duties Progressing  Patient will decrease pain to <2/10 so that she can perform all household chores without pain Progressing  Patient will increase lumbar ROM by 50% so that she can bend to floor to pick up purse and donn shoes Progressing  Frequency / Duration: Patient to be seen 2 times per week for 12 weeks.    PLAN    Upgrade activities as tolerated       Continue plan of care    Update interventions per flow sheet         Discharge due to:_    Other:_      Silverio Lay 07/09/2016  5:04 PM

## 2016-07-16 ENCOUNTER — Encounter: Payer: PRIVATE HEALTH INSURANCE | Primary: Internal Medicine

## 2016-07-17 ENCOUNTER — Encounter
Payer: PRIVATE HEALTH INSURANCE | Attending: Rehabilitative and Restorative Service Providers" | Primary: Internal Medicine

## 2016-07-23 ENCOUNTER — Inpatient Hospital Stay: Payer: PRIVATE HEALTH INSURANCE | Primary: Internal Medicine

## 2016-08-05 ENCOUNTER — Encounter
Payer: PRIVATE HEALTH INSURANCE | Attending: Rehabilitative and Restorative Service Providers" | Primary: Internal Medicine

## 2016-08-10 ENCOUNTER — Encounter: Payer: PRIVATE HEALTH INSURANCE | Primary: Internal Medicine

## 2016-08-13 ENCOUNTER — Inpatient Hospital Stay: Payer: PRIVATE HEALTH INSURANCE | Primary: Internal Medicine

## 2016-08-17 ENCOUNTER — Inpatient Hospital Stay: Admit: 2016-08-17 | Payer: PRIVATE HEALTH INSURANCE | Primary: Internal Medicine

## 2016-08-17 DIAGNOSIS — M545 Low back pain: Secondary | ICD-10-CM

## 2016-08-17 NOTE — Progress Notes (Signed)
[facility]  [address], [address]  [place], [place] [id number]  Phone: [phone]      Fax:  [phone]    Progress Note    Name: [name]   DOB: [birth year]   MD: Calvert CantorHarrison, Kevin C, MD       Treatment Diagnosis: Low back pain [M54.5]  Start of Care: 05/11/16    Visits from Start of Care: 12  Missed Visits: 2    Summary of Care:Patient has been seen for 12 visits for low back pain.  She is making steady progress towards her strength, ROM, and pain goals will continue to benefit from skilled PT in order to perform all ADL's and work duties at [employer].    Assessment / Recommendations:     Short Term Goals:??To be accomplished in 4 ??weeks:  Patient will be independent in HEP  Patient will increase lumbar ROM by 25% so that she can perform all cooking without difficulty. Progressing  Patient will decrease peripheral Sx by 50% so that she can sit for >30 minutes without pain to drive Progressing  Long Term Goals:??To be accomplished in 12??weeks:  Patient will increase LE strength by 1 muscle grade so that she can stand for >1 hour to work Progressing  Patient will eliminate peripheral Sx so that she can sit/bend to perform work duties Progressing  Patient will decrease pain to <2/10 so that she can perform all household chores without pain Progressing  Patient will increase lumbar ROM by 50% so that she can bend to floor to pick up purse and donn shoes Progressing  Frequency / Duration:??Patient to be seen 2??times per week for 12??weeks.      Christy GentlesAvery D Hollie Wojahn, PT 08/25/2016 7:37 AM    ________________________________________________________________________  NOTE TO PHYSICIAN:  Please complete the following and fax to:  [facility]: [phone]  . Retain this original for your records.  If you are unable to process this request in 24 hours, please contact our office.        ____ I have read the above report and request that my patient continue therapy with the following changes/special instructions:  ____ I have read the above report and request that my patient be discharged from therapy    Physician's Signature:_________________ Date:___________Time:__________

## 2016-08-17 NOTE — Progress Notes (Signed)
PT DAILY TREATMENT NOTE 2-15    Patient Name: [name]  Date:08/17/2016  DOB: [birth year]  [x]   Patient DOB Verified  Payor: [employer] / Plan: [employer] [place] EMPLOYEE PLAN / Product Type: PPO /    In time: 4:00p Out time: 5:15p  Total Treatment Time (min): 75  Visit #: 11    Treatment Area: Low back pain [M54.5]    SUBJECTIVE  Pain Level (0-10 scale): 5  Any medication changes, allergies to medications, adverse drug reactions, diagnosis change, or new procedure performed?: [x]  No    []  Yes (see summary sheet for update)  Subjective functional status/changes:   []  No changes reported  Patient reports she has not been in due to illness and family emergency, but has been keeping up with HEP.  Patient states she is no longer taking Prednisone and has also stopped taking Motrin.  Patient states her MD started her on Celebrex which she feels has been helping control/manage her pain.       OBJECTIVE    Modality rationale: decrease pain and increase tissue extensibility to improve the patient???s ability to sit, stand, ambulate, lift, carry, reach and complete ADL's   Min Type Additional Details    []  Estim: [] Att   [x] Unatt        [] TENS instruct                  [] IFC  [] Premod   [] NMES                     [] Other:  [] w/US   [] w/ice   [x] w/heat  Position:supine  Location: low back   15 [x]   Traction: []  Cervical       [x] Lumbar                       []  Prone          [x] Supine                       [x] Intermittent   [] Continuous Lbs:55/30  []  before manual  []  after manual  [] w/heat    []   Ultrasound: [] Continuous   []  Pulsed at:                            [] 1MHz   [] 3MHz Location:  W/cm2:    []   Paraffin         Location:  [] w/heat    []   Ice     []   Heat  []   Ice massage Position:  Location:    []   Laser  []   Other: Position:  Location:    []   Vasopneumatic Device Pressure:       []  lo []  med []  hi   Temperature:    [x]  Skin assessment post-treatment:  [x] intact [] redness- no adverse reaction     [] redness ??? adverse reaction:     30 min Therapeutic Exercise:  [x]  See flow sheet :   Rationale: increase ROM and increase strength to improve the patient???s ability to sit, stand, ambulate, lift, carry, reach and complete ADL's    30 min Neuromuscular Re-education:  [x]   See flow sheet :   Rationale: increase ROM, increase strength, improve coordination and improve balance  to improve the patient???s ability to sit, stand, ambulate, lift, carry, reach and complete ADL's        With   [  x] TE   []  TA   []  neuro   []  other: Patient Education: [x]  Review HEP    [x]  Progressed/Changed HEP based on:   []  positioning   [x]  body mechanics   []  transfers   []  heat/ice application    []  other:      Other Objective/Functional Measures: no pain with interventions  AROM Lumbar :  Flexion: 75%  Extension: to neutral  SB: R to knee, pain L just past knee, no pain  Rot: 100%    MMT:   Psoas: 4/5 B  Quads: 5/5 B  HS: 5/5 B  Tib Ant: 4+/5 B  EHL: 5/5 B  FHL: 5/5 B      Pain Level (0-10 scale) post treatment: 5    ASSESSMENT/Changes in Function:     Patient will continue to benefit from skilled PT services to modify and progress therapeutic interventions, address functional mobility deficits, address ROM deficits, address strength deficits, analyze and address soft tissue restrictions, analyze and cue movement patterns, analyze and modify body mechanics/ergonomics and assess and modify postural abnormalities to attain remaining goals.     []   See Plan of Care  [x]   See progress note/recertification  []   See Discharge Summary         Progress towards goals / Updated goals:   Patient abel to tolerate interventions well despite increased pain and time since last session. Patient has made progress with lumbar ROM and has met several goals. Patient continues to have peripheral symptoms that vary in level of pain, however is able to manage symptoms well with interventions. Patient with improved overall strength bilaterally and  demonstrates increased fatigue R>L LE and will do well with continued PT to continue to progress strength and endurance.    Short Term Goals: To be accomplished in 4  weeks:  Patient will be independent in HEP Met  Patient will increase lumbar ROM by 25% so that she can perform all cooking without difficulty. Met  Patient will decrease peripheral Sx by 50% so that she can sit for >30 minutes without pain to drive Progressing  Long Term Goals: To be accomplished in 12 weeks:  Patient will increase LE strength by 1 muscle grade so that she can stand for >1 hour to work Met  Patient will eliminate peripheral Sx so that she can sit/bend to perform work duties Progressing  Patient will decrease pain to <2/10 so that she can perform all household chores without pain Progressing  Patient will increase lumbar ROM by 50% so that she can bend to floor to pick up purse and donn shoes Progressing  Frequency / Duration: Patient to be seen 2 times per week for 12 weeks.    PLAN  [x]   Upgrade activities as tolerated     [x]   Continue plan of care  []   Update interventions per flow sheet       []   Discharge due to:_  []   Other:_      Silverio Lay 08/17/2016  5:04 PM

## 2016-08-20 ENCOUNTER — Encounter: Payer: PRIVATE HEALTH INSURANCE | Primary: Internal Medicine

## 2016-08-24 ENCOUNTER — Inpatient Hospital Stay: Admit: 2016-08-24 | Payer: PRIVATE HEALTH INSURANCE | Primary: Internal Medicine

## 2016-08-24 DIAGNOSIS — M545 Low back pain: Secondary | ICD-10-CM

## 2016-08-24 NOTE — Progress Notes (Signed)
PT DAILY TREATMENT NOTE 2-15    Patient Name: [name]  Date:08/24/2016  DOB: [birth year]  _0   Patient DOB Verified  Payor: [employer] / Plan: [employer] [place] EMPLOYEE PLAN / Product Type: PPO /    In time: 6:00p Out time: 7:10p  Total Treatment Time (min): 70  Visit #: 12    Treatment Area: Low back pain [M54.5]    SUBJECTIVE  Pain Level (0-10 scale): 6  Any medication changes, allergies to medications, adverse drug reactions, diagnosis change, or new procedure performed?: _1  No    _2  Yes (see summary sheet for update)  Subjective functional status/changes:   _3  No changes reported  Patient reports she worked more this weekend and is having more pain as she was not able to take a rest break very often.      OBJECTIVE    Modality rationale: decrease pain and increase tissue extensibility to improve the patient???s ability to sit, stand, ambulate, lift, carry, reach and complete ADL's   Min Type Additional Details   15 _4  Estim: _5 Att   _6 Unatt        _7 TENS instruct                  _8 IFC  _9 Premod   _10 NMES                     _11 Other:  _12 w/US   _13 w/ice   _14 w/heat  Position:supine  Location: low back    _15   Traction: _16  Cervical       _17 Lumbar                       _18  Prone          _19 Supine                       _20 Intermittent   _21 Continuous Lbs:55/30  _22  before manual  _23  after manual  _24 w/heat    _25   Ultrasound: _26 Continuous   _27  Pulsed at:                            _28 1MHz   _29 3MHz Location:  W/cm2:    _30   Paraffin         Location:  _31 w/heat    _32   Ice     _33   Heat  _34   Ice massage Position:  Location:    _35   Laser  _36   Other: Position:  Location:    _37   Vasopneumatic Device Pressure:       _38  lo _39  med _40  hi   Temperature:    _41  Skin assessment post-treatment:  _42 intact _43 redness- no adverse reaction    _44 redness ??? adverse reaction:     45 min Therapeutic Exercise:  _45  See flow sheet :   Rationale: increase ROM and increase strength to improve the patient???s  ability to sit, stand, ambulate, lift, carry, reach and complete ADL's    10 min Neuromuscular Re-education:  _46   See flow sheet :   Rationale: increase ROM, increase strength, improve coordination and improve balance  to improve the patient???s ability to sit, stand, ambulate, lift, carry, reach and complete ADL's        With   _47  TE   _48  TA   _49  neuro   _50  other: Patient Education: _51  Review HEP    _52  Progressed/Changed HEP based on:   _53   positioning   _0  body mechanics   _1  transfers   <MBWGYKZLDJTTSVXB>_9<\/TJQZESPQZRAQTMAU>_6  heat/ice application    <JFHLKTGYBWLSLHTD>_4<\/KAJGOTLXBWIOMBTD>_9  other:      Other Objective/Functional Measures: no pain with interventions      Pain Level (0-10 scale) post treatment: 3    ASSESSMENT/Changes in Function:     Patient will continue to benefit from skilled PT services to modify and progress therapeutic interventions, address functional mobility deficits, address ROM deficits, address strength deficits, analyze and address soft tissue restrictions, analyze and cue movement patterns, analyze and modify body mechanics/ergonomics and assess and modify postural abnormalities to attain remaining goals.     _4   See Plan of Care  _5   See progress note/recertification  <RCBULAGTXMIWOEHO>_1<\/YYQMGNOIBBCWUGQB>_1   See Discharge Summary         Progress towards goals / Updated goals: Patient did well with therapeutic interventions with concentration on core strength and trunk stabilization.  Patient required verbal cues for proper exercise reproduction and mechanics to prevent increased pain      Short Term Goals:   To be accomplished in 4  weeks:  Patient will be independent in HEP Met  Patient will increase lumbar ROM by 25% so that she can perform all cooking without difficulty. Met  Patient will decrease peripheral Sx by 50% so that she can sit for >30 minutes without pain to drive Progressing  Long Term Goals: To be accomplished in 12 weeks:  Patient will increase LE strength by 1 muscle grade so that she can stand for >1 hour to work Met   Patient will eliminate peripheral Sx so that she can sit/bend to perform work duties Progressing  Patient will decrease pain to <2/10 so that she can perform all household chores without pain Progressing  Patient will increase lumbar ROM by 50% so that she can bend to floor to pick up purse and donn shoes Progressing  Frequency / Duration: Patient to be seen 2 times per week for 12 weeks.    PLAN  _7   Upgrade activities as tolerated     _8   Continue plan of care  _9   Update interventions per flow sheet       _10   Discharge due to:_  _11   Other:_      Michae Grimley 08/24/2016  5:04 PM

## 2016-08-27 ENCOUNTER — Inpatient Hospital Stay: Admit: 2016-08-27 | Payer: PRIVATE HEALTH INSURANCE | Primary: Internal Medicine

## 2016-08-27 NOTE — Progress Notes (Signed)
PT DAILY TREATMENT NOTE 2-15    Patient Name: [name]  Date:08/27/2016  DOB: Dec 17, 1965  [x]  Patient DOB Verified  Payor: [employer] / Plan: [employer] [place] EMPLOYEE PLAN / Product Type: PPO /    In time: 6:00p Out time: 7:00p  Total Treatment Time (min): 60  Visit #: 13    Treatment Area: Low back pain [M54.5]    SUBJECTIVE  Pain Level (0-10 scale):8-9  Any medication changes, allergies to medications, adverse drug reactions, diagnosis change, or new procedure performed?: [x] No    [] Yes (see summary sheet for update)  Subjective functional status/changes:   [] No changes reported  Patient reports she has been feeling good the last several days, but woke this morning on her stomach and her pain has gotten much worse mostly through the L hip.      OBJECTIVE    Modality rationale: decrease pain and increase tissue extensibility to improve the patient???s ability to sit, stand, ambulate, lift, carry, reach and complete ADL's   Min Type Additional Details   15 [x] Estim: []Att   [x]Unatt        []TENS instruct                  [x]IFC  []Premod   []NMES                     []Other:  []w/US   [x]w/ice   []w/heat  Position:supine  Location: low back/L hip    [x]  Traction: [] Cervical       [x]Lumbar                       [] Prone          [x]Supine                       [x]Intermittent   []Continuous Lbs:55/30  [] before manual  [] after manual  []w/heat    []  Ultrasound: []Continuous   [] Pulsed at:                            []1MHz   []3MHz Location:  W/cm2:    []  Paraffin         Location:  []w/heat    []  Ice     []  Heat  []  Ice massage Position:  Location:    []  Laser  []  Other: Position:  Location:    []  Vasopneumatic Device Pressure:       [] lo [] med [] hi   Temperature:    [x] Skin assessment post-treatment:  [x]intact []redness- no adverse reaction    []redness ??? adverse reaction:     15 min Therapeutic Exercise:  [x] See flow sheet :    Rationale: increase ROM and increase strength to improve the patient???s ability to sit, stand, ambulate, lift, carry, reach and complete ADL's    30 min Manual Therapy: MFR, B erector spinae, QL, piriformis, glut med, paraspinals   Rationale: increase ROM, increase strength, improve coordination and improve balance  to improve the patient???s ability to sit, stand, ambulate, lift, carry, reach and complete ADL's        With   [x] TE   [] TA   [] neuro   [] other: Patient Education: [x] Review HEP    [  x] Progressed/Changed HEP based on:   [] positioning   [x] body mechanics   [] transfers   [] heat/ice application    [] other:      Other Objective/Functional Measures:       Pain Level (0-10 scale) post treatment: 3    ASSESSMENT/Changes in Function:     Patient will continue to benefit from skilled PT services to modify and progress therapeutic interventions, address functional mobility deficits, address ROM deficits, address strength deficits, analyze and address soft tissue restrictions, analyze and cue movement patterns, analyze and modify body mechanics/ergonomics and assess and modify postural abnormalities to attain remaining goals.     []  See Plan of Care  []  See progress note/recertification  []  See Discharge Summary         Progress towards goals / Updated goals: Patient unable to tolerate several exercises due to increased pain. Will advance at next session if tolerable.    Short Term Goals:   To be accomplished in 4  weeks:  Patient will be independent in HEP Met  Patient will increase lumbar ROM by 25% so that she can perform all cooking without difficulty. Met  Patient will decrease peripheral Sx by 50% so that she can sit for >30 minutes without pain to drive Progressing  Long Term Goals: To be accomplished in 12 weeks:  Patient will increase LE strength by 1 muscle grade so that she can stand for >1 hour to work Met  Patient will eliminate peripheral Sx so that she can sit/bend to perform  work duties Progressing  Patient will decrease pain to <2/10 so that she can perform all household chores without pain Progressing  Patient will increase lumbar ROM by 50% so that she can bend to floor to pick up purse and donn shoes Progressing  Frequency / Duration: Patient to be seen 2 times per week for 12 weeks.    PLAN  [x]  Upgrade activities as tolerated     [x]  Continue plan of care  []  Update interventions per flow sheet       []  Discharge due to:_  []  Other:_      Kirston Luty 08/27/2016  5:04 PM

## 2016-09-07 ENCOUNTER — Encounter: Payer: PRIVATE HEALTH INSURANCE | Primary: Internal Medicine

## 2016-09-10 ENCOUNTER — Inpatient Hospital Stay: Payer: PRIVATE HEALTH INSURANCE | Primary: Internal Medicine

## 2016-09-14 ENCOUNTER — Inpatient Hospital Stay: Admit: 2016-09-14 | Payer: PRIVATE HEALTH INSURANCE | Primary: Internal Medicine

## 2016-09-14 NOTE — Progress Notes (Signed)
PT DAILY TREATMENT NOTE 2-15    Patient Name: [name]  Date:09/14/2016  DOB: 03-Mar-1966  [x]   Patient DOB Verified  Payor: [employer]ET[employer] / Plan: [employer] [place] EMPLOYEE PLAN / Product Type: PPO /    In time: 6:00p Out time: 7:00p  Total Treatment Time (min): 60  Visit #: 14    Treatment Area: Low back pain [M54.5]    SUBJECTIVE  Pain Level (0-10 scale): 5  Any medication changes, allergies to medications, adverse drug reactions, diagnosis change, or new procedure performed?: [x]  No    []  Yes (see summary sheet for update)  Subjective functional status/changes:   []  No changes reported  Patient reports she has been feeling good the last several days, but woke this morning on her stomach and her pain has gotten much worse mostly through the L hip.      OBJECTIVE    Modality rationale: decrease pain and increase tissue extensibility to improve the patient???s ability to sit, stand, ambulate, lift, carry, reach and complete ADL's   Min Type Additional Details   15 [x]  Estim: [] Att   [x] Unatt        [] TENS instruct                  [x] IFC  [] Premod   [] NMES                     [] Other:  [] w/US   [] w/ice   [] w/heat  Position:supine  Location: low back/L hip   15 [x]   Traction: []  Cervical       [x] Lumbar                       []  Prone          [x] Supine                       [x] Intermittent   [] Continuous Lbs:60/35  []  before manual  []  after manual  [] w/heat    []   Ultrasound: [] Continuous   []  Pulsed at:                            [] 1MHz   [] 3MHz Location:  W/cm2:    []   Paraffin         Location:  [] w/heat    []   Ice     []   Heat  []   Ice massage Position:  Location:    []   Laser  []   Other: Position:  Location:    []   Vasopneumatic Device Pressure:       []  lo []  med []  hi   Temperature:    [x]  Skin assessment post-treatment:  [x] intact [] redness- no adverse reaction    [] redness ??? adverse reaction:     25 min Therapeutic Exercise:  [x]  See flow sheet :    Rationale: increase ROM and increase strength to improve the patient???s ability to sit, stand, ambulate, lift, carry, reach and complete ADL's        With   [x]  TE   []  TA   []  neuro   []  other: Patient Education: [x]  Review HEP    [x]  Progressed/Changed HEP based on:   []  positioning   [x]  body mechanics   []  transfers   []  heat/ice application    []  other:      Other Objective/Functional Measures: Pt continues to demonstrate slow cautious movement  and is vary hesitant with all transfers.         Pain Level (0-10 scale) post treatment: 3    ASSESSMENT/Changes in Function:     Patient will continue to benefit from skilled PT services to modify and progress therapeutic interventions, address functional mobility deficits, address ROM deficits, address strength deficits, analyze and address soft tissue restrictions, analyze and cue movement patterns, analyze and modify body mechanics/ergonomics and assess and modify postural abnormalities to attain remaining goals.     []   See Plan of Care  []   See progress note/recertification  []   See Discharge Summary         Progress towards goals / Updated goals:  Patient continues to have poor carry over between sessions and is inconsistent with visits. Will continue to monitor patient and possibly discharge next week due to little progress seen.    Short Term Goals:   To be accomplished in 4  weeks:  Patient will be independent in HEP Met  Patient will increase lumbar ROM by 25% so that she can perform all cooking without difficulty. Met  Patient will decrease peripheral Sx by 50% so that she can sit for >30 minutes without pain to drive Progressing  Long Term Goals: To be accomplished in 12 weeks:  Patient will increase LE strength by 1 muscle grade so that she can stand for >1 hour to work Met  Patient will eliminate peripheral Sx so that she can sit/bend to perform work duties Progressing  Patient will decrease pain to <2/10 so that she can perform all household  chores without pain Progressing  Patient will increase lumbar ROM by 50% so that she can bend to floor to pick up purse and donn shoes Progressing  Frequency / Duration: Patient to be seen 2 times per week for 12 weeks.    PLAN  [x]   Upgrade activities as tolerated     [x]   Continue plan of care  []   Update interventions per flow sheet       []   Discharge due to:_  []   Other:_      Siriah Treat 09/14/2016  5:04 PM

## 2016-09-17 ENCOUNTER — Inpatient Hospital Stay: Admit: 2016-09-17 | Payer: PRIVATE HEALTH INSURANCE | Primary: Internal Medicine

## 2016-09-17 NOTE — Progress Notes (Signed)
PT DAILY TREATMENT NOTE 2-15    Patient Name: [name]  Date:09/17/2016  DOB: 1965/08/26  [x]   Patient DOB Verified  Payor: [employer]ET[employer] / Plan: [employer] [place] EMPLOYEE PLAN / Product Type: PPO /    In time: 6:00p Out time: 6:55p  Total Treatment Time (min): 55  Visit #: 16    Treatment Area: Low back pain [M54.5]    SUBJECTIVE  Pain Level (0-10 scale): 1-2  Any medication changes, allergies to medications, adverse drug reactions, diagnosis change, or new procedure performed?: [x]  No    []  Yes (see summary sheet for update)  Subjective functional status/changes:   []  No changes reported  Patient reports she is feeling a little better since last session.  Patient states she got a home TEN's unit.  Patient compliant with HEP and reports feeling better since starting HEP again.      OBJECTIVE    Modality rationale: declined   Min Type Additional Details    []  Estim: [] Att   [x] Unatt        [] TENS instruct                  [x] IFC  [] Premod   [] NMES                     [] Other:  [] w/US   [] w/ice   [] w/heat  Position:supine  Location: low back/L hip    []   Traction: []  Cervical       [x] Lumbar                       []  Prone          [x] Supine                       [x] Intermittent   [] Continuous Lbs:60/35  []  before manual  []  after manual  [] w/heat    []   Ultrasound: [] Continuous   []  Pulsed at:                            [] 1MHz   [] 3MHz Location:  W/cm2:    []   Paraffin         Location:  [] w/heat    []   Ice     []   Heat  []   Ice massage Position:  Location:    []   Laser  []   Other: Position:  Location:    []   Vasopneumatic Device Pressure:       []  lo []  med []  hi   Temperature:    [x]  Skin assessment post-treatment:  [x] intact [] redness- no adverse reaction    [] redness ??? adverse reaction:     55 min Therapeutic Exercise:  [x]  See flow sheet :   Rationale: increase ROM and increase strength to improve the patient???s ability to sit, stand, ambulate, lift, carry, reach and complete ADL's        With   [x]  TE   []  TA    []  neuro   []  other: Patient Education: [x]  Review HEP    [x]  Progressed/Changed HEP based on:   []  positioning   [x]  body mechanics   []  transfers   []  heat/ice application    []  other:      Other Objective/Functional Measures: Pt continues to demonstrate slow cautious movement and is vary hesitant with all transfers.         Pain Level (  0-10 scale) post treatment: 0    ASSESSMENT/Changes in Function:     Patient will continue to benefit from skilled PT services to modify and progress therapeutic interventions, address functional mobility deficits, address ROM deficits, address strength deficits, analyze and address soft tissue restrictions, analyze and cue movement patterns, analyze and modify body mechanics/ergonomics and assess and modify postural abnormalities to attain remaining goals.     []   See Plan of Care  []   See progress note/recertification  []   See Discharge Summary         Progress towards goals / Updated goals:   Patient demonstrates good tolerance with interventions and is able to advance several with no increased pain.  Patient will do well with continued focus on strengthening and endurance to reach remaining goals.    Short Term Goals:   To be accomplished in 4  weeks:  Patient will be independent in HEP Met  Patient will increase lumbar ROM by 25% so that she can perform all cooking without difficulty. Met  Patient will decrease peripheral Sx by 50% so that she can sit for >30 minutes without pain to drive Progressing  Long Term Goals: To be accomplished in 12 weeks:  Patient will increase LE strength by 1 muscle grade so that she can stand for >1 hour to work Met  Patient will eliminate peripheral Sx so that she can sit/bend to perform work duties Progressing  Patient will decrease pain to <2/10 so that she can perform all household chores without pain Progressing  Patient will increase lumbar ROM by 50% so that she can bend to floor to pick up purse and donn shoes Progressing   Frequency / Duration: Patient to be seen 2 times per week for 12 weeks.    PLAN  [x]   Upgrade activities as tolerated     [x]   Continue plan of care  []   Update interventions per flow sheet       []   Discharge due to:_  []   Other:_      Silverio Lay 09/17/2016  5:04 PM

## 2016-09-21 ENCOUNTER — Encounter: Payer: PRIVATE HEALTH INSURANCE | Primary: Internal Medicine

## 2016-09-22 ENCOUNTER — Inpatient Hospital Stay: Admit: 2016-09-22 | Payer: PRIVATE HEALTH INSURANCE | Primary: Internal Medicine

## 2016-09-22 DIAGNOSIS — M545 Low back pain: Secondary | ICD-10-CM

## 2016-09-22 NOTE — Progress Notes (Signed)
PT DAILY TREATMENT NOTE 2-15    Patient Name: [name]  Date:09/22/2016  DOB: 11/25/1965  [x]   Patient DOB Verified  Payor: Holland Falling / Plan: [employer] [place] EMPLOYEE PLAN / Product Type: PPO /    In time: 5:30p Out time: 6:25p  Total Treatment Time (min): 55  Visit #: 17    Treatment Area: Low back pain [M54.5]    SUBJECTIVE  Pain Level (0-10 scale): 3  Any medication changes, allergies to medications, adverse drug reactions, diagnosis change, or new procedure performed?: [x]  No    []  Yes (see summary sheet for update)  Subjective functional status/changes:   []  No changes reported  Patient reports she was sitting more today due to a meeting and is a little more painful through the back, but has not had any increased symptoms since last visit.      OBJECTIVE    Modality rationale: declined   Min Type Additional Details    []  Estim: [] Att   [x] Unatt        [] TENS instruct                  [x] IFC  [] Premod   [] NMES                     [] Other:  [] w/US   [] w/ice   [] w/heat  Position:supine  Location: low back/L hip    []   Traction: []  Cervical       [x] Lumbar                       []  Prone          [x] Supine                       [x] Intermittent   [] Continuous Lbs:60/35  []  before manual  []  after manual  [] w/heat    []   Ultrasound: [] Continuous   []  Pulsed at:                            [] 1MHz   [] 3MHz Location:  W/cm2:    []   Paraffin         Location:  [] w/heat    []   Ice     []   Heat  []   Ice massage Position:  Location:    []   Laser  []   Other: Position:  Location:    []   Vasopneumatic Device Pressure:       []  lo []  med []  hi   Temperature:    [x]  Skin assessment post-treatment:  [x] intact [] redness- no adverse reaction    [] redness ??? adverse reaction:     55 min Therapeutic Exercise:  [x]  See flow sheet :   Rationale: increase ROM and increase strength to improve the patient???s ability to sit, stand, ambulate, lift, carry, reach and complete ADL's        With   [x]  TE   []  TA   []  neuro    []  other: Patient Education: [x]  Review HEP    [x]  Progressed/Changed HEP based on:   []  positioning   [x]  body mechanics   []  transfers   []  heat/ice application    []  other:      Other Objective/Functional Measures: pt demonstrates improved exercise tolerance and decreased caution with transfers.    Pain Level (0-10 scale) post treatment: 1    ASSESSMENT/Changes in Function:  Patient will continue to benefit from skilled PT services to modify and progress therapeutic interventions, address functional mobility deficits, address ROM deficits, address strength deficits, analyze and address soft tissue restrictions, analyze and cue movement patterns, analyze and modify body mechanics/ergonomics and assess and modify postural abnormalities to attain remaining goals.     []   See Plan of Care  []   See progress note/recertification  []   See Discharge Summary         Progress towards goals / Updated goals:   Patient demonstrates good tolerance with interventions and is able to advance several with no increased pain.  Patient will do well with continued focus on strengthening and endurance to reach remaining goals.    Short Term Goals:   To be accomplished in 4  weeks:  Patient will be independent in HEP Met  Patient will increase lumbar ROM by 25% so that she can perform all cooking without difficulty. Met  Patient will decrease peripheral Sx by 50% so that she can sit for >30 minutes without pain to drive Progressing  Long Term Goals: To be accomplished in 12 weeks:  Patient will increase LE strength by 1 muscle grade so that she can stand for >1 hour to work Met  Patient will eliminate peripheral Sx so that she can sit/bend to perform work duties Progressing  Patient will decrease pain to <2/10 so that she can perform all household chores without pain Progressing  Patient will increase lumbar ROM by 50% so that she can bend to floor to pick up purse and donn shoes Progressing   Frequency / Duration: Patient to be seen 2 times per week for 12 weeks.    PLAN  [x]   Upgrade activities as tolerated     [x]   Continue plan of care  []   Update interventions per flow sheet       []   Discharge due to:_  []   Other:_      Danielys Madry 09/22/2016  5:04 PM

## 2016-09-24 ENCOUNTER — Inpatient Hospital Stay: Admit: 2016-09-24 | Payer: PRIVATE HEALTH INSURANCE | Primary: Internal Medicine

## 2016-09-24 NOTE — Progress Notes (Signed)
PT DAILY TREATMENT NOTE 2-15    Patient Name: [name]  Date:09/24/2016  DOB: 03/19/1966  [x]   Patient DOB Verified  Payor: Holland Falling / Plan: [employer] [place] EMPLOYEE PLAN / Product Type: PPO /    In time: 6:00p Out time: 6:55p  Total Treatment Time (min): 55  Visit #: 18    Treatment Area: Low back pain [M54.5]    SUBJECTIVE  Pain Level (0-10 scale): 2  Any medication changes, allergies to medications, adverse drug reactions, diagnosis change, or new procedure performed?: [x]  No    []  Yes (see summary sheet for update)  Subjective functional status/changes:   []  No changes reported  Patient reports she has been a little better since last session.      OBJECTIVE    Modality rationale: declined   Min Type Additional Details    []  Estim: [] Att   [x] Unatt        [] TENS instruct                  [x] IFC  [] Premod   [] NMES                     [] Other:  [] w/US   [] w/ice   [] w/heat  Position:supine  Location: low back/L hip    []   Traction: []  Cervical       [x] Lumbar                       []  Prone          [x] Supine                       [x] Intermittent   [] Continuous Lbs:60/35  []  before manual  []  after manual  [] w/heat    []   Ultrasound: [] Continuous   []  Pulsed at:                            [] 1MHz   [] 3MHz Location:  W/cm2:    []   Paraffin         Location:  [] w/heat    []   Ice     []   Heat  []   Ice massage Position:  Location:    []   Laser  []   Other: Position:  Location:    []   Vasopneumatic Device Pressure:       []  lo []  med []  hi   Temperature:    [x]  Skin assessment post-treatment:  [x] intact [] redness- no adverse reaction    [] redness ??? adverse reaction:     55 min Therapeutic Exercise:  [x]  See flow sheet :   Rationale: increase ROM and increase strength to improve the patient???s ability to sit, stand, ambulate, lift, carry, reach and complete ADL's        With   [x]  TE   []  TA   []  neuro   []  other: Patient Education: [x]  Review HEP    [x]  Progressed/Changed HEP based on:    []  positioning   [x]  body mechanics   []  transfers   []  heat/ice application    []  other:      Other Objective/Functional Measures: pt demonstrates improved exercise tolerance and decreased caution with transfers.    Pain Level (0-10 scale) post treatment: 1    ASSESSMENT/Changes in Function:     Patient will continue to benefit from skilled PT services to modify and progress therapeutic interventions, address  functional mobility deficits, address ROM deficits, address strength deficits, analyze and address soft tissue restrictions, analyze and cue movement patterns, analyze and modify body mechanics/ergonomics and assess and modify postural abnormalities to attain remaining goals.     []   See Plan of Care  []   See progress note/recertification  []   See Discharge Summary         Progress towards goals / Updated goals:   Patient demonstrates good tolerance with interventions and is able to advance several with no increased pain.  Patient will do well with continued focus on strengthening and endurance to reach remaining goals. Patient to schedule 1x next week and schedule a follow up with MD. Will go over goals and measurements next week for discharge due to no change in symptoms and progress.     Short Term Goals:   To be accomplished in 4  weeks:  Patient will be independent in HEP Met  Patient will increase lumbar ROM by 25% so that she can perform all cooking without difficulty. Met  Patient will decrease peripheral Sx by 50% so that she can sit for >30 minutes without pain to drive Progressing  Long Term Goals: To be accomplished in 12 weeks:  Patient will increase LE strength by 1 muscle grade so that she can stand for >1 hour to work Met  Patient will eliminate peripheral Sx so that she can sit/bend to perform work duties Progressing  Patient will decrease pain to <2/10 so that she can perform all household chores without pain Progressing   Patient will increase lumbar ROM by 50% so that she can bend to floor to pick up purse and donn shoes Progressing  Frequency / Duration: Patient to be seen 2 times per week for 12 weeks.    PLAN  [x]   Upgrade activities as tolerated     [x]   Continue plan of care  []   Update interventions per flow sheet       []   Discharge due to:_  []   Other:_      Silverio Lay 09/24/2016  5:04 PM

## 2016-10-01 ENCOUNTER — Encounter: Payer: PRIVATE HEALTH INSURANCE | Primary: Internal Medicine

## 2016-10-13 ENCOUNTER — Inpatient Hospital Stay: Payer: PRIVATE HEALTH INSURANCE | Primary: Internal Medicine

## 2016-10-15 ENCOUNTER — Inpatient Hospital Stay: Payer: PRIVATE HEALTH INSURANCE | Primary: Internal Medicine

## 2016-10-22 ENCOUNTER — Encounter: Payer: PRIVATE HEALTH INSURANCE | Primary: Internal Medicine

## 2016-10-29 ENCOUNTER — Inpatient Hospital Stay: Admit: 2016-10-29 | Payer: PRIVATE HEALTH INSURANCE | Primary: Internal Medicine

## 2016-10-29 DIAGNOSIS — M545 Low back pain: Secondary | ICD-10-CM

## 2016-10-29 NOTE — Progress Notes (Signed)
PT DAILY TREATMENT NOTE 2-15    Patient Name: [name]  Date:10/29/2016  DOB: 10-Feb-1966  [x]   Patient DOB Verified  Payor: Holland Falling / Plan: [employer]  EMPLOYEE PLAN / Product Type: PPO /    In time: 3:00p Out time: 3:45p  Total Treatment Time (min): 45  Visit #: 18    Treatment Area: Low back pain [M54.5]    SUBJECTIVE  Pain Level (0-10 scale): 3  Any medication changes, allergies to medications, adverse drug reactions, diagnosis change, or new procedure performed?: [x]  No    []  Yes (see summary sheet for update)  Subjective functional status/changes:   []  No changes reported  Patient reports she has not been in due to her dog being ill.  Patient reports her MD gave a new script for continued PT.      OBJECTIVE    Modality rationale: declined   Min Type Additional Details    []  Estim: [] Att   [x] Unatt        [] TENS instruct                  [x] IFC  [] Premod   [] NMES                     [] Other:  [] w/US   [] w/ice   [] w/heat  Position:supine  Location: low back/L hip    []   Traction: []  Cervical       [x] Lumbar                       []  Prone          [x] Supine                       [x] Intermittent   [] Continuous Lbs:60/35  []  before manual  []  after manual  [] w/heat    []   Ultrasound: [] Continuous   []  Pulsed at:                            [] 1MHz   [] 3MHz Location:  W/cm2:    []   Paraffin         Location:  [] w/heat    []   Ice     []   Heat  []   Ice massage Position:  Location:    []   Laser  []   Other: Position:  Location:    []   Vasopneumatic Device Pressure:       []  lo []  med []  hi   Temperature:    [x]  Skin assessment post-treatment:  [x] intact [] redness- no adverse reaction    [] redness ??? adverse reaction:     45 min Therapeutic Exercise:  [x]  See flow sheet :   Rationale: increase ROM and increase strength to improve the patient???s ability to sit, stand, ambulate, lift, carry, reach and complete ADL's        With   [x]  TE   []  TA   []  neuro   []  other: Patient Education: [x]  Review HEP     [x]  Progressed/Changed HEP based on:   []  positioning   [x]  body mechanics   []  transfers   []  heat/ice application    []  other:      Other Objective/Functional Measures: pt demonstrate increased fatigue with interventions    Pain Level (0-10 scale) post treatment: 1    ASSESSMENT/Changes in Function:     Patient will continue to benefit from  skilled PT services to modify and progress therapeutic interventions, address functional mobility deficits, address ROM deficits, address strength deficits, analyze and address soft tissue restrictions, analyze and cue movement patterns, analyze and modify body mechanics/ergonomics and assess and modify postural abnormalities to attain remaining goals.     []   See Plan of Care  []   See progress note/recertification  []   See Discharge Summary         Progress towards goals / Updated goals:   Patient demonstrates increased fatigue with interventions and will do well with further progression as tolerated. Will schedule next session with PT [name], due to new script.      Short Term Goals:   To be accomplished in 4  weeks:  Patient will be independent in HEP Met  Patient will increase lumbar ROM by 25% so that she can perform all cooking without difficulty. Met  Patient will decrease peripheral Sx by 50% so that she can sit for >30 minutes without pain to drive Progressing  Long Term Goals: To be accomplished in 12 weeks:  Patient will increase LE strength by 1 muscle grade so that she can stand for >1 hour to work Met  Patient will eliminate peripheral Sx so that she can sit/bend to perform work duties Progressing  Patient will decrease pain to <2/10 so that she can perform all household chores without pain Progressing  Patient will increase lumbar ROM by 50% so that she can bend to floor to pick up purse and donn shoes Progressing  Frequency / Duration: Patient to be seen 2 times per week for 12 weeks.    PLAN  [x]   Upgrade activities as tolerated     [x]   Continue plan of care   []   Update interventions per flow sheet       []   Discharge due to:_  []   Other:_      Gricel Copen 10/29/2016  5:04 PM

## 2016-10-29 NOTE — Progress Notes (Signed)
[facility] [place], [place]  [place], [place]  Phone: [phone]  Fax: [phone]    Discharge Summary  2-15    Patient name: [name]  DOB: [birth year]  Provider#: [id number]  Referral source: Purcell Nails, MD      Medical/Treatment Diagnosis: Low back pain [M54.5]     Prior Hospitalization: see medical history     Comorbidities: See Plan of Care  Prior Level of Function:See Plan of Care  Medications: Verified on Patient Summary List    Start of Care: 05/11/16      Onset Date:2000   Visits from Start of Care: 18     Missed Visits: 0  Reporting Period : 05/11/16 to 10/29/16      ASSESSMENT/SUMMARY OF CARE: Patient seen for 18 visits for ongoing low back pain with radiculopathy.  She has made gains towards her goals when consistent with her visits.  Recently she has had scheduling conflicts and has not returned for follow up in 2 months.      Short Term Goals:??  To be accomplished in 4 ??weeks:  Patient will be independent in HEP Met  Patient will increase lumbar ROM by 25% so that she can perform all cooking without difficulty. Met  Patient will decrease peripheral Sx by 50% so that she can sit for >30 minutes without pain to drive Progressing  Long Term Goals:??To be accomplished in 12??weeks:  Patient will increase LE strength by 1 muscle grade so that she can stand for >1 hour to work Met  Patient will eliminate peripheral Sx so that she can sit/bend to perform work duties Progressing  Patient will decrease pain to <2/10 so that she can perform all household chores without pain Progressing  Patient will increase lumbar ROM by 50% so that she can bend to floor to pick up purse and donn shoes Progressing        RECOMMENDATIONS:  [] Discontinue therapy: [] Patient has reached or is progressing toward set goals      [x] Patient is non-compliant or has abdicated      [] Due to lack of appreciable progress towards set goals      [] Other     Mal Amabile, PT 01/11/2017 11:26 AM

## 2016-11-06 ENCOUNTER — Encounter
Payer: PRIVATE HEALTH INSURANCE | Attending: Rehabilitative and Restorative Service Providers" | Primary: Internal Medicine

## 2016-11-24 ENCOUNTER — Inpatient Hospital Stay
Payer: PRIVATE HEALTH INSURANCE | Attending: Rehabilitative and Restorative Service Providers" | Primary: Internal Medicine

## 2016-12-22 NOTE — Telephone Encounter (Signed)
Attempted to contact patient.  No answer. Left vm stating our records show a medication that are taking may have been recalled and should contact pharmacy regarding this.

## 2017-03-02 MED FILL — LYRICA 50 MG CAPSULE: 50 | 90 days supply | Qty: 270 | Fill #0

## 2017-03-02 MED FILL — VALSARTAN 160 MG TABS: 160 | 60 days supply | Qty: 60 | Fill #0

## 2017-03-02 MED FILL — METAXALONE 800 MG TABLET: 800 | 90 days supply | Qty: 90 | Fill #0

## 2017-03-09 MED FILL — NORETHINDRONE 0.35 MG TAB: 0.35 | 84 days supply | Qty: 84 | Fill #0

## 2017-04-12 MED FILL — AMLODIPINE BESYLATE 2.5 MG: 2.5 | 90 days supply | Qty: 90 | Fill #0

## 2017-04-12 MED FILL — TRIAMTERENE/HCTZ 37.5/25 CP: 37.5-25 | 90 days supply | Qty: 90 | Fill #0

## 2017-04-12 MED FILL — RABEPRAZOLE SOD DR 20 MG TA: 20 | 90 days supply | Qty: 90 | Fill #0

## 2017-04-12 MED FILL — POTASSIUM CL ER 20 MEQ TABL: 20 | 90 days supply | Qty: 270 | Fill #0

## 2017-04-19 DIAGNOSIS — J019 Acute sinusitis, unspecified: Secondary | ICD-10-CM | POA: Diagnosis not present

## 2017-04-19 MED FILL — AMOX-CLAV 875-125 MG TABLET: 875-125 | 7 days supply | Qty: 14 | Fill #0

## 2017-04-27 MED FILL — VALSARTAN 160 MG TABS: 160 | 30 days supply | Qty: 30 | Fill #0

## 2017-05-10 ENCOUNTER — Ambulatory Visit: Payer: 59 | Admitting: Family Medicine

## 2017-05-24 DIAGNOSIS — N3001 Acute cystitis with hematuria: Secondary | ICD-10-CM | POA: Diagnosis not present

## 2017-05-24 MED FILL — NITROFURANTOIN MONO-MCR 100: 100 | 7 days supply | Qty: 14 | Fill #0

## 2017-05-24 MED FILL — PHENAZOPYRIDINE 200 MG TAB: 200 | 3 days supply | Qty: 10 | Fill #0

## 2017-05-24 MED FILL — FLUCONAZOLE 150 MG TABLET: 150 | 3 days supply | Qty: 2 | Fill #0

## 2017-06-02 MED FILL — VALSARTAN 160 MG TABS: 160 | 30 days supply | Qty: 30 | Fill #0

## 2017-06-04 MED FILL — NORETHINDRONE 0.35 MG TAB: 0.35 | 84 days supply | Qty: 84 | Fill #1

## 2017-06-10 ENCOUNTER — Ambulatory Visit (INDEPENDENT_AMBULATORY_CARE_PROVIDER_SITE_OTHER): Payer: 59 | Admitting: Family Medicine

## 2017-06-10 ENCOUNTER — Encounter: Payer: Self-pay | Admitting: Family Medicine

## 2017-06-10 VITALS — BP 100/86 | HR 80 | Temp 98.5°F | Ht 67.0 in | Wt 155.0 lb

## 2017-06-10 DIAGNOSIS — R319 Hematuria, unspecified: Secondary | ICD-10-CM | POA: Diagnosis not present

## 2017-06-10 DIAGNOSIS — G8929 Other chronic pain: Secondary | ICD-10-CM | POA: Diagnosis not present

## 2017-06-10 DIAGNOSIS — E538 Deficiency of other specified B group vitamins: Secondary | ICD-10-CM

## 2017-06-10 DIAGNOSIS — K219 Gastro-esophageal reflux disease without esophagitis: Secondary | ICD-10-CM

## 2017-06-10 DIAGNOSIS — I10 Essential (primary) hypertension: Secondary | ICD-10-CM

## 2017-06-10 DIAGNOSIS — N809 Endometriosis, unspecified: Secondary | ICD-10-CM | POA: Diagnosis not present

## 2017-06-10 DIAGNOSIS — R300 Dysuria: Secondary | ICD-10-CM | POA: Diagnosis not present

## 2017-06-10 DIAGNOSIS — G43809 Other migraine, not intractable, without status migrainosus: Secondary | ICD-10-CM

## 2017-06-10 HISTORY — DX: Essential (primary) hypertension: I10

## 2017-06-10 HISTORY — DX: Other chronic pain: G89.29

## 2017-06-10 HISTORY — DX: Deficiency of other specified B group vitamins: E53.8

## 2017-06-10 MED ORDER — VALSARTAN 160 MG PO TABS: 160.0000 mg | ORAL_TABLET | Freq: Every day | ORAL | 3 refills | Status: DC

## 2017-06-10 MED ORDER — TRIAMTERENE-HCTZ 37.5-25 MG PO TABS: 1.0000 | ORAL_TABLET | Freq: Every day | ORAL | 3 refills | Status: DC

## 2017-06-10 MED ORDER — AMLODIPINE BESYLATE 2.5 MG PO TABS: 2.5000 mg | ORAL_TABLET | Freq: Every day | ORAL | 3 refills | Status: DC

## 2017-06-10 MED ORDER — CYCLOBENZAPRINE HCL 10 MG PO TABS: 10.0000 mg | ORAL_TABLET | Freq: Three times a day (TID) | ORAL | 0 refills | Status: DC | PRN

## 2017-06-10 MED ORDER — POTASSIUM CHLORIDE CRYS ER 20 MEQ PO TBCR: 20.0000 meq | EXTENDED_RELEASE_TABLET | Freq: Three times a day (TID) | ORAL | 3 refills | Status: DC

## 2017-06-10 MED ORDER — NARATRIPTAN HCL 2.5 MG PO TABS: 2.5000 mg | ORAL_TABLET | ORAL | 0 refills | Status: AC | PRN

## 2017-06-10 MED ORDER — PREGABALIN 50 MG PO CAPS: 50.0000 mg | ORAL_CAPSULE | Freq: Three times a day (TID) | ORAL | 0 refills | Status: DC

## 2017-06-10 MED ORDER — IBUPROFEN 800 MG PO TABS: 800.0000 mg | ORAL_TABLET | Freq: Three times a day (TID) | ORAL | 0 refills | Status: DC | PRN

## 2017-06-10 MED FILL — CYCLOBENZAPRINE 10 MG TAB: 10 | 10 days supply | Qty: 30 | Fill #0

## 2017-06-10 MED FILL — IBUPROFEN 800 MG TABS: 800 | 10 days supply | Qty: 30 | Fill #0

## 2017-06-10 NOTE — Patient Instructions (Signed)
BEFORE YOU LEAVE: -labs/urine -follow up: 3-4 months for physical exam  It was nice to meet you today.  Please call around to set up a visit with a gynecologist about the endometriosis.  Let us know who you would like to see for the back and we will send a referral.  We have ordered labs or studies at this visit. It can take up to 1-2 weeks for results and processing. IF results require follow up or explanation, we will call you with instructions. Clinically stable results will be released to your [facility]. If you have not heard from [place] or cannot find your results in [facility] in 2 weeks please contact our office at [phone].  If you are not yet signed up for [facility], please consider signing up.  WE NOW OFFER   [facility] FAST TRACK!!!  SAME DAY Appointments for ACUTE CARE  Such as: Sprains, Injuries, cuts, abrasions, rashes, muscle pain, joint pain, back pain Colds, flu, sore throats, headache, allergies, cough, fever  Ear pain, sinus and eye infections Abdominal pain, nausea, vomiting, diarrhea, upset stomach Animal/insect bites  3 Easy Ways to Schedule: Walk-In Scheduling Call in scheduling Mychart Sign-up: [email or web address]

## 2017-06-10 NOTE — Progress Notes (Addendum)
HPI:  Denise Campos is here to establish care.  Last PCP and physical:   Has the following chronic problems that require follow up and concerns today:  Dysuria: -Treated for UTI at urgent care last month -She has had some mild frequency and urgency recently and wants to recheck her urine -Denies fevers, nausea, vomiting, severe flank pain, vaginal symptoms, hematuria  B12 deficiency: -Takes oral B12  Hypertension: -Medications include aspirin, Diovan, Dyazide and Norvasc, potassium -All chronic medications prescribed by her prior PCP, she requests refills -No chest pain, shortness of breath or dyspnea on exertion  Allergic rhinitis with recurrent strep: -Reports she saw several ear nose and throat doctors in the past, Zyrtec has decreased her strep recurrence  GERD: -History of gastric ulcer remotely -Medications include AcipHex, request refill -Denies any history of dysphasia, stricture, Barrett's or bleeding  Generalized anxiety disorder/insomnia: -Use Xanax rarely in the past, not using currently  Endometriosis: -On norethindrone for this to reduce periods -Plans to establish with gynecology  Migraines: -Uses triptan's regularly, these were related to periods and on the normethadone she has much fewer migraines  Chronic back pain: -Reports history of degenerative disc disease and several back surgeries -She was seeing pain management before moving to [facility] -they were handling her medications and doing injections, she wants referral -deciding on who she wants to see -Medications include Lyrica, Flexeril, Skelaxin sometimes, Mobic -reports the Mobic is the only way that she can work, reports has tolerated these medicines well and request 30-day refill until she gets in with pain management  Addendum 08/13/17: patient wishes to correct record - reports per emails never saw pain management.   "had 2 epidural steroid injections for my back pain....Marland Kitchenhowever, I NEVER  saw or was seen by a pain management clinic. The Ortho referred me to the physician who performed the injections, not a pain clinic."   ROS negative for unless reported above: fevers, unintentional weight loss, hearing or vision loss, chest pain, palpitations, struggling to breath, hemoptysis, melena, hematochezia, hematuria, falls, loc, si, thoughts of self harm  Past Medical History:  Diagnosis Date  . Cancer ([place])   . GERD (gastroesophageal reflux disease)   . History of stomach ulcers   . Hypertension   . Migraines   . UTI (urinary tract infection)     Past Surgical History:  Procedure Laterality Date  . APPENDECTOMY    . TONSILLECTOMY AND ADENOIDECTOMY      History reviewed. No pertinent family history.  Social History   Socioeconomic History  . Marital status: Divorced    Spouse name: None  . Number of children: None  . Years of education: None  . Highest education level: None  Social Needs  . Financial resource strain: None  . Food insecurity - worry: None  . Food insecurity - inability: None  . Transportation needs - medical: None  . Transportation needs - non-medical: None  Occupational History  . None  Tobacco Use  . Smoking status: Never Smoker  . Smokeless tobacco: Never Used  Substance and Sexual Activity  . Alcohol use: No    Frequency: Never  . Drug use: No  . Sexual activity: No  Other Topics Concern  . None  Social History Narrative   Work or School: PICU nurse      Home Situation:      Spiritual Beliefs:      Lifestyle:     Current Outpatient Medications:  .  amLODipine (NORVASC) 2.5 MG tablet, Take  1 tablet (2.5 mg total) by mouth daily., Disp: 90 tablet, Rfl: 3 .  aspirin 81 MG tablet, Take 81 mg by mouth daily., Disp: , Rfl:  .  cetirizine (ZYRTEC) 10 MG tablet, Take 10 mg by mouth daily., Disp: , Rfl:  .  Cholecalciferol (VITAMIN D3) 1000 units CAPS, Take by mouth., Disp: , Rfl:  .  cyclobenzaprine (FLEXERIL) 10 MG tablet, Take 1  tablet (10 mg total) by mouth 3 (three) times daily as needed for muscle spasms., Disp: 30 tablet, Rfl: 0 .  EPINEPHrine (EPIPEN IJ), Inject as directed., Disp: , Rfl:  .  ibuprofen (ADVIL,MOTRIN) 800 MG tablet, Take 1 tablet (800 mg total) by mouth every 8 (eight) hours as needed., Disp: 30 tablet, Rfl: 0 .  Magnesium 500 MG TABS, Take 500 mg by mouth daily., Disp: , Rfl:  .  metaxalone (SKELAXIN) 800 MG tablet, Take 800 mg by mouth 3 (three) times daily., Disp: , Rfl:  .  naratriptan (AMERGE) 2.5 MG tablet, Take 1 tablet (2.5 mg total) by mouth as needed for migraine. Take one (1) tablet at onset of headache; if returns or does not resolve, may repeat after 4 hours; do not exceed five (5) mg in 24 hours., Disp: 30 tablet, Rfl: 0 .  norethindrone (MICRONOR,CAMILA,ERRIN) 0.35 MG tablet, Take 1 tablet by mouth daily., Disp: , Rfl:  .  pregabalin (LYRICA) 50 MG capsule, Take 1 capsule (50 mg total) by mouth 3 (three) times daily., Disp: 30 capsule, Rfl: 0 .  RABEprazole (ACIPHEX) 20 MG tablet, Take 20 mg by mouth daily., Disp: , Rfl:  .  Saccharomyces boulardii (FLORASTOR PO), Take by mouth daily., Disp: , Rfl:  .  triamterene-hydrochlorothiazide (MAXZIDE-25) 37.5-25 MG tablet, Take 1 tablet by mouth at bedtime., Disp: 90 tablet, Rfl: 3 .  valsartan (DIOVAN) 160 MG tablet, Take 1 tablet (160 mg total) by mouth at bedtime., Disp: 90 tablet, Rfl: 3 .  vitamin B-12 (CYANOCOBALAMIN) 1000 MCG tablet, Take 1,000 mcg by mouth daily., Disp: , Rfl:  .  potassium chloride SA (KLOR-CON M20) 20 MEQ tablet, Take 1 tablet (20 mEq total) by mouth 3 (three) times daily., Disp: 90 tablet, Rfl: 3  EXAM:  Vitals:   06/10/17 1512  BP: 100/86  Pulse: 80  Temp: 98.5 F (36.9 C)  SpO2: 94%    Body mass index is 24.28 kg/m.  GENERAL: vitals reviewed and listed above, alert, oriented, appears well hydrated and in no acute distress  HEENT: atraumatic, conjunttiva clear, no obvious abnormalities on inspection of  external nose and ears  NECK: no obvious masses on inspection  LUNGS: clear to auscultation bilaterally, no wheezes, rales or rhonchi, good air movement  CV: HRRR, no peripheral edema  ABD: soft, NTTP, no CVA TTP  MS: moves all extremities without noticeable abnormality  PSYCH: pleasant and cooperative, no obvious depression or anxiety  ASSESSMENT AND PLAN:  Discussed the following assessment and plan:  Dysuria - Plan: Urine Culture, Hemoglobin A1c, POCT Urinalysis Dipstick (Automated)  Essential hypertension - Plan: Basic metabolic panel, CBC, Lipid panel -Medication refills by my assistant -Labs per orders  Gastroesophageal reflux disease without esophagitis -Continue current medication  Other chronic pain -We will place referral once she lets [place] know who she wants to see -30-day refill of meds in the interim provided  B12 deficiency - Plan: Vitamin B12  She plans to call to schedule appointment with gynecology about the endometriosis, a list of options was provided  -We reviewed the PMH,  PSH, FH, SH, Meds and Allergies. -We provided refills for any medications we will prescribe as needed. -We addressed current concerns per orders and patient instructions. -We have asked for records for pertinent exams, studies, vaccines and notes from previous providers. -We have advised patient to follow up per instructions below.   -Patient advised to return or notify a doctor immediately if symptoms worsen or persist or new concerns arise.  Patient Instructions  BEFORE YOU LEAVE: -labs/urine -follow up: 3-4 months for physical exam  It was nice to meet you today.  Please call around to set up a visit with a gynecologist about the endometriosis.  Let us know who you would like to see for the back and we will send a referral.  We have ordered labs or studies at this visit. It can take up to 1-2 weeks for results and processing. IF results require follow up or explanation, we  will call you with instructions. Clinically stable results will be released to your [facility]. If you have not heard from [place] or cannot find your results in [facility] in 2 weeks please contact our office at [phone].  If you are not yet signed up for [facility], please consider signing up.  WE NOW OFFER   [facility] FAST TRACK!!!  SAME DAY Appointments for ACUTE CARE  Such as: Sprains, Injuries, cuts, abrasions, rashes, muscle pain, joint pain, back pain Colds, flu, sore throats, headache, allergies, cough, fever  Ear pain, sinus and eye infections Abdominal pain, nausea, vomiting, diarrhea, upset stomach Animal/insect bites  3 Easy Ways to Schedule: Walk-In Scheduling Call in scheduling Mychart Sign-up: [email or web address]                 Colin Benton R.

## 2017-06-11 ENCOUNTER — Telehealth: Payer: Self-pay

## 2017-06-11 LAB — CBC
HEMATOCRIT: 34.6 % — AB (ref 36.0–46.0)
Hemoglobin: 11.7 g/dL — ABNORMAL LOW (ref 12.0–15.0)
MCHC: 33.9 g/dL (ref 30.0–36.0)
MCV: 94.1 fl (ref 78.0–100.0)
PLATELETS: 362 10*3/uL (ref 150.0–400.0)
RBC: 3.68 Mil/uL — AB (ref 3.87–5.11)
RDW: 12.6 % (ref 11.5–15.5)
WBC: 7.7 10*3/uL (ref 4.0–10.5)

## 2017-06-11 LAB — BASIC METABOLIC PANEL
BUN: 17 mg/dL (ref 6–23)
CHLORIDE: 101 meq/L (ref 96–112)
CO2: 26 meq/L (ref 19–32)
CREATININE: 0.81 mg/dL (ref 0.40–1.20)
Calcium: 9.4 mg/dL (ref 8.4–10.5)
GFR: 78.66 mL/min (ref >60.00)
Glucose, Bld: 92 mg/dL (ref 70–99)
Potassium: 3.9 mEq/L (ref 3.5–5.1)
SODIUM: 136 meq/L (ref 135–145)

## 2017-06-11 LAB — HEMOGLOBIN A1C: HEMOGLOBIN A1C: 5.7 % (ref 4.6–6.5)

## 2017-06-11 LAB — LIPID PANEL
Cholesterol: 147 mg/dL (ref 0–200)
HDL: 39.8 mg/dL (ref >39.00)
LDL Cholesterol: 94 mg/dL (ref 0–99)
NonHDL: 107.4
TRIGLYCERIDES: 66 mg/dL (ref 0.0–149.0)
Total CHOL/HDL Ratio: 4
VLDL: 13.2 mg/dL (ref 0.0–40.0)

## 2017-06-11 LAB — URINE CULTURE
MICRO NUMBER:: 90072409
RESULT: NO GROWTH
SPECIMEN QUALITY:: ADEQUATE

## 2017-06-11 LAB — VITAMIN B12: Vitamin B-12: 1423 pg/mL — ABNORMAL HIGH (ref 211–911)

## 2017-06-11 NOTE — Telephone Encounter (Signed)
Pt Rx for Lyrica 50 mg  and naratriptan 2.5 mg was phoned in to [facility] outpatient pharmacy on 06/01/2017 at 11.30 am

## 2017-06-15 NOTE — Addendum Note (Signed)
Addended by: Agnes Lawrence on: 06/15/2017 05:24 PM   Modules accepted: Orders

## 2017-06-18 ENCOUNTER — Telehealth: Payer: Self-pay | Admitting: Family Medicine

## 2017-06-18 ENCOUNTER — Encounter (INDEPENDENT_AMBULATORY_CARE_PROVIDER_SITE_OTHER): Payer: Self-pay

## 2017-06-18 DIAGNOSIS — R300 Dysuria: Secondary | ICD-10-CM | POA: Diagnosis not present

## 2017-06-18 LAB — URINALYSIS, ROUTINE W REFLEX MICROSCOPIC
BACTERIA UA: NONE SEEN
Bilirubin Urine: NEGATIVE
Ketones, ur: NEGATIVE
Nitrite: NEGATIVE
SPECIFIC GRAVITY, URINE: 1.01 (ref 1.000–1.030)
Total Protein, Urine: NEGATIVE
Urine Glucose: NEGATIVE
Urobilinogen, UA: 0.2 (ref 0.0–1.0)
pH: 5.5 (ref 5.0–8.0)

## 2017-06-18 NOTE — Addendum Note (Signed)
Addended by: Elmer Picker on: 06/18/2017 08:24 AM   Modules accepted: Orders

## 2017-06-18 NOTE — Telephone Encounter (Signed)
Dr requested last Hemoglobin and Hematocrit from last doctor in [place].   12/31/16-- Hemoglobin- 11.9 Hematocrit- 35.4

## 2017-06-18 NOTE — Addendum Note (Signed)
Addended by: Elmer Picker on: 06/18/2017 08:14 AM   Modules accepted: Orders

## 2017-06-19 LAB — URINE CULTURE
MICRO NUMBER: 90108989
RESULT: NO GROWTH
SPECIMEN QUALITY: ADEQUATE

## 2017-06-21 ENCOUNTER — Encounter: Payer: Self-pay | Admitting: Family Medicine

## 2017-06-21 DIAGNOSIS — D649 Anemia, unspecified: Secondary | ICD-10-CM

## 2017-06-21 HISTORY — DX: Anemia, unspecified: D64.9

## 2017-06-21 NOTE — Telephone Encounter (Signed)
I left a message for the pt to return my call. 

## 2017-06-21 NOTE — Telephone Encounter (Signed)
Let her know thanks. So, did she have work up for this in the past? Or has she always had mild anemia? Just want to ensure not a new issue she wants [place] to evaluate?

## 2017-06-22 ENCOUNTER — Encounter: Payer: Self-pay | Admitting: Family Medicine

## 2017-06-22 NOTE — Addendum Note (Signed)
Addended by: Agnes Lawrence on: 06/22/2017 04:45 PM   Modules accepted: Orders

## 2017-06-22 NOTE — Addendum Note (Signed)
Addended by: Agnes Lawrence on: 06/22/2017 09:18 AM   Modules accepted: Orders

## 2017-06-23 MED FILL — LYRICA 50 MG CAPSULE: 50 | 30 days supply | Qty: 90 | Fill #0

## 2017-06-25 ENCOUNTER — Encounter: Payer: Self-pay | Admitting: Family Medicine

## 2017-06-25 NOTE — Telephone Encounter (Signed)
I called the pt and reviewed all labs and messages from the provider.  I informed the pt I do not see an order for a referral to a hematologist, referrals were placed for a urologist and ortho per her request after talking with a nurse by the name of Stanton Kidney (see results note). Patient stated she would prefer to see a pain specialist instead of an orthopedic doctor and I advised she contact them to cancel the appt and she agreed.  Message sent to Dr Maudie Mercury for approval of the referral.

## 2017-06-25 NOTE — Telephone Encounter (Signed)
I called the pt and she stated she does not recall having a work up for anemia.  Message sent to Dr Maudie Mercury.

## 2017-06-27 NOTE — Telephone Encounter (Signed)
Look stable from prior check she sent [place]. Advise check in 3 months with b12, cbc with diff, folate, anemia panel. Thanks.

## 2017-06-28 ENCOUNTER — Other Ambulatory Visit: Payer: Self-pay | Admitting: *Deleted

## 2017-06-28 DIAGNOSIS — G8929 Other chronic pain: Secondary | ICD-10-CM

## 2017-06-28 DIAGNOSIS — D649 Anemia, unspecified: Secondary | ICD-10-CM

## 2017-06-28 NOTE — Telephone Encounter (Signed)
Patient informed of the message below-see Mychart note.

## 2017-07-04 DIAGNOSIS — B373 Candidiasis of vulva and vagina: Secondary | ICD-10-CM | POA: Diagnosis not present

## 2017-07-04 DIAGNOSIS — J0100 Acute maxillary sinusitis, unspecified: Secondary | ICD-10-CM | POA: Diagnosis not present

## 2017-07-04 DIAGNOSIS — R300 Dysuria: Secondary | ICD-10-CM | POA: Diagnosis not present

## 2017-07-04 DIAGNOSIS — N3090 Cystitis, unspecified without hematuria: Secondary | ICD-10-CM | POA: Diagnosis not present

## 2017-07-05 MED FILL — VALSARTAN 160 MG TABS: 160 | 90 days supply | Qty: 90 | Fill #0

## 2017-07-08 MED FILL — AMLODIPINE BESYLATE 2.5 MG: 2.5 | 90 days supply | Qty: 90 | Fill #0

## 2017-07-08 MED FILL — RABEPRAZOLE SOD DR 20 MG TA: 20 | 90 days supply | Qty: 90 | Fill #1

## 2017-07-21 ENCOUNTER — Encounter: Payer: Self-pay | Admitting: Family Medicine

## 2017-07-29 NOTE — Telephone Encounter (Signed)
-----   Message from Lucretia Kern, DO sent at 07/29/2017  3:17 PM EST ----- Regarding: FW: referral Please contact patient and let her know that the pain clinic is requesting her records from her prior pain specialist.Please give her the information to send these records to the new pain clinic.  Please let Lattie Haw know that we do not have these records, but that we will request that the patient provide them to their clinic.  ----- Message ----- From: Quay Burow Sent: 07/29/2017   2:21 PM To: Lucretia Kern, DO, Agnes Lawrence, CMA Subject: referral                                       Hey there, Received referral for this pt & MD has stated there's not enough info in Epic to review for pt treatment. It would be helpful to get previous records from pain clinic in [place]. If so, please fax those to [place] as [facility], attn: Vilinda Blanks [phone].  Thank You Kathyrn Lass  Referral Coordinator [facility] [phone] [email or web address]

## 2017-07-29 NOTE — Telephone Encounter (Signed)
I left a detailed message with the information below at the pts cell number. 

## 2017-07-30 DIAGNOSIS — N3020 Other chronic cystitis without hematuria: Secondary | ICD-10-CM | POA: Diagnosis not present

## 2017-07-30 DIAGNOSIS — R310 Gross hematuria: Secondary | ICD-10-CM | POA: Diagnosis not present

## 2017-08-02 MED FILL — TRIAMTERENE/HCTZ 37.5/25 CP: 37.5-25 | 90 days supply | Qty: 90 | Fill #1

## 2017-08-02 MED FILL — METAXALONE 800 MG TABLET: 800 | 90 days supply | Qty: 90 | Fill #1

## 2017-08-04 ENCOUNTER — Encounter: Payer: Self-pay | Admitting: Family Medicine

## 2017-08-07 ENCOUNTER — Encounter: Payer: Self-pay | Admitting: Family Medicine

## 2017-08-10 NOTE — Telephone Encounter (Signed)
Called patient and left message to return call. Also sent MyChart message to patient w/ information from Dr. Maudie Mercury.

## 2017-08-10 NOTE — Telephone Encounter (Signed)
I called the pain clinic and spoke with Lattie Haw.  She stated the physician reviewed the notes in Epic which stated the pt was seen by another pain management physician and would like to the notes to review.  I advised Lattie Haw the previous note from the pt stated she was not seen by a physician in [place] and I asked that she call their office to let them know this. I called the pt and informed her of the office awaiting notes from the previous physician that treated her pain and she stated it was not a pain clinic.  Patient stated her previous PCP, which was a DO is the provider that treated her and I advised she call them to have records sent to [facility] pain clinic and the phone and fax number was given.  Patient stated she is a bit frustrated with the problems she has had here reaching someone about different referrals and wanted to know if she should find another doctor that can treat her pain and questioned why Dr Maudie Mercury cannot do this like her other doctor.  I advised the pt Dr Maudie Mercury recommended the referral and a response has been given to each message received whether it be a phone call or Mychart message.  Message sent to Dr Maudie Mercury for a refill as the pt stated she is almost out of Lyrica and she is concerned about having seizures.

## 2017-08-16 ENCOUNTER — Encounter: Payer: Self-pay | Admitting: Family Medicine

## 2017-08-16 MED ORDER — PREGABALIN 50 MG PO CAPS: 50.0000 mg | ORAL_CAPSULE | Freq: Three times a day (TID) | ORAL | 0 refills | Status: DC

## 2017-08-16 MED FILL — LYRICA 50 MG CAPSULE: 50 | 30 days supply | Qty: 90 | Fill #0

## 2017-08-18 ENCOUNTER — Ambulatory Visit (INDEPENDENT_AMBULATORY_CARE_PROVIDER_SITE_OTHER): Payer: Self-pay | Admitting: Emergency Medicine

## 2017-08-18 VITALS — BP 95/70 | HR 85 | Temp 99.0°F | Resp 16 | Wt 155.0 lb

## 2017-08-18 DIAGNOSIS — R300 Dysuria: Secondary | ICD-10-CM

## 2017-08-18 DIAGNOSIS — N3001 Acute cystitis with hematuria: Secondary | ICD-10-CM

## 2017-08-18 LAB — POCT URINALYSIS DIPSTICK
BILIRUBIN UA: NEGATIVE
GLUCOSE UA: NEGATIVE
Ketones, UA: NEGATIVE
LEUKOCYTES UA: NEGATIVE
Nitrite, UA: NEGATIVE
Protein, UA: NEGATIVE
Spec Grav, UA: 1.015 (ref 1.010–1.025)
Urobilinogen, UA: 0.2 E.U./dL
pH, UA: 5 (ref 5.0–8.0)

## 2017-08-18 MED ORDER — CEPHALEXIN 500 MG PO CAPS
500.0000 mg | ORAL_CAPSULE | Freq: Four times a day (QID) | ORAL | 0 refills | Status: DC
Start: 2017-08-18 — End: 2017-12-28

## 2017-08-18 MED ORDER — PHENAZOPYRIDINE HCL 100 MG PO TABS: 100.0000 mg | ORAL_TABLET | Freq: Three times a day (TID) | ORAL | 0 refills | Status: DC | PRN

## 2017-08-18 MED ORDER — FLUCONAZOLE 200 MG PO TABS: ORAL_TABLET | ORAL | 0 refills | Status: DC

## 2017-08-18 MED FILL — CEPHALEXIN 500 MG CAPSULE: 500 | 10 days supply | Qty: 40 | Fill #0

## 2017-08-18 MED FILL — FLUCONAZOLE 200 MG TABLET: 200 | 3 days supply | Qty: 2 | Fill #0

## 2017-08-18 NOTE — Patient Instructions (Signed)

## 2017-08-18 NOTE — Progress Notes (Signed)
S: Denise Campos is a 52 y.o. female who presents for evaluation of dysuria, frequency, urgency, and hematuria. Has past history of kidney stones and frequent UTIs, is under care of [facility] and has CT schedule in April related to UTIs. Current symptoms began 3 days a go. Denies N/V/D, not currently on her cycle, not taking Azo or pyridium. Reports pain is consistent with past infections.  Review of Systems  Constitutional: Negative for chills, fever and malaise/fatigue.  Gastrointestinal: Negative for abdominal pain, constipation, nausea and vomiting.  Genitourinary: Positive for dysuria, flank pain, frequency, hematuria and urgency.  Neurological: Negative for dizziness.    O:  Vitals:   08/18/17 0830  BP: 95/70  Pulse: 85  Resp: 16  Temp: 99 F (37.2 C)  SpO2: 100%   Physical Exam  Constitutional: She appears well-developed and well-nourished. No distress.  Abdominal: Soft. There is no tenderness. There is CVA tenderness (Right).  Neurological: She is alert.  Skin: Skin is warm. Capillary refill takes less than 2 seconds.  Nursing note and vitals reviewed.   Results for orders placed or performed in visit on 08/18/17  POCT Urinalysis Dipstick  Result Value Ref Range   Color, UA clear    Clarity, UA clear    Glucose, UA negative    Bilirubin, UA negative    Ketones, UA negative    Spec Grav, UA 1.015 1.010 - 1.025   Blood, UA large    pH, UA 5.0 5.0 - 8.0   Protein, UA negative    Urobilinogen, UA 0.2 0.2 or 1.0 E.U./dL   Nitrite, UA negative    Leukocytes, UA Negative Negative   Appearance clear    Odor none     A:  1. Dysuria   2. Acute cystitis with hematuria     P:  Flank pain with hematuria, renal stone on the list of differentials but patient reports symptoms not consistent with prior kidney stones.Has appointment with urology in April for CT scan, will cover for complicated UTI, follow up with urology. If symptoms persist past 5 days recommend  follow up with PCP, GYN, Urology, or [facility] for culture and sensitivity.

## 2017-08-19 MED FILL — POTASSIUM CL ER 20 MEQ TABL: 20 | 90 days supply | Qty: 270 | Fill #1

## 2017-08-24 ENCOUNTER — Telehealth: Payer: Self-pay

## 2017-09-09 ENCOUNTER — Encounter: Payer: 59 | Admitting: Family Medicine

## 2017-09-16 ENCOUNTER — Encounter: Payer: Self-pay | Admitting: Family Medicine

## 2017-09-20 MED FILL — NORETHINDRONE 0.35 MG TAB: 0.35 | 84 days supply | Qty: 84 | Fill #2

## 2017-09-23 ENCOUNTER — Ambulatory Visit (INDEPENDENT_AMBULATORY_CARE_PROVIDER_SITE_OTHER): Payer: 59 | Admitting: Family Medicine

## 2017-09-23 ENCOUNTER — Encounter: Payer: Self-pay | Admitting: Family Medicine

## 2017-09-23 VITALS — BP 100/80 | HR 90 | Temp 98.3°F | Ht 66.25 in | Wt 159.6 lb

## 2017-09-23 DIAGNOSIS — I10 Essential (primary) hypertension: Secondary | ICD-10-CM | POA: Diagnosis not present

## 2017-09-23 DIAGNOSIS — Z0000 Encounter for general adult medical examination without abnormal findings: Secondary | ICD-10-CM

## 2017-09-23 DIAGNOSIS — D649 Anemia, unspecified: Secondary | ICD-10-CM | POA: Diagnosis not present

## 2017-09-23 DIAGNOSIS — K219 Gastro-esophageal reflux disease without esophagitis: Secondary | ICD-10-CM

## 2017-09-23 DIAGNOSIS — E538 Deficiency of other specified B group vitamins: Secondary | ICD-10-CM

## 2017-09-23 DIAGNOSIS — G8929 Other chronic pain: Secondary | ICD-10-CM | POA: Diagnosis not present

## 2017-09-23 LAB — CBC
HCT: 30.8 % — ABNORMAL LOW (ref 36.0–46.0)
Hemoglobin: 10.6 g/dL — ABNORMAL LOW (ref 12.0–15.0)
MCHC: 34.4 g/dL (ref 30.0–36.0)
MCV: 92.7 fl (ref 78.0–100.0)
Platelets: 331 10*3/uL (ref 150.0–400.0)
RBC: 3.32 Mil/uL — ABNORMAL LOW (ref 3.87–5.11)
RDW: 13.3 % (ref 11.5–15.5)
WBC: 8.1 10*3/uL (ref 4.0–10.5)

## 2017-09-23 LAB — BASIC METABOLIC PANEL
BUN: 11 mg/dL (ref 6–23)
CALCIUM: 9.6 mg/dL (ref 8.4–10.5)
CO2: 29 meq/L (ref 19–32)
Chloride: 101 mEq/L (ref 96–112)
Creatinine, Ser: 0.75 mg/dL (ref 0.40–1.20)
GFR: 85.88 mL/min (ref >60.00)
GLUCOSE: 89 mg/dL (ref 70–99)
Potassium: 3.8 mEq/L (ref 3.5–5.1)
Sodium: 136 mEq/L (ref 135–145)

## 2017-09-23 MED ORDER — EPINEPHRINE 0.3 MG/0.3ML IJ SOAJ: 0.3000 mg | Freq: Once | INTRAMUSCULAR | 0 refills | Status: AC

## 2017-09-23 MED ORDER — CYCLOBENZAPRINE HCL 10 MG PO TABS: 10.0000 mg | ORAL_TABLET | Freq: Three times a day (TID) | ORAL | 0 refills | Status: DC | PRN

## 2017-09-23 MED ORDER — VALSARTAN 160 MG PO TABS: 160.0000 mg | ORAL_TABLET | Freq: Every day | ORAL | 0 refills | Status: DC

## 2017-09-23 MED ORDER — PREGABALIN 50 MG PO CAPS: 50.0000 mg | ORAL_CAPSULE | Freq: Three times a day (TID) | ORAL | 0 refills | Status: DC

## 2017-09-23 MED ORDER — IBUPROFEN 800 MG PO TABS: 800.0000 mg | ORAL_TABLET | Freq: Three times a day (TID) | ORAL | 0 refills | Status: DC | PRN

## 2017-09-23 MED ORDER — TRIAMTERENE-HCTZ 37.5-25 MG PO TABS: 1.0000 | ORAL_TABLET | Freq: Every day | ORAL | 0 refills | Status: DC

## 2017-09-23 MED ORDER — RABEPRAZOLE SODIUM 20 MG PO TBEC: 20.0000 mg | DELAYED_RELEASE_TABLET | Freq: Every day | ORAL | 0 refills | Status: DC

## 2017-09-23 MED ORDER — POTASSIUM CHLORIDE CRYS ER 20 MEQ PO TBCR: 20.0000 meq | EXTENDED_RELEASE_TABLET | Freq: Three times a day (TID) | ORAL | 0 refills | Status: DC

## 2017-09-23 MED ORDER — AMLODIPINE BESYLATE 2.5 MG PO TABS: 2.5000 mg | ORAL_TABLET | Freq: Every day | ORAL | 0 refills | Status: DC

## 2017-09-23 MED FILL — VALSARTAN 160 MG TABS: 160 | 90 days supply | Qty: 90 | Fill #0

## 2017-09-23 MED FILL — RABEPRAZOLE SOD DR 20 MG TA: 20 | 90 days supply | Qty: 90 | Fill #0

## 2017-09-23 MED FILL — AMLODIPINE BESYLATE 2.5 MG: 2.5 | 90 days supply | Qty: 90 | Fill #0

## 2017-09-23 MED FILL — IBUPROFEN 800 MG TAB: 800 | 90 days supply | Qty: 270 | Fill #0

## 2017-09-23 MED FILL — CYCLOBENZAPRINE 10 MG TAB: 10 | 10 days supply | Qty: 30 | Fill #0

## 2017-09-23 MED FILL — LYRICA 50 MG CAPSULE: 50 | 90 days supply | Qty: 270 | Fill #0

## 2017-09-23 NOTE — Addendum Note (Signed)
Addended by: Agnes Lawrence on: 09/23/2017 01:56 PM   Modules accepted: Orders

## 2017-09-23 NOTE — Progress Notes (Signed)
HPI:  Using dictation device. Unfortunately this device frequently misinterprets words/phrases.  Here for CPE: Due for labs, mammo, colon? -Concerns and/or follow up today:   Chronic medical problems summarized below were reviewed for changes.  Reports is seeing [facility] about the dysuria and recurrent UTIs.  She will be having a CT scan and also cystoscopy soon. Reports she gave up aspartame and her pain has improved.  She does wish to continue the Lyrica and the anti-inflammatory as these help her to continue to work.  She has been taking these for a long time and has tolerated them well without significant side effects.  She also uses Flexeril sometimes and Skelaxin sometimes as needed.  She has not used either in several weeks.  She requests a refill o she has not been able to get in with the pain clinic, so she requests and she is doing well if we can refill these medications.  She also wants to consider other treatments such as massage therapy.  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 She also requests refills on her blood pressure medications.  Reported  Mild normocytic anemia: -labs 05/2017 when establish, pt thought this was chronic per lab notes  Recurrent dysuria/UTIs: -seeing [facility]  B12 deficiency: -Takes oral B12  Hypertension: -Medications include aspirin, valsartan, traim-hctz (from prior pcp) and Norvasc, potassium  Allergic rhinitis with recurrent strep: -Reports she saw several ear nose and throat doctors in the past, Zyrtec has decreased her strep recurrence  GERD: -History of gastric ulcer remotely -Medications include AcipHex, request refill -Denies any history of dysphasia, stricture, Barrett's or bleeding  Generalized anxiety disorder/insomnia: -Use Xanax rarely in the past, not using currently  Endometriosis: -On norethindrone for this to reduce periods -Plans to establish with gynecology  Migraines: -Uses triptan's  regularly, these were related to periods and on the normethadone she has much fewer migraines  Chronic back pain: -Reports history of degenerative disc disease and several back surgeries -was seeing specialist prior to move to [place], had several injections in the past -Medications include Lyrica, Flexeril, Skelaxin sometimes, Mobic (started by prior physician) -reports the Mobic is the only way that she can work, reports has tolerated these medicines well   -Diet: variety of foods, balance and well -Exercise: no regular exercise -Taking folic acid, vitamin D or calcium: no -Diabetes and Dyslipidemia Screening: Not fasting, labs were done in January -Vaccines: see vaccine section EPIC -pap history: She plans to see a gynecologist and will be calling, reports she is due this summer -FDLMP: see nursing notes -sexual activity: yes, female partner, no new partners -wants STI testing (Hep C if born [birth year]): no -FH breast, colon or ovar she has never done this, she agrees to call and schedule, she does do self breast exams, plans to see a gynecologist [place] ca: see FH Last mammogram:  Last colon cancer screening: She has not done colon cancer screening, she is interested in a Cologuard test, she wants to check with her insurance first Breast Ca Risk Assessment: see family history and pt history DEXA (>/= 48): N/A  -Alcohol, Tobacco, drug use: see social history  Review of Systems - no fevers, unintentional weight loss, vision loss, hearing loss, chest pain, sob, hemoptysis, melena, hematochezia, hematuria, genital discharge, changing or concerning skin lesions, bleeding, bruising, loc, thoughts of self harm or SI  Past Medical History:  Diagnosis Date  . Anemia 06/21/2017   -patient reports chronic, normal for her  . Cancer (  HCC)   . GERD (gastroesophageal reflux disease)   . History of stomach ulcers   . Hypertension   . Migraines   . UTI (urinary tract infection)     Past Surgical  History:  Procedure Laterality Date  . APPENDECTOMY    . TONSILLECTOMY AND ADENOIDECTOMY      History reviewed. No pertinent family history.  Social History   Socioeconomic History  . Marital status: Divorced    Spouse name: Not on file  . Number of children: Not on file  . Years of education: Not on file  . Highest education level: Not on file  Occupational History  . Not on file  Social Needs  . Financial resource strain: Not on file  . Food insecurity:    Worry: Not on file    Inability: Not on file  . Transportation needs:    Medical: Not on file    Non-medical: Not on file  Tobacco Use  . Smoking status: Never Smoker  . Smokeless tobacco: Never Used  Substance and Sexual Activity  . Alcohol use: No    Frequency: Never  . Drug use: No  . Sexual activity: Never  Lifestyle  . Physical activity:    Days per week: Not on file    Minutes per session: Not on file  . Stress: Not on file  Relationships  . Social connections:    Talks on phone: Not on file    Gets together: Not on file    Attends religious service: Not on file    Active member of club or organization: Not on file    Attends meetings of clubs or organizations: Not on file    Relationship status: Not on file  Other Topics Concern  . Not on file  Social History Narrative   Work or School: PICU nurse      Home Situation:      Spiritual Beliefs:      Lifestyle:     Current Outpatient Medications:  .  amLODipine (NORVASC) 2.5 MG tablet, Take 1 tablet (2.5 mg total) by mouth daily., Disp: 90 tablet, Rfl: 3 .  aspirin 81 MG tablet, Take 81 mg by mouth daily., Disp: , Rfl:  .  cephALEXin (KEFLEX) 500 MG capsule, Take 1 capsule (500 mg total) by mouth 4 (four) times daily., Disp: 40 capsule, Rfl: 0 .  cetirizine (ZYRTEC) 10 MG tablet, Take 10 mg by mouth daily., Disp: , Rfl:  .  Cholecalciferol (VITAMIN D3) 1000 units CAPS, Take by mouth., Disp: , Rfl:  .  cyclobenzaprine (FLEXERIL) 10 MG tablet,  Take 1 tablet (10 mg total) by mouth 3 (three) times daily as needed for muscle spasms., Disp: 30 tablet, Rfl: 0 .  EPINEPHrine (EPIPEN IJ), Inject as directed., Disp: , Rfl:  .  fluconazole (DIFLUCAN) 200 MG tablet, Take one tablet today, wait 3 days, take the second tablet, Disp: 2 tablet, Rfl: 0 .  ibuprofen (ADVIL,MOTRIN) 800 MG tablet, Take 1 tablet (800 mg total) by mouth every 8 (eight) hours as needed., Disp: 30 tablet, Rfl: 0 .  Magnesium 500 MG TABS, Take 500 mg by mouth daily., Disp: , Rfl:  .  metaxalone (SKELAXIN) 800 MG tablet, Take 800 mg by mouth 3 (three) times daily., Disp: , Rfl:  .  naratriptan (AMERGE) 2.5 MG tablet, Take 1 tablet (2.5 mg total) by mouth as needed for migraine. Take one (1) tablet at onset of headache; if returns or does not resolve, may repeat after  4 hours; do not exceed five (5) mg in 24 hours., Disp: 30 tablet, Rfl: 0 .  norethindrone (MICRONOR,CAMILA,ERRIN) 0.35 MG tablet, Take 1 tablet by mouth daily., Disp: , Rfl:  .  phenazopyridine (PYRIDIUM) 100 MG tablet, Take 1 tablet (100 mg total) by mouth 3 (three) times daily as needed for pain., Disp: 10 tablet, Rfl: 0 .  potassium chloride SA (KLOR-CON M20) 20 MEQ tablet, Take 1 tablet (20 mEq total) by mouth 3 (three) times daily., Disp: 90 tablet, Rfl: 3 .  pregabalin (LYRICA) 50 MG capsule, Take 1 capsule (50 mg total) by mouth 3 (three) times daily., Disp: 90 capsule, Rfl: 0 .  RABEprazole (ACIPHEX) 20 MG tablet, Take 20 mg by mouth daily., Disp: , Rfl:  .  Saccharomyces boulardii (FLORASTOR PO), Take by mouth daily., Disp: , Rfl:  .  triamterene-hydrochlorothiazide (MAXZIDE-25) 37.5-25 MG tablet, Take 1 tablet by mouth at bedtime., Disp: 90 tablet, Rfl: 3 .  valsartan (DIOVAN) 160 MG tablet, Take 1 tablet (160 mg total) by mouth at bedtime., Disp: 90 tablet, Rfl: 3 .  vitamin B-12 (CYANOCOBALAMIN) 1000 MCG tablet, Take 1,000 mcg by mouth daily., Disp: , Rfl:   EXAM:  Vitals:   09/23/17 1304  BP: 100/80   Pulse: 90  Temp: 98.3 F (36.8 C)    GENERAL: vitals reviewed and listed below, alert, oriented, appears well hydrated and in no acute distress  HEENT: head atraumatic, PERRLA, normal appearance of eyes, ears, nose and mouth. moist mucus membranes.  NECK: supple, no masses or lymphadenopathy  LUNGS: clear to auscultation bilaterally, no rales, rhonchi or wheeze  CV: HRRR, no peripheral edema or cyanosis, normal pedal pulses  ABDOMEN: bowel sounds normal, soft, non tender to palpation, no masses, no rebound or guarding  GU/BREAST: Plans to see a gynecologist  SKIN: no rash or abnormal lesions on arms or back, offered full skin check  MS: normal gait, moves all extremities normally  NEURO: normal gait, speech and thought processing grossly intact, muscle tone grossly intact throughout  PSYCH: normal affect, pleasant and cooperative  ASSESSMENT AND PLAN:  Discussed the following assessment and plan:  PREVENTIVE EXAM: -Discussed and advised all [place] preventive services health task force level A and B recommendations for age, sex and risks. -Recommended a healthy low sugar diet and regular exercise She plans to see gynecology for GYN-exam and breast exam She plans to schedule her mammogram She wants to check on the cost of Cologuard and agrees to call us back or email [place] to let us know which form of colon cancer screening to order  2. Essential hypertension Labs per orders, also will check on the anemia  3. Gastroesophageal reflux disease without esophagitis Stable  4. Other chronic pain She has asked [place] to take over her medications for chronic pain We have discussed risk and interactions If anemia is worsening, we may need to consider a GI evaluation and consider lowering or stopping the NSAID, however she feels that this is the only thing keeping her functioning Refills provided Follow-up in 3 months Did advise of pain escalating would need to see an orthopedic  specialist  5. B12 deficiency Checked with last set of labs, continue supplementation   Patient advised to return to clinic immediately if symptoms worsen or persist or new concerns.  Patient Instructions  BEFORE YOU LEAVE: -refills per her request, 90 days -cologuard brochure -labs -follow up: 3 months  Please complete colon cancer screening. Please let us know if you want  to do the cologuard test or colonoscopy after checking with your insurance.  Schedule your mammogram  Set up your gynecology exam  Let us know if your wish to try osteopathic treatments for the chronic pain and your back, please contact your insurance to ensure coverage.   Preventive Care 40-64 Years, Female Preventive care refers to lifestyle choices and visits with your health care provider that can promote health and wellness. What does preventive care include?  A yearly physical exam. This is also called an annual well check.  Dental exams once or twice a year.  Routine eye exams. Ask your health care provider how often you should have your eyes checked.  Personal lifestyle choices, including: ? Daily care of your teeth and gums. ? Regular physical activity. ? Eating a healthy diet. ? Avoiding tobacco and drug use. ? Limiting alcohol use. ? Practicing safe sex. ? Taking low-dose aspirin daily starting at age 107. ? Taking vitamin and mineral supplements as recommended by your health care provider. What happens during an annual well check? The services and screenings done by your health care provider during your annual well check will depend on your age, overall health, lifestyle risk factors, and family history of disease. Counseling Your health care provider may ask you questions about your:  Alcohol use.  Tobacco use.  Drug use.  Emotional well-being.  Home and relationship well-being.  Sexual activity.  Eating habits.  Work and work Statistician.  Method of birth  control.  Menstrual cycle.  Pregnancy history.  Screening You may have the following tests or measurements:  Height, weight, and BMI.  Blood pressure.  Lipid and cholesterol levels. These may be checked every 5 years, or more frequently if you are over 65 years old.  Skin check.  Lung cancer screening. You may have this screening every year starting at age 70 if you have a 30-pack-year history of smoking and currently smoke or have quit within the past 15 years.  Fecal occult blood test (FOBT) of the stool. You may have this test every year starting at age 12.  Flexible sigmoidoscopy or colonoscopy. You may have a sigmoidoscopy every 5 years or a colonoscopy every 10 years starting at age 76.  Hepatitis C blood test.  Hepatitis B blood test.  Sexually transmitted disease (STD) testing.  Diabetes screening. This is done by checking your blood sugar (glucose) after you have not eaten for a while (fasting). You may have this done every 1-3 years.  Mammogram. This may be done every 1-2 years. Talk to your health care provider about when you should start having regular mammograms. This may depend on whether you have a family history of breast cancer.  BRCA-related cancer screening. This may be done if you have a family history of breast, ovarian, tubal, or peritoneal cancers.  Pelvic exam and Pap test. This may be done every 3 years starting at age 75. Starting at age 47, this may be done every 5 years if you have a Pap test in combination with an HPV test.  Bone density scan. This is done to screen for osteoporosis. You may have this scan if you are at high risk for osteoporosis.  Discuss your test results, treatment options, and if necessary, the need for more tests with your health care provider. Vaccines Your health care provider may recommend certain vaccines, such as:  Influenza vaccine. This is recommended every year.  Tetanus, diphtheria, and acellular pertussis (Tdap,  Td) vaccine. You may need  a Td booster every 10 years.  Varicella vaccine. You may need this if you have not been vaccinated.  Zoster vaccine. You may need this after age 18.  Measles, mumps, and rubella (MMR) vaccine. You may need at least one dose of MMR if you were born in 1957 or later. You may also need a second dose.  Pneumococcal 13-valent conjugate (PCV13) vaccine. You may need this if you have certain conditions and were not previously vaccinated.  Pneumococcal polysaccharide (PPSV23) vaccine. You may need one or two doses if you smoke cigarettes or if you have certain conditions.  Meningococcal vaccine. You may need this if you have certain conditions.  Hepatitis A vaccine. You may need this if you have certain conditions or if you travel or work in places where you may be exposed to hepatitis A.  Hepatitis B vaccine. You may need this if you have certain conditions or if you travel or work in places where you may be exposed to hepatitis B.  Haemophilus influenzae type b (Hib) vaccine. You may need this if you have certain conditions.  Talk to your health care provider about which screenings and vaccines you need and how often you need them. This information is not intended to replace advice given to you by your health care provider. Make sure you discuss any questions you have with your health care provider. Document Released: 06/07/2015 Document Revised: 01/29/2016 Document Reviewed: 03/12/2015 Elsevier Interactive Patient Education  2018 [employer].    No follow-ups on file.  Lucretia Kern, DO

## 2017-09-23 NOTE — Patient Instructions (Signed)
BEFORE YOU LEAVE: -refills per her request, 90 days -cologuard brochure -labs -follow up: 3 months  Please complete colon cancer screening. Please let us know if you want to do the cologuard test or colonoscopy after checking with your insurance.  Schedule your mammogram  Set up your gynecology exam  Let us know if your wish to try osteopathic treatments for the chronic pain and your back, please contact your insurance to ensure coverage.   Preventive Care 40-64 Years, Female Preventive care refers to lifestyle choices and visits with your health care provider that can promote health and wellness. What does preventive care include?  A yearly physical exam. This is also called an annual well check.  Dental exams once or twice a year.  Routine eye exams. Ask your health care provider how often you should have your eyes checked.  Personal lifestyle choices, including: ? Daily care of your teeth and gums. ? Regular physical activity. ? Eating a healthy diet. ? Avoiding tobacco and drug use. ? Limiting alcohol use. ? Practicing safe sex. ? Taking low-dose aspirin daily starting at age 83. ? Taking vitamin and mineral supplements as recommended by your health care provider. What happens during an annual well check? The services and screenings done by your health care provider during your annual well check will depend on your age, overall health, lifestyle risk factors, and family history of disease. Counseling Your health care provider may ask you questions about your:  Alcohol use.  Tobacco use.  Drug use.  Emotional well-being.  Home and relationship well-being.  Sexual activity.  Eating habits.  Work and work Statistician.  Method of birth control.  Menstrual cycle.  Pregnancy history.  Screening You may have the following tests or measurements:  Height, weight, and BMI.  Blood pressure.  Lipid and cholesterol levels. These may be checked every 5 years,  or more frequently if you are over 40 years old.  Skin check.  Lung cancer screening. You may have this screening every year starting at age 36 if you have a 30-pack-year history of smoking and currently smoke or have quit within the past 15 years.  Fecal occult blood test (FOBT) of the stool. You may have this test every year starting at age 70.  Flexible sigmoidoscopy or colonoscopy. You may have a sigmoidoscopy every 5 years or a colonoscopy every 10 years starting at age 71.  Hepatitis C blood test.  Hepatitis B blood test.  Sexually transmitted disease (STD) testing.  Diabetes screening. This is done by checking your blood sugar (glucose) after you have not eaten for a while (fasting). You may have this done every 1-3 years.  Mammogram. This may be done every 1-2 years. Talk to your health care provider about when you should start having regular mammograms. This may depend on whether you have a family history of breast cancer.  BRCA-related cancer screening. This may be done if you have a family history of breast, ovarian, tubal, or peritoneal cancers.  Pelvic exam and Pap test. This may be done every 3 years starting at age 50. Starting at age 43, this may be done every 5 years if you have a Pap test in combination with an HPV test.  Bone density scan. This is done to screen for osteoporosis. You may have this scan if you are at high risk for osteoporosis.  Discuss your test results, treatment options, and if necessary, the need for more tests with your health care provider. Vaccines Your health  care provider may recommend certain vaccines, such as:  Influenza vaccine. This is recommended every year.  Tetanus, diphtheria, and acellular pertussis (Tdap, Td) vaccine. You may need a Td booster every 10 years.  Varicella vaccine. You may need this if you have not been vaccinated.  Zoster vaccine. You may need this after age 14.  Measles, mumps, and rubella (MMR) vaccine. You  may need at least one dose of MMR if you were born in 1957 or later. You may also need a second dose.  Pneumococcal 13-valent conjugate (PCV13) vaccine. You may need this if you have certain conditions and were not previously vaccinated.  Pneumococcal polysaccharide (PPSV23) vaccine. You may need one or two doses if you smoke cigarettes or if you have certain conditions.  Meningococcal vaccine. You may need this if you have certain conditions.  Hepatitis A vaccine. You may need this if you have certain conditions or if you travel or work in places where you may be exposed to hepatitis A.  Hepatitis B vaccine. You may need this if you have certain conditions or if you travel or work in places where you may be exposed to hepatitis B.  Haemophilus influenzae type b (Hib) vaccine. You may need this if you have certain conditions.  Talk to your health care provider about which screenings and vaccines you need and how often you need them. This information is not intended to replace advice given to you by your health care provider. Make sure you discuss any questions you have with your health care provider. Document Released: 06/07/2015 Document Revised: 01/29/2016 Document Reviewed: 03/12/2015 Elsevier Interactive Patient Education  [employer].

## 2017-09-24 ENCOUNTER — Other Ambulatory Visit: Payer: Self-pay | Admitting: Family Medicine

## 2017-09-27 NOTE — Addendum Note (Signed)
Addended by: Agnes Lawrence on: 09/27/2017 05:32 PM   Modules accepted: Orders

## 2017-09-29 ENCOUNTER — Other Ambulatory Visit (INDEPENDENT_AMBULATORY_CARE_PROVIDER_SITE_OTHER): Payer: 59

## 2017-09-29 DIAGNOSIS — D649 Anemia, unspecified: Secondary | ICD-10-CM | POA: Diagnosis not present

## 2017-09-29 LAB — VITAMIN B12: VITAMIN B 12: 1085 pg/mL — AB (ref 211–911)

## 2017-09-29 LAB — FOLATE

## 2017-09-29 NOTE — Addendum Note (Signed)
Addended by: Elmer Picker on: 09/29/2017 08:26 AM   Modules accepted: Orders

## 2017-10-01 LAB — SPECIMEN STATUS REPORT

## 2017-10-02 LAB — ANEMIA PANEL
Ferritin: 94 ng/mL (ref 15–150)
HEMATOCRIT: 32 % — AB (ref 34.0–46.6)
IRON SATURATION: 12 % — AB (ref 15–55)
IRON: 35 ug/dL (ref 27–159)
Retic Ct Pct: 1.5 % (ref 0.6–2.6)
TIBC: 282 ug/dL (ref 250–450)
UIBC: 247 ug/dL (ref 131–425)
VITAMIN B 12: 1515 pg/mL — AB (ref 232–1245)

## 2017-10-02 LAB — SPECIMEN STATUS REPORT

## 2017-10-04 ENCOUNTER — Encounter: Payer: Self-pay | Admitting: Family Medicine

## 2017-10-17 ENCOUNTER — Encounter: Payer: Self-pay | Admitting: Family Medicine

## 2017-11-02 MED FILL — EPINEPHRINE 0.3 MG AUTO-INJ: 0.3 | 30 days supply | Qty: 2 | Fill #0

## 2017-11-02 MED FILL — TRIAMTERENE-HCTZ 37.5-25 MG: 37.5-25 | 90 days supply | Qty: 90 | Fill #0

## 2017-11-05 ENCOUNTER — Ambulatory Visit (INDEPENDENT_AMBULATORY_CARE_PROVIDER_SITE_OTHER): Payer: Self-pay | Admitting: Nurse Practitioner

## 2017-11-05 ENCOUNTER — Encounter: Payer: Self-pay | Admitting: Nurse Practitioner

## 2017-11-05 VITALS — BP 90/64 | HR 87 | Temp 98.4°F | Wt 155.2 lb

## 2017-11-05 DIAGNOSIS — J019 Acute sinusitis, unspecified: Secondary | ICD-10-CM

## 2017-11-05 MED ORDER — FLUTICASONE PROPIONATE 50 MCG/ACT NA SUSP: 2.0000 | Freq: Every day | NASAL | 0 refills | Status: DC

## 2017-11-05 MED ORDER — FLUCONAZOLE 150 MG PO TABS: 150.0000 mg | ORAL_TABLET | Freq: Once | ORAL | 0 refills | Status: AC

## 2017-11-05 MED ORDER — AMOXICILLIN-POT CLAVULANATE 875-125 MG PO TABS: 1.0000 | ORAL_TABLET | Freq: Two times a day (BID) | ORAL | 0 refills | Status: AC

## 2017-11-05 MED ORDER — BENZONATATE 200 MG PO CAPS: 200.0000 mg | ORAL_CAPSULE | Freq: Two times a day (BID) | ORAL | 0 refills | Status: AC | PRN

## 2017-11-05 MED FILL — BENZONATATE 200 MG CAPSULE: 200 | 10 days supply | Qty: 20 | Fill #0

## 2017-11-05 MED FILL — FLUCONAZOLE 150 MG TABS: 150 | 3 days supply | Qty: 3 | Fill #0

## 2017-11-05 MED FILL — AMOX-CLAV 875-125 MG TABLET: 875-125 | 10 days supply | Qty: 20 | Fill #0

## 2017-11-05 MED FILL — FLUTICASONE PROP 50 MCG SPR: 50 | 30 days supply | Qty: 16 | Fill #0

## 2017-11-05 NOTE — Progress Notes (Signed)
Subjective:  Denise Campos is a 52 y.o. female who presents for evaluation of possible sinusitis.  Symptoms include bilateral ear pressure/pain, nasal congestion, non productive cough, post nasal drip, purulent nasal drainage, shortness of breath, sinus pressure, sinus pain, sneezing and sore throat.  Onset of symptoms was 5 days ago, and has been gradually worsening since that time.  Treatment to date:  antihistamines and NSAIDs.  High risk factors for influenza complications:  none.  The following portions of the patient's history were reviewed and updated as appropriate:  allergies, current medications and past medical history.  Constitutional: positive for anorexia, fatigue and fevers, negative for night sweats, sweats and weight loss Eyes: negative Ears, nose, mouth, throat, and face: positive for nasal congestion, sore throat and bilateral ear pain/pressure, post-nasal drip, negative for ear drainage, earaches and hoarseness Respiratory: positive for cough, dyspnea on exertion and wheezing, negative for hemoptysis, pleurisy/chest pain, pneumonia and stridor Cardiovascular: negative Gastrointestinal: negative Neurological: positive for headaches, negative for coordination problems, dizziness, paresthesia, speech problems, tremors, vertigo and weakness Allergic/Immunologic: negative Objective:  BP 90/64   Pulse 87   Temp 98.4 F (36.9 C)   Wt 155 lb 3.2 oz (70.4 kg)   SpO2 99%   BMI 24.86 kg/m  General appearance: alert, cooperative, fatigued and no distress Head: Normocephalic, without obvious abnormality, atraumatic Eyes: conjunctivae/corneas clear. PERRL, EOM's intact. Fundi benign. Ears: abnormal TM right ear - mucoid middle ear fluid and abnormal TM left ear - mucoid middle ear fluid Nose: no discharge, mild congestion, turbinates swollen, inflamed, moderate maxillary sinus tenderness bilateral, mild frontal sinus tenderness bilateral Throat: lips, mucosa, and tongue normal; teeth  and gums normal Lungs: clear to auscultation bilaterally Heart: regular rate and rhythm, S1, S2 normal, no murmur, click, rub or gallop Abdomen: soft, non-tender; bowel sounds normal; no masses,  no organomegaly Pulses: 2+ and symmetric Skin: Skin color, texture, turgor normal. No rashes or lesions Lymph nodes: cervical and submandibular nodes normal Neurologic: Grossly normal    Assessment:  Acute Sinusitis    Plan:  Discussed diagnosis and treatment of sinusitis. Educational material distributed and questions answered. Suggested symptomatic OTC remedies. Supportive care with appropriate antipyretics and fluids. Nasal saline spray for congestion. Augmentin per orders. Nasal steroids per orders. Follow up as needed.  Coricidin HBP as directed.  Patient instructed to use humidifier at home, honey, and Tessalon Perles for cough.  Patient also given a prescription for fluconazole 150 mg tablets in case she develops a yeast infection while on the antibiotic.  Patient instructed to sleep elevated until her cough improves.  Work note provided for patient to return to work on November 06, 2017.  Patient verbalizes understanding and has no questions at time of discharge. Meds ordered this encounter  Medications  . amoxicillin-clavulanate (AUGMENTIN) 875-125 MG tablet    Sig: Take 1 tablet by mouth 2 (two) times daily for 10 days.    Dispense:  20 tablet    Refill:  0    Order Specific Question:   Supervising Provider    Answer:   Ricard Dillon [[id number]]  . fluticasone (FLONASE) 50 MCG/ACT nasal spray    Sig: Place 2 sprays into both nostrils daily for 10 days.    Dispense:  16 g    Refill:  0    Order Specific Question:   Supervising Provider    Answer:   Ricard Dillon [[id number]]  . benzonatate (TESSALON) 200 MG capsule    Sig: Take 1 capsule (200  mg total) by mouth 2 (two) times daily as needed for up to 10 days for cough.    Dispense:  20 capsule    Refill:  0    Order Specific Question:    Supervising Provider    Answer:   Ricard Dillon [[id number]]  . fluconazole (DIFLUCAN) 150 MG tablet    Sig: Take 1 tablet (150 mg total) by mouth once for 1 dose. May repeat every 72 hours as needed for up to 2 doses.    Dispense:  3 tablet    Refill:  0    Order Specific Question:   Supervising Provider    Answer:   Ricard Dillon [phone]

## 2017-11-05 NOTE — Patient Instructions (Signed)
Sinusitis, Adult Sinusitis is soreness and inflammation of your sinuses. Sinuses are hollow spaces in the bones around your face. Your sinuses are located:  Around your eyes.  In the middle of your forehead.  Behind your nose.  In your cheekbones.  Your sinuses and nasal passages are lined with a stringy fluid (mucus). Mucus normally drains out of your sinuses. When your nasal tissues become inflamed or swollen, the mucus can become trapped or blocked so air cannot flow through your sinuses. This allows bacteria, viruses, and funguses to grow, which leads to infection. Sinusitis can develop quickly and last for 7?10 days (acute) or for more than 12 weeks (chronic). Sinusitis often develops after a cold. What are the causes? This condition is caused by anything that creates swelling in the sinuses or stops mucus from draining, including:  Allergies.  Asthma.  Bacterial or viral infection.  Abnormally shaped bones between the nasal passages.  Nasal growths that contain mucus (nasal polyps).  Narrow sinus openings.  Pollutants, such as chemicals or irritants in the air.  A foreign object stuck in the nose.  A fungal infection. This is rare.  What increases the risk? The following factors may make you more likely to develop this condition:  Having allergies or asthma.  Having had a recent cold or respiratory tract infection.  Having structural deformities or blockages in your nose or sinuses.  Having a weak immune system.  Doing a lot of swimming or diving.  Overusing nasal sprays.  Smoking.  What are the signs or symptoms? The main symptoms of this condition are pain and a feeling of pressure around the affected sinuses. Other symptoms include:  Upper toothache.  Earache.  Headache.  Bad breath.  Decreased sense of smell and taste.  A cough that may get worse at night.  Fatigue.  Fever.  Thick drainage from your nose. The drainage is often green and  it may contain pus (purulent).  Stuffy nose or congestion.  Postnasal drip. This is when extra mucus collects in the throat or back of the nose.  Swelling and warmth over the affected sinuses.  Sore throat.  Sensitivity to light.  How is this diagnosed? This condition is diagnosed based on symptoms, a medical history, and a physical exam. To find out if your condition is acute or chronic, your health care provider may:  Look in your nose for signs of nasal polyps.  Tap over the affected sinus to check for signs of infection.  View the inside of your sinuses using an imaging device that has a light attached (endoscope).  If your health care provider suspects that you have chronic sinusitis, you may also:  Be tested for allergies.  Have a sample of mucus taken from your nose (nasal culture) and checked for bacteria.  Have a mucus sample examined to see if your sinusitis is related to an allergy.  If your sinusitis does not respond to treatment and it lasts longer than 8 weeks, you may have an MRI or CT scan to check your sinuses. These scans also help to determine how severe your infection is. In rare cases, a bone biopsy may be done to rule out more serious types of fungal sinus disease. How is this treated? Treatment for sinusitis depends on the cause and whether your condition is chronic or acute. If a virus is causing your sinusitis, your symptoms will go away on their own within 10 days. You may be given medicines to relieve your symptoms,   including:  Topical nasal decongestants. They shrink swollen nasal passages and let mucus drain from your sinuses.  Antihistamines. These drugs block inflammation that is triggered by allergies. This can help to ease swelling in your nose and sinuses.  Topical nasal corticosteroids. These are nasal sprays that ease inflammation and swelling in your nose and sinuses.  Nasal saline washes. These rinses can help to get rid of thick mucus in  your nose.  CORICIDIN HBP FOR COUGH AS DIRECTED.   If your condition is caused by bacteria, you will be given an antibiotic medicine. If your condition is caused by a fungus, you will be given an antifungal medicine. Surgery may be needed to correct underlying conditions, such as narrow nasal passages. Surgery may also be needed to remove polyps. Follow these instructions at home: Medicines  Take, use, or apply over-the-counter and prescription medicines only as told by your health care provider. These may include nasal sprays.  If you were prescribed an antibiotic medicine, take it as told by your health care provider. Do not stop taking the antibiotic even if you start to feel better. Hydrate and Humidify  Drink enough water to keep your urine clear or pale yellow. Staying hydrated will help to thin your mucus.  Use a cool mist humidifier to keep the humidity level in your home above 50%.  Inhale steam for 10-15 minutes, 3-4 times a day or as told by your health care provider. You can do this in the bathroom while a hot shower is running.  Limit your exposure to cool or dry air. Rest  Rest as much as possible.  Sleep with your head raised (elevated).  Make sure to get enough sleep each night. General instructions  Apply a warm, moist washcloth to your face 3-4 times a day or as told by your health care provider. This will help with discomfort.  Wash your hands often with soap and water to reduce your exposure to viruses and other germs. If soap and water are not available, use hand sanitizer.  Do not smoke. Avoid being around people who are smoking (secondhand smoke).  Keep all follow-up visits as told by your health care provider. This is important. Contact a health care provider if:  You have a fever.  Your symptoms get worse.  Your symptoms do not improve within 10 days. Get help right away if:  You have a severe headache.  You have persistent vomiting.  You  have pain or swelling around your face or eyes.  You have vision problems.  You develop confusion.  Your neck is stiff.  You have trouble breathing. This information is not intended to replace advice given to you by your health care provider. Make sure you discuss any questions you have with your health care provider. Document Released: 05/11/2005 Document Revised: 01/05/2016 Document Reviewed: 03/06/2015 Elsevier Interactive Patient Education  2018 [employer].  Cough, Adult Coughing is a reflex that clears your throat and your airways. Coughing helps to heal and protect your lungs. It is normal to cough occasionally, but a cough that happens with other symptoms or lasts a long time may be a sign of a condition that needs treatment. A cough may last only 2-3 weeks (acute), or it may last longer than 8 weeks (chronic). What are the causes? Coughing is commonly caused by:  Breathing in substances that irritate your lungs.  A viral or bacterial respiratory infection.  Allergies.  Asthma.  Postnasal drip.  Smoking.  Acid backing  up from the stomach into the esophagus (gastroesophageal reflux).  Certain medicines.  Chronic lung problems, including COPD (or rarely, lung cancer).  Other medical conditions such as heart failure.  Follow these instructions at home: Pay attention to any changes in your symptoms. Take these actions to help with your discomfort:  Take medicines only as told by your health care provider. ? If you were prescribed an antibiotic medicine, take it as told by your health care provider. Do not stop taking the antibiotic even if you start to feel better. ? Talk with your health care provider before you take a cough suppressant medicine.  Drink enough fluid to keep your urine clear or pale yellow.  If the air is dry, use a cold steam vaporizer or humidifier in your bedroom or your home to help loosen secretions.  Avoid anything that causes you to cough  at work or at home.  If your cough is worse at night, try sleeping in a semi-upright position.  Avoid cigarette smoke. If you smoke, quit smoking. If you need help quitting, ask your health care provider.  Avoid caffeine.  Avoid alcohol.  Rest as needed.  Contact a health care provider if:  You have new symptoms.  You cough up pus.  Your cough does not get better after 2-3 weeks, or your cough gets worse.  You cannot control your cough with suppressant medicines and you are losing sleep.  You develop pain that is getting worse or pain that is not controlled with pain medicines.  You have a fever.  You have unexplained weight loss.  You have night sweats. Get help right away if:  You cough up blood.  You have difficulty breathing.  Your heartbeat is very fast. This information is not intended to replace advice given to you by your health care provider. Make sure you discuss any questions you have with your health care provider. Document Released: 11/07/2010 Document Revised: 10/17/2015 Document Reviewed: 07/18/2014 Elsevier Interactive Patient Education  [employer].

## 2017-11-29 MED FILL — POTASSIUM CL ER 20 MEQ TABL: 20 | 90 days supply | Qty: 270 | Fill #0

## 2017-12-01 ENCOUNTER — Encounter: Payer: Self-pay | Admitting: Family Medicine

## 2017-12-01 ENCOUNTER — Ambulatory Visit: Payer: 59 | Admitting: Family Medicine

## 2017-12-01 VITALS — BP 98/62 | HR 80 | Temp 98.0°F | Resp 16 | Wt 161.8 lb

## 2017-12-01 DIAGNOSIS — R05 Cough: Secondary | ICD-10-CM | POA: Diagnosis not present

## 2017-12-01 DIAGNOSIS — J0100 Acute maxillary sinusitis, unspecified: Secondary | ICD-10-CM

## 2017-12-01 DIAGNOSIS — R059 Cough, unspecified: Secondary | ICD-10-CM

## 2017-12-01 DIAGNOSIS — R0982 Postnasal drip: Secondary | ICD-10-CM | POA: Diagnosis not present

## 2017-12-01 MED ORDER — FLUTICASONE PROPIONATE 50 MCG/ACT NA SUSP: 1.0000 | Freq: Every day | NASAL | 0 refills | Status: DC

## 2017-12-01 MED ORDER — AZITHROMYCIN 250 MG PO TABS: ORAL_TABLET | ORAL | 0 refills | Status: DC

## 2017-12-01 MED ORDER — FEXOFENADINE HCL 180 MG PO TABS: 180.0000 mg | ORAL_TABLET | Freq: Every day | ORAL | 1 refills | Status: DC

## 2017-12-01 MED FILL — AZITHROMYCIN 250 MG TABLET: 250 | 5 days supply | Qty: 6 | Fill #0

## 2017-12-01 MED FILL — FLUTICASONE PROP 50 MCG SPR: 50 | 60 days supply | Qty: 16 | Fill #0

## 2017-12-01 NOTE — Progress Notes (Signed)
Subjective:    Patient ID: Denise Campos, female    DOB: 1966/01/11, 52 y.o.   MRN: [id number]  Chief Complaint  Patient presents with  . Sinusitis    Patient here w/ recurrent sinusitis. States she "feels like I've been sick since I moved here from [place]."    HPI Patient was seen today for acute concern.  Endorses ongoing allergy/sinsus infection symptoms since moving to area.  [facility], given Augmentin, flonase, and tessalon with little to no relief.  Still having dry cough, ear pain/pressure/face pain/pressure, and throat irritation.  Using zyrtec.  Pt seen by ENT in the past. Denies fever, chills, n/v.  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  Past Medical History:  Diagnosis Date  . Anemia 06/21/2017   -patient reports chronic, normal for her  . Cancer ([place])   . GERD (gastroesophageal reflux disease)   . History of stomach ulcers   . Hypertension   . Migraines   . UTI (urinary tract infection)     Allergies  Allergen Reactions  . Bee Venom Anaphylaxis    ROS General: Denies fever, chills, night sweats, changes in weight, changes in appetite HEENT: Denies headaches, ear pain, changes in vision, rhinorrhea, sore throat  + facial and ear pain/pressure, throat irritation CV: Denies CP, palpitations, SOB, orthopnea Pulm: Denies SOB,  Wheezing  +dry cough GI: Denies abdominal pain, nausea, vomiting, diarrhea, constipation GU: Denies dysuria, hematuria, frequency, vaginal discharge Msk: Denies muscle cramps, joint pains Neuro: Denies weakness, numbness, tingling Skin: Denies rashes, bruising Psych: Denies depression, anxiety, hallucinations     Objective:    Blood pressure 98/62, pulse 80, temperature 98 F (36.7 C), temperature source Oral, resp. rate 16, weight 161 lb 12.8 oz (73.4 kg), SpO2 98 %.   Gen. Pleasant, well-nourished, in no distress, normal affect   HEENT: [place]/AT, face symmetric, no  scleral icterus, PERRLA, nares patent without drainage, pharynx with post nasal drainage, no erythema or exudate.  TMs full bilaterally.  No cervical lymphadenopathy. Lungs: no accessory muscle use, CTAB, no wheezes or rales Cardiovascular: RRR, no m/r/g, no peripheral edema Neuro:  A&Ox3, CN II-XII intact, normal gait  Wt Readings from Last 3 Encounters:  12/01/17 161 lb 12.8 oz (73.4 kg)  11/05/17 155 lb 3.2 oz (70.4 kg)  09/23/17 159 lb 9.6 oz (72.4 kg)    Lab Results  Component Value Date   WBC 8.1 09/23/2017   HGB 10.6 (L) 09/23/2017   HCT 32.0 (L) 09/29/2017   PLT 331.0 09/23/2017   GLUCOSE 89 09/23/2017   CHOL 147 06/10/2017   TRIG 66.0 06/10/2017   HDL 39.80 06/10/2017   LDLCALC 94 06/10/2017   NA 136 09/23/2017   K 3.8 09/23/2017   CL 101 09/23/2017   CREATININE 0.75 09/23/2017   BUN 11 09/23/2017   CO2 29 09/23/2017   HGBA1C 5.7 06/10/2017    Assessment/Plan:  Subacute maxillary sinusitis -Given handout - Plan: azithromycin (ZITHROMAX) 250 MG tablet  Post-nasal drainage -We will switch from Zyrtec to a different allergy medicine. - Plan: fluticasone (FLONASE) 50 MCG/ACT nasal spray, fexofenadine (ALLEGRA) 180 MG tablet  Cough -Likely 2/2 postnasal drainage -Given continued symptoms if no improvement on above therapy will obtain chest x-ray  Follow-up PRN  Grier Mitts, MD

## 2017-12-01 NOTE — Patient Instructions (Signed)
Postnasal Drip Postnasal drip is the feeling of mucus going down the back of your throat. Mucus is a slimy substance that moistens and cleans your nose and throat, as well as the air pockets in face bones near your forehead and cheeks (sinuses). Small amounts of mucus pass from your nose and sinuses down the back of your throat all the time. This is normal. When you produce too much mucus or the mucus gets too thick, you can feel it. Some common causes of postnasal drip include:  Having more mucus because of: ? A cold or the flu. ? Allergies. ? Cold air. ? Certain medicines.  Having more mucus that is thicker because of: ? A sinus or nasal infection. ? Dry air. ? A food allergy.  Follow these instructions at home: Relieving discomfort  Gargle with a salt-water mixture 3-4 times a day or as needed. To make a salt-water mixture, completely dissolve -1 tsp of salt in 1 cup of warm water.  If the air in your home is dry, use a humidifier to add moisture to the air.  Use a saline spray or container (neti pot) to flush out the nose (nasal irrigation). These methods can help clear away mucus and keep the nasal passages moist. General instructions  Take over-the-counter and prescription medicines only as told by your health care provider.  Follow instructions from your health care provider about eating or drinking restrictions. You may need to avoid caffeine.  Avoid things that you know you are allergic to (allergens), like dust, mold, pollen, pets, or certain foods.  Drink enough fluid to keep your urine pale yellow.  Keep all follow-up visits as told by your health care provider. This is important. Contact a health care provider if:  You have a fever.  You have a sore throat.  You have difficulty swallowing.  You have headache.  You have sinus pain.  You have a cough that does not go away.  The mucus from your nose becomes thick and is green or yellow in color.  You have  cold or flu symptoms that last more than 10 days. Summary  Postnasal drip is the feeling of mucus going down the back of your throat.  If your health care provider approves, use nasal irrigation or a nasal spray 2?4 times a day.  Avoid things that you know you are allergic to (allergens), like dust, mold, pollen, pets, or certain foods. This information is not intended to replace advice given to you by your health care provider. Make sure you discuss any questions you have with your health care provider. Document Released: 08/24/2016 Document Revised: 08/24/2016 Document Reviewed: 08/24/2016 Elsevier Interactive Patient Education  2018 [employer].  Sinusitis, Adult Sinusitis is soreness and inflammation of your sinuses. Sinuses are hollow spaces in the bones around your face. Your sinuses are located:  Around your eyes.  In the middle of your forehead.  Behind your nose.  In your cheekbones.  Your sinuses and nasal passages are lined with a stringy fluid (mucus). Mucus normally drains out of your sinuses. When your nasal tissues become inflamed or swollen, the mucus can become trapped or blocked so air cannot flow through your sinuses. This allows bacteria, viruses, and funguses to grow, which leads to infection. Sinusitis can develop quickly and last for 7?10 days (acute) or for more than 12 weeks (chronic). Sinusitis often develops after a cold. What are the causes? This condition is caused by anything that creates swelling in the  sinuses or stops mucus from draining, including:  Allergies.  Asthma.  Bacterial or viral infection.  Abnormally shaped bones between the nasal passages.  Nasal growths that contain mucus (nasal polyps).  Narrow sinus openings.  Pollutants, such as chemicals or irritants in the air.  A foreign object stuck in the nose.  A fungal infection. This is rare.  What increases the risk? The following factors may make you more likely to develop  this condition:  Having allergies or asthma.  Having had a recent cold or respiratory tract infection.  Having structural deformities or blockages in your nose or sinuses.  Having a weak immune system.  Doing a lot of swimming or diving.  Overusing nasal sprays.  Smoking.  What are the signs or symptoms? The main symptoms of this condition are pain and a feeling of pressure around the affected sinuses. Other symptoms include:  Upper toothache.  Earache.  Headache.  Bad breath.  Decreased sense of smell and taste.  A cough that may get worse at night.  Fatigue.  Fever.  Thick drainage from your nose. The drainage is often green and it may contain pus (purulent).  Stuffy nose or congestion.  Postnasal drip. This is when extra mucus collects in the throat or back of the nose.  Swelling and warmth over the affected sinuses.  Sore throat.  Sensitivity to light.  How is this diagnosed? This condition is diagnosed based on symptoms, a medical history, and a physical exam. To find out if your condition is acute or chronic, your health care provider may:  Look in your nose for signs of nasal polyps.  Tap over the affected sinus to check for signs of infection.  View the inside of your sinuses using an imaging device that has a light attached (endoscope).  If your health care provider suspects that you have chronic sinusitis, you may also:  Be tested for allergies.  Have a sample of mucus taken from your nose (nasal culture) and checked for bacteria.  Have a mucus sample examined to see if your sinusitis is related to an allergy.  If your sinusitis does not respond to treatment and it lasts longer than 8 weeks, you may have an MRI or CT scan to check your sinuses. These scans also help to determine how severe your infection is. In rare cases, a bone biopsy may be done to rule out more serious types of fungal sinus disease. How is this treated? Treatment for  sinusitis depends on the cause and whether your condition is chronic or acute. If a virus is causing your sinusitis, your symptoms will go away on their own within 10 days. You may be given medicines to relieve your symptoms, including:  Topical nasal decongestants. They shrink swollen nasal passages and let mucus drain from your sinuses.  Antihistamines. These drugs block inflammation that is triggered by allergies. This can help to ease swelling in your nose and sinuses.  Topical nasal corticosteroids. These are nasal sprays that ease inflammation and swelling in your nose and sinuses.  Nasal saline washes. These rinses can help to get rid of thick mucus in your nose.  If your condition is caused by bacteria, you will be given an antibiotic medicine. If your condition is caused by a fungus, you will be given an antifungal medicine. Surgery may be needed to correct underlying conditions, such as narrow nasal passages. Surgery may also be needed to remove polyps. Follow these instructions at home: Medicines  Take, use,  or apply over-the-counter and prescription medicines only as told by your health care provider. These may include nasal sprays.  If you were prescribed an antibiotic medicine, take it as told by your health care provider. Do not stop taking the antibiotic even if you start to feel better. Hydrate and Humidify  Drink enough water to keep your urine clear or pale yellow. Staying hydrated will help to thin your mucus.  Use a cool mist humidifier to keep the humidity level in your home above 50%.  Inhale steam for 10-15 minutes, 3-4 times a day or as told by your health care provider. You can do this in the bathroom while a hot shower is running.  Limit your exposure to cool or dry air. Rest  Rest as much as possible.  Sleep with your head raised (elevated).  Make sure to get enough sleep each night. General instructions  Apply a warm, moist washcloth to your face 3-4  times a day or as told by your health care provider. This will help with discomfort.  Wash your hands often with soap and water to reduce your exposure to viruses and other germs. If soap and water are not available, use hand sanitizer.  Do not smoke. Avoid being around people who are smoking (secondhand smoke).  Keep all follow-up visits as told by your health care provider. This is important. Contact a health care provider if:  You have a fever.  Your symptoms get worse.  Your symptoms do not improve within 10 days. Get help right away if:  You have a severe headache.  You have persistent vomiting.  You have pain or swelling around your face or eyes.  You have vision problems.  You develop confusion.  Your neck is stiff.  You have trouble breathing. This information is not intended to replace advice given to you by your health care provider. Make sure you discuss any questions you have with your health care provider. Document Released: 05/11/2005 Document Revised: 01/05/2016 Document Reviewed: 03/06/2015 Elsevier Interactive Patient Education  [employer].

## 2017-12-16 MED FILL — NORETHINDRONE 0.35 MG TAB: 0.35 | 84 days supply | Qty: 84 | Fill #3

## 2017-12-28 ENCOUNTER — Encounter: Payer: Self-pay | Admitting: Family Medicine

## 2017-12-28 ENCOUNTER — Ambulatory Visit: Payer: 59 | Admitting: Family Medicine

## 2017-12-28 VITALS — BP 100/80 | HR 83 | Temp 98.2°F | Ht 66.25 in | Wt 160.6 lb

## 2017-12-28 DIAGNOSIS — R300 Dysuria: Secondary | ICD-10-CM

## 2017-12-28 DIAGNOSIS — D649 Anemia, unspecified: Secondary | ICD-10-CM

## 2017-12-28 DIAGNOSIS — Z1211 Encounter for screening for malignant neoplasm of colon: Secondary | ICD-10-CM | POA: Diagnosis not present

## 2017-12-28 DIAGNOSIS — G8929 Other chronic pain: Secondary | ICD-10-CM | POA: Diagnosis not present

## 2017-12-28 DIAGNOSIS — R0982 Postnasal drip: Secondary | ICD-10-CM

## 2017-12-28 DIAGNOSIS — M5440 Lumbago with sciatica, unspecified side: Secondary | ICD-10-CM | POA: Diagnosis not present

## 2017-12-28 DIAGNOSIS — I10 Essential (primary) hypertension: Secondary | ICD-10-CM

## 2017-12-28 DIAGNOSIS — N809 Endometriosis, unspecified: Secondary | ICD-10-CM | POA: Diagnosis not present

## 2017-12-28 LAB — POCT URINALYSIS DIPSTICK
Bilirubin, UA: NEGATIVE
Glucose, UA: NEGATIVE
KETONES UA: NEGATIVE
Leukocytes, UA: NEGATIVE
NITRITE UA: NEGATIVE
Protein, UA: NEGATIVE
Urobilinogen, UA: 0.2 E.U./dL
pH, UA: 5 (ref 5.0–8.0)

## 2017-12-28 MED ORDER — CYCLOBENZAPRINE HCL 10 MG PO TABS: 10.0000 mg | ORAL_TABLET | Freq: Three times a day (TID) | ORAL | 1 refills | Status: DC | PRN

## 2017-12-28 MED ORDER — AMLODIPINE BESYLATE 2.5 MG PO TABS: 2.5000 mg | ORAL_TABLET | Freq: Every day | ORAL | 1 refills | Status: DC

## 2017-12-28 MED ORDER — FEXOFENADINE HCL 180 MG PO TABS: 180.0000 mg | ORAL_TABLET | Freq: Every day | ORAL | 1 refills | Status: DC

## 2017-12-28 MED ORDER — TRIAMTERENE-HCTZ 37.5-25 MG PO TABS: 1.0000 | ORAL_TABLET | Freq: Every day | ORAL | 1 refills | Status: DC

## 2017-12-28 MED ORDER — VALSARTAN 160 MG PO TABS: 160.0000 mg | ORAL_TABLET | Freq: Every day | ORAL | 1 refills | Status: DC

## 2017-12-28 MED ORDER — PREGABALIN 50 MG PO CAPS: 50.0000 mg | ORAL_CAPSULE | Freq: Three times a day (TID) | ORAL | 1 refills | Status: DC

## 2017-12-28 MED ORDER — RABEPRAZOLE SODIUM 20 MG PO TBEC: 20.0000 mg | DELAYED_RELEASE_TABLET | Freq: Every day | ORAL | 1 refills | Status: DC

## 2017-12-28 MED ORDER — FLUTICASONE PROPIONATE 50 MCG/ACT NA SUSP: 1.0000 | Freq: Every day | NASAL | 1 refills | Status: DC

## 2017-12-28 MED FILL — RABEPRAZOLE SOD DR 20 MG TA: 20 | 90 days supply | Qty: 90 | Fill #0

## 2017-12-28 MED FILL — AMLODIPINE 2.5 MG TABLET: 2.5 | 90 days supply | Qty: 90 | Fill #0

## 2017-12-28 MED FILL — PREGABALIN 50 MG CAPS: 50 | 90 days supply | Qty: 270 | Fill #0

## 2017-12-28 MED FILL — VALSARTAN 160 MG TABS: 160 | 90 days supply | Qty: 90 | Fill #0

## 2017-12-28 MED FILL — CYCLOBENZAPRINE 10 MG TAB: 10 | 30 days supply | Qty: 90 | Fill #0

## 2017-12-28 NOTE — Patient Instructions (Signed)
BEFORE YOU LEAVE: -Udip with reflex, Urine culture -stool cards for anemia -lab -90 days medications with 1 refill -follow up: 3-4 months  Complete the stool cards.  Schedule mammogram.  Follow up with your urologist.  We placed a referral for you as discussed for the colonoscopy and for the back pain. It usually takes about 1-2 weeks to process and schedule this referral. If you have not heard from [place] regarding this appointment in 2 weeks please contact our office.

## 2017-12-28 NOTE — Progress Notes (Signed)
HPI:  Using dictation device. Unfortunately this device frequently misinterprets words/phrases.  Denise Campos is a pleasant 52 y.o. here for follow up. Chronic medical problems summarized below were reviewed for changes. We advised follow up with her urologist about hematuria, colon cancer screening and hematology evaluation for her worsening anemia after her last visit. Advised gyn exam, mammogram and colon cancer screening at last visit.  Today reports that she followed up with urologist whom advised cystoscopy and CT, but she could not afford copay, so she is holding off on this. Today feels she may have another UTI as reports has had several days of urinary frequency and urgency. Denies any gorss hematuria since last visit, fevers or vomiting. She did not do the colon cancer screening as reports she wanted to do cologuard, but it was not covered by her insurance. Agrees to recheck cbc and seeing hematology if worsening or persistent anemia. Agrees to stool cards for anemia and referral to GI For colonoscopy for colon ca screening. Reports hx longstanding anemia, but was felt to be related to heavy menses in the past. Now on hormones with gyn and periods light and infrequent for the last year. She reports persistent chronic low back pain with radicular symptoms and now is agreeable to ortho or pmr referral here as injections were helpful in the past. Requests refills on her medications.   Mild normocytic anemia: -labs 05/2017 when establish, pt thought this was chronic per lab notes -worsening with Hgb 10.6 5/19 and advised seeing urology for hematuria, colon cancer screening and hematologist  Recurrent dysuria/hematuria/recurrentUTIs: -seeing [facility]  B12 deficiency: -Takes oral B12  Hypertension: -Medications include aspirin, valsartan, traim-hctz (from prior pcp) and Norvasc,potassium  Allergic rhinitis with recurrent strep: -Reports she saw several ear nose and throat  doctors in the past, Zyrtec has decreased her strep recurrence sinusitis x2 summer 2019  GERD: -History of gastric ulcer remotely -Medications include AcipHex, request refill -Denies any history of dysphasia, stricture, Barrett's or bleeding  Generalized anxiety disorder/insomnia: -Used Xanax rarely in the past, not using currently  Endometriosis: -On norethindrone for this to reduce periods -Plans to establish with gynecology  Migraines: -Uses triptan's regularly,these were related to periods and on the normethadone she has much fewer migraines  Chronic back pain: -Reports history of degenerative disc disease and several back surgeries -was seeing specialist prior to move to [place], had several injections in the past -Medications include Lyrica, Flexeril, Skelaxin sometimes, Mobic (started by prior physician)-reports the Mobic is the only way that she can work,reports has tolerated these medicines well  ROS: See pertinent positives and negatives per HPI.  Past Medical History:  Diagnosis Date  . Anemia 06/21/2017   -patient reports chronic, normal for her  . Cancer ([place])   . GERD (gastroesophageal reflux disease)   . History of stomach ulcers   . Hypertension   . Migraines   . UTI (urinary tract infection)     Past Surgical History:  Procedure Laterality Date  . APPENDECTOMY    . TONSILLECTOMY AND ADENOIDECTOMY      History reviewed. No pertinent family history.  SOCIAL HX: see hpi   Current Outpatient Medications:  .  amLODipine (NORVASC) 2.5 MG tablet, Take 1 tablet (2.5 mg total) by mouth daily., Disp: 90 tablet, Rfl: 1 .  Cholecalciferol (VITAMIN D3) 1000 units CAPS, Take 5,000 Units by mouth daily. , Disp: , Rfl:  .  cyclobenzaprine (FLEXERIL) 10 MG tablet, Take 1 tablet (10 mg total) by mouth 3 (  three) times daily as needed for muscle spasms., Disp: 90 tablet, Rfl: 1 .  EPINEPHrine (EPIPEN IJ), Inject as directed., Disp: , Rfl:  .  fexofenadine  (ALLEGRA) 180 MG tablet, Take 1 tablet (180 mg total) by mouth daily., Disp: 90 tablet, Rfl: 1 .  fluticasone (FLONASE) 50 MCG/ACT nasal spray, Place 1 spray into both nostrils daily., Disp: 48 g, Rfl: 1 .  ibuprofen (ADVIL,MOTRIN) 800 MG tablet, Take 1 tablet (800 mg total) by mouth every 8 (eight) hours as needed., Disp: 270 tablet, Rfl: 0 .  Magnesium 500 MG TABS, Take 500 mg by mouth daily., Disp: , Rfl:  .  naratriptan (AMERGE) 2.5 MG tablet, Take 1 tablet (2.5 mg total) by mouth as needed for migraine. Take one (1) tablet at onset of headache; if returns or does not resolve, may repeat after 4 hours; do not exceed five (5) mg in 24 hours., Disp: 30 tablet, Rfl: 0 .  norethindrone (MICRONOR,CAMILA,ERRIN) 0.35 MG tablet, Take 1 tablet by mouth daily., Disp: , Rfl:  .  potassium chloride SA (KLOR-CON M20) 20 MEQ tablet, Take 1 tablet (20 mEq total) by mouth 3 (three) times daily., Disp: 270 tablet, Rfl: 0 .  pregabalin (LYRICA) 50 MG capsule, Take 1 capsule (50 mg total) by mouth 3 (three) times daily., Disp: 270 capsule, Rfl: 1 .  RABEprazole (ACIPHEX) 20 MG tablet, Take 1 tablet (20 mg total) by mouth daily., Disp: 90 tablet, Rfl: 1 .  Saccharomyces boulardii (FLORASTOR PO), Take by mouth daily., Disp: , Rfl:  .  triamterene-hydrochlorothiazide (MAXZIDE-25) 37.5-25 MG tablet, Take 1 tablet by mouth at bedtime., Disp: 90 tablet, Rfl: 1 .  valsartan (DIOVAN) 160 MG tablet, Take 1 tablet (160 mg total) by mouth at bedtime., Disp: 90 tablet, Rfl: 1 .  vitamin B-12 (CYANOCOBALAMIN) 1000 MCG tablet, Take 1,000 mcg by mouth daily., Disp: , Rfl:   EXAM:  Vitals:   12/28/17 1556  BP: 100/80  Pulse: 83  Temp: 98.2 F (36.8 C)    Body mass index is 25.73 kg/m.  GENERAL: vitals reviewed and listed above, alert, oriented, appears well hydrated and in no acute distress  HEENT: atraumatic, conjunttiva clear, no obvious abnormalities on inspection of external nose and ears  NECK: no obvious  masses on inspection  LUNGS: clear to auscultation bilaterally, no wheezes, rales or rhonchi, good air movement  CV: HRRR, no peripheral edema  MS: moves all extremities without noticeable abnormality, gait normal  PSYCH: pleasant and cooperative, no obvious depression or anxiety  ASSESSMENT AND PLAN:  Discussed the following assessment and plan:  Chronic low back pain with sciatica, sciatica laterality unspecified, unspecified back pain laterality  - Plan: Ambulatory referral to Physical Medicine Rehab -medications refilled, discussed risks   Anemia, unspecified type - Plan: CBC, POC Hemoccult Bld/Stl (3-Cd Home Screen) -we discussed possible serious and likely etiologies, workup and treatment, treatment risks and return precautions -after this discussion, Tria opted for repeat cbc, stool cards for blood, complete colon ca screening, hematology eval if worsening or persists -follow  Dysuria - Plan: Urinalysis with Reflex Microscopic, Culture, Urine, POC Urinalysis Dipstick -advised must follow up with urology and advised she ask them about options to assist with financial issues associated with evaluation they advised -will advise assistant to fax urine results to her urologist  Essential hypertension - Plan: Basic metabolic panel, CBC -cont current meds  Endometriosis -sees gyn  Colon cancer screening - Plan: Ambulatory referral to Gastroenterology  Post-nasal drainage - Plan: fexofenadine (  ALLEGRA) 180 MG tablet, fluticasone (FLONASE) 50 MCG/ACT nasal spray  -Patient advised to return or notify a doctor immediately if symptoms worsen or persist or new concerns arise.  Patient Instructions  BEFORE YOU LEAVE: -Udip with reflex, Urine culture -stool cards for anemia -lab -90 days medications with 1 refill -follow up: 3-4 months  Complete the stool cards.  Schedule mammogram.  Follow up with your urologist.  We placed a referral for you as discussed for the  colonoscopy and for the back pain. It usually takes about 1-2 weeks to process and schedule this referral. If you have not heard from [place] regarding this appointment in 2 weeks please contact our office.         Lucretia Kern, DO

## 2017-12-29 LAB — BASIC METABOLIC PANEL
BUN: 11 mg/dL (ref 6–23)
CO2: 27 meq/L (ref 19–32)
Calcium: 9.4 mg/dL (ref 8.4–10.5)
Chloride: 99 mEq/L (ref 96–112)
Creatinine, Ser: 0.95 mg/dL (ref 0.40–1.20)
GFR: 65.31 mL/min (ref >60.00)
GLUCOSE: 88 mg/dL (ref 70–99)
POTASSIUM: 4.1 meq/L (ref 3.5–5.1)
SODIUM: 133 meq/L — AB (ref 135–145)

## 2017-12-29 LAB — URINE CULTURE
MICRO NUMBER: 90928864
Result:: NO GROWTH
SPECIMEN QUALITY: ADEQUATE

## 2017-12-29 LAB — URINALYSIS, ROUTINE W REFLEX MICROSCOPIC
BILIRUBIN URINE: NEGATIVE
KETONES UR: NEGATIVE
LEUKOCYTES UA: NEGATIVE
NITRITE: NEGATIVE
Specific Gravity, Urine: <=1.005 — AB (ref 1.000–1.030)
Total Protein, Urine: NEGATIVE
UROBILINOGEN UA: 0.2 (ref 0.0–1.0)
Urine Glucose: NEGATIVE
pH: 5.5 (ref 5.0–8.0)

## 2017-12-29 LAB — CBC
HCT: 35 % — ABNORMAL LOW (ref 36.0–46.0)
HEMOGLOBIN: 12.1 g/dL (ref 12.0–15.0)
MCHC: 34.5 g/dL (ref 30.0–36.0)
MCV: 93.2 fl (ref 78.0–100.0)
PLATELETS: 318 10*3/uL (ref 150.0–400.0)
RBC: 3.75 Mil/uL — AB (ref 3.87–5.11)
RDW: 12.5 % (ref 11.5–15.5)
WBC: 6.7 10*3/uL (ref 4.0–10.5)

## 2017-12-30 ENCOUNTER — Encounter: Payer: Self-pay | Admitting: Family Medicine

## 2017-12-30 NOTE — Addendum Note (Signed)
Addended by: Agnes Lawrence on: 12/30/2017 09:46 AM   Modules accepted: Orders

## 2018-01-05 LAB — POC HEMOCCULT BLD/STL (HOME/3-CARD/SCREEN)
Card #2 Fecal Occult Blod, POC: NEGATIVE
FECAL OCCULT BLD: NEGATIVE
Fecal Occult Blood, POC: NEGATIVE

## 2018-01-05 NOTE — Addendum Note (Signed)
Addended by: Rene Kocher on: 01/05/2018 04:33 PM   Modules accepted: Orders

## 2018-01-25 MED FILL — TRIAMTERENE/HCTZ 37.5/25 TB: 37.5-25 | 90 days supply | Qty: 90 | Fill #0

## 2018-02-03 ENCOUNTER — Encounter: Payer: Self-pay | Admitting: Family Medicine

## 2018-02-28 ENCOUNTER — Encounter: Payer: Self-pay | Admitting: Physical Medicine & Rehabilitation

## 2018-02-28 ENCOUNTER — Encounter: Payer: Self-pay | Admitting: Gastroenterology

## 2018-03-08 MED FILL — FLUTICASONE PROP 50 MCG SPR: 50 | 60 days supply | Qty: 16 | Fill #0

## 2018-03-08 MED FILL — POTASSIUM CL ER 20 MEQ TAB: 20 | 90 days supply | Qty: 270 | Fill #0

## 2018-03-31 ENCOUNTER — Ambulatory Visit: Payer: 59 | Admitting: Gastroenterology

## 2018-03-31 ENCOUNTER — Ambulatory Visit: Payer: 59 | Admitting: Family Medicine

## 2018-04-01 MED FILL — RABEPRAZOLE SOD DR 20 MG TA: 20 | 90 days supply | Qty: 90 | Fill #1

## 2018-04-01 MED FILL — AMLODIPINE 2.5 MG TABLET: 2.5 | 90 days supply | Qty: 90 | Fill #1

## 2018-04-01 MED FILL — VALSARTAN 160 MG TABLET: 160 | 90 days supply | Qty: 90 | Fill #1

## 2018-04-04 ENCOUNTER — Ambulatory Visit: Payer: 59 | Admitting: Physical Medicine & Rehabilitation

## 2018-04-05 ENCOUNTER — Ambulatory Visit: Payer: 59 | Admitting: Family Medicine

## 2018-04-07 ENCOUNTER — Ambulatory Visit (INDEPENDENT_AMBULATORY_CARE_PROVIDER_SITE_OTHER): Payer: Self-pay | Admitting: Family Medicine

## 2018-04-07 VITALS — BP 110/70 | HR 88 | Temp 98.6°F | Wt 160.0 lb

## 2018-04-07 DIAGNOSIS — J029 Acute pharyngitis, unspecified: Secondary | ICD-10-CM

## 2018-04-07 DIAGNOSIS — R059 Cough, unspecified: Secondary | ICD-10-CM

## 2018-04-07 DIAGNOSIS — R05 Cough: Secondary | ICD-10-CM

## 2018-04-07 LAB — POCT RAPID STREP A (OFFICE): RAPID STREP A SCREEN: NEGATIVE

## 2018-04-07 MED ORDER — AMOXICILLIN 875 MG PO TABS: 875.0000 mg | ORAL_TABLET | Freq: Two times a day (BID) | ORAL | 0 refills | Status: DC

## 2018-04-07 MED ORDER — PSEUDOEPH-BROMPHEN-DM 30-2-10 MG/5ML PO SYRP: 10.0000 mL | ORAL_SOLUTION | Freq: Three times a day (TID) | ORAL | 0 refills | Status: DC | PRN

## 2018-04-07 NOTE — Patient Instructions (Signed)

## 2018-04-07 NOTE — Progress Notes (Signed)
Denise Campos is a 52 y.o. female who presents today with concerns of sore throat after exposure to a patient who was infected. 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 She denies a fever but states she has malaise, and severe sore throat.  Review of Systems  Constitutional: Negative for chills, fever and malaise/fatigue.  HENT: Positive for sinus pain and sore throat. Negative for congestion, ear discharge and ear pain.   Eyes: Negative.   Respiratory: Positive for cough. Negative for sputum production and shortness of breath.   Cardiovascular: Negative.  Negative for chest pain.  Gastrointestinal: Negative for abdominal pain, diarrhea, nausea and vomiting.  Genitourinary: Negative for dysuria, frequency, hematuria and urgency.  Musculoskeletal: Negative for myalgias.  Skin: Negative.   Neurological: Negative for headaches.  Endo/Heme/Allergies: Negative.   Psychiatric/Behavioral: Negative.     O: Vitals:   04/07/18 1703  BP: 110/70  Pulse: 88  Temp: 98.6 F (37 C)  SpO2: 99%     Physical Exam  Constitutional: She is oriented to person, place, and time. Vital signs are normal. She appears well-developed and well-nourished. She is active.  Non-toxic appearance. She does not have a sickly appearance. She appears ill.  HENT:  Head: Normocephalic.  Right Ear: Hearing, tympanic membrane, external ear and ear canal normal.  Left Ear: Hearing, tympanic membrane, external ear and ear canal normal.  Nose: Mucosal edema and rhinorrhea present. Right sinus exhibits frontal sinus tenderness. Left sinus exhibits frontal sinus tenderness. Left sinus exhibits no maxillary sinus tenderness.  Mouth/Throat: Uvula is midline, oropharynx is clear and moist and mucous membranes are normal. Tonsils are 0 on the right. Tonsils are 0 on the left. No tonsillar exudate.   Hoarseness on exam  Neck: Normal range of motion. Neck supple.  Cardiovascular: Normal rate, regular rhythm, normal heart sounds and normal pulses.  Pulmonary/Chest: Effort normal and breath sounds normal.  Abdominal: Soft. Bowel sounds are normal.  Musculoskeletal: Normal range of motion.  Lymphadenopathy:       Head (right side): No submental and no submandibular adenopathy present.       Head (left side): No submental and no submandibular adenopathy present.    She has no cervical adenopathy.  Neurological: She is alert and oriented to person, place, and time.  Psychiatric: She has a normal mood and affect.  Vitals reviewed.   A: 1. Sore throat   2. Pharyngitis, unspecified etiology   3. Cough     P: Discussed exam findings, diagnosis etiology and medication use and indications reviewed with patient. Follow- Up and discharge instructions provided. No emergent/urgent issues found on exam.  Patient verbalized understanding of information provided and agrees with plan of care (POC), all questions answered.  Discussed with patient the likely viral nature of symptoms and advised that if symptoms not improved with antibiotic use to continue to discussed symptomatic supportive care measures and monitor condition over 10-14 days the likely viral course of symptoms.  1. Sore throat - POCT rapid strep A Results for orders placed or performed in visit on 04/07/18 (from the past 24 hour(s))  POCT rapid strep A     Status: Normal   Collection Time: 04/07/18  5:17 PM  Result Value Ref Range   Rapid Strep A Screen Negative Negative    2. Pharyngitis, unspecified etiology - amoxicillin (AMOXIL) 875 MG tablet; Take 1 tablet (875 mg  total) by mouth 2 (two) times daily. - brompheniramine-pseudoephedrine-DM 30-2-10 MG/5ML syrup; Take 10 mLs by mouth 3 (three) times daily as needed.  3. Cough - brompheniramine-pseudoephedrine-DM 30-2-10 MG/5ML syrup; Take 10 mLs by mouth 3 (three) times  daily as needed.

## 2018-04-14 MED FILL — TRIAMTERENE/HCTZ 37.5/25 TB: 37.5-25 | 90 days supply | Qty: 90 | Fill #1

## 2018-04-25 ENCOUNTER — Ambulatory Visit (INDEPENDENT_AMBULATORY_CARE_PROVIDER_SITE_OTHER): Payer: Self-pay | Admitting: Family Medicine

## 2018-04-25 DIAGNOSIS — Z111 Encounter for screening for respiratory tuberculosis: Secondary | ICD-10-CM

## 2018-04-25 NOTE — Patient Instructions (Signed)
Tuberculin Skin Test  Why am I having this test?  Tuberculosis (TB) is a bacterial infection caused by Mycobacterium tuberculosis. Most people who are exposed to these bacteria have a strong enough defense (immune) system to prevent the bacteria from causing TB and developing symptoms. Their bodies prevent the germs from being active and making them sick (latent TB infection).  However, if you have TB germs in your body and your immune system is weak, you can develop a TB infection. This can cause symptoms such as:  · Night sweats.  · Fever.  · Weakness.  · Weight loss.    A latent TB infection can also become active later in life if your immune system becomes weakened or compromised.  You may have this test if your health care provider suspects that you have TB. You may also have this test to screen for TB if you are at risk for getting the disease. Those at increased risk include:  · People who inject illegal drugs or share needles.  · People with HIV or other diseases that affect immunity.  · Health care workers.  · People who live in high-risk communities, such as homeless shelters, nursing homes, and correctional facilities.  · People who have been in contact with someone with TB.  · People from countries where TB is more common.    If you are in a high-risk group, your health care provider may wish to screen for TB more often. This can help prevent the spread of the disease. Sometimes TB screening is required when starting a new job, such as becoming a health care worker or a teacher. Colleges or universities may require it of new students.  What is being tested?  A tuberculin skin test is the main test used to check for exposure to the bacteria that can cause TB. The test checks for antibodies to the bacteria. Antibodies are proteins that your body produces to protect you from germs and other things that can make you sick.  Your health care provider will inject a solution known as PPD (purified  protein derivative) under the first layer of skin on your arm. This causes a blister-like bubble to form at the site. Your health care provider will then examine the site after a number of hours have passed to see if a reaction has occurred.  How do I prepare for this test?  There is no preparation required for this test.  What do the results mean?  Your test results will be reported as either negative or positive.  If the tuberculin skin test produces a negative result, it is likely that you do not have TB and have not been exposed to the TB bacteria.  If you or your health care provider suspects exposure, however, you may want to repeat the test a few weeks later. A blood test may also be used to check for TB. This is because you will not react to the tuberculin skin test until several weeks after exposure to TB bacteria.  If you test positive to the tuberculin skin test, it is likely that you have been exposed to TB bacteria. The test does not distinguish between an active and a latent TB infection.  A false-positive result can occur. A false-positive result for TB bacteria is incorrect because it indicates a condition or finding is present when it is not.  Talk to your health care provider to discuss your results, treatment options, and if necessary, the need for more tests.    It is your responsibility to obtain your test results. Ask the lab or department performing the test when and how you will get your results. Talk with your health care provider if you have any questions about your results.  Talk with your health care provider to discuss your results, treatment options, and if necessary, the need for more tests. Talk with your health care provider if you have any questions about your results.  This information is not intended to replace advice given to you by your health care provider. Make sure you discuss any questions you have with your health care provider.   Document Released: 02/18/2005 Document Revised: 01/12/2016 Document Reviewed: 09/04/2013  Elsevier Interactive Patient Education © 2018 [employer]vier Inc[employer]

## 2018-04-25 NOTE — Progress Notes (Signed)
Patient presents for PPD placement for school. Denies previous positive TB test  Denies known exposure to TB   Tuberculin skin test applied to left ventral forearm at 3:30 pm. Patient informed to return to [facility] in 48-72 hours for PPD read. Wednesday 04/27/18 - after 3:30 pm or Thursday 04/28/18 - before 3:30 pm.  Vaccine Information Statement provided to patient.

## 2018-04-26 NOTE — Progress Notes (Deleted)
Referring Provider: Lucretia Kern, DO Primary Care Physician:  Lucretia Kern, DO   Reason for Consultation: Hemorrhoids   IMPRESSION:  ***  PLAN: ***   HPI: Denise Campos is a 52 y.o. female seen in consultation at the request of Dr. Maudie Mercury for further evaluation of hemorrhoids.  She has unexplained normocytic anemia and a recent diagnosis of B12 deficiency.  She previously declined colonoscopy preferring Cologuard however her insurance would not cover a Cologuard.  GERD. History of gastric ulcer remotely. On Aciphex. Denies any history of dysphasia, stricture, Barrett's or bleeding  Fecal occult occult blood testing was negative times x3 in August 2019 labs from 12/28/2017 show a hemoglobin of 12.1, MCV 93.2, RDW 12.5.  Quality: Amount: Duration: Timing: Progression: Chronicity: Context: Similar prior episodes: Relieved by: Worsened by: Effective treatments: Ineffective treatments: Associated symptoms: Risks factors:   Past Medical History:  Diagnosis Date  . Anemia 06/21/2017   -patient reports chronic, normal for her  . Cancer ([place])   . GERD (gastroesophageal reflux disease)   . History of stomach ulcers   . Hypertension   . Migraines   . UTI (urinary tract infection)     Past Surgical History:  Procedure Laterality Date  . APPENDECTOMY    . TONSILLECTOMY AND ADENOIDECTOMY      Current Outpatient Medications  Medication Sig Dispense Refill  . amLODipine (NORVASC) 2.5 MG tablet Take 1 tablet (2.5 mg total) by mouth daily. 90 tablet 1  . amoxicillin (AMOXIL) 875 MG tablet Take 1 tablet (875 mg total) by mouth 2 (two) times daily. 20 tablet 0  . brompheniramine-pseudoephedrine-DM 30-2-10 MG/5ML syrup Take 10 mLs by mouth 3 (three) times daily as needed. 120 mL 0  . Cholecalciferol (VITAMIN D3) 1000 units CAPS Take 5,000 Units by mouth daily.     . cyclobenzaprine (FLEXERIL) 10 MG tablet Take 1 tablet (10 mg total) by mouth 3 (three) times daily as needed for  muscle spasms. 90 tablet 1  . EPINEPHrine (EPIPEN IJ) Inject as directed.    . fexofenadine (ALLEGRA) 180 MG tablet Take 1 tablet (180 mg total) by mouth daily. 90 tablet 1  . fluticasone (FLONASE) 50 MCG/ACT nasal spray Place 1 spray into both nostrils daily. 48 g 1  . ibuprofen (ADVIL,MOTRIN) 800 MG tablet Take 1 tablet (800 mg total) by mouth every 8 (eight) hours as needed. 270 tablet 0  . Magnesium 500 MG TABS Take 500 mg by mouth daily.    . naratriptan (AMERGE) 2.5 MG tablet Take 1 tablet (2.5 mg total) by mouth as needed for migraine. Take one (1) tablet at onset of headache; if returns or does not resolve, may repeat after 4 hours; do not exceed five (5) mg in 24 hours. 30 tablet 0  . norethindrone (MICRONOR,CAMILA,ERRIN) 0.35 MG tablet Take 1 tablet by mouth daily.    . potassium chloride SA (KLOR-CON M20) 20 MEQ tablet Take 1 tablet (20 mEq total) by mouth 3 (three) times daily. 270 tablet 0  . pregabalin (LYRICA) 50 MG capsule Take 1 capsule (50 mg total) by mouth 3 (three) times daily. 270 capsule 1  . RABEprazole (ACIPHEX) 20 MG tablet Take 1 tablet (20 mg total) by mouth daily. 90 tablet 1  . Saccharomyces boulardii (FLORASTOR PO) Take by mouth daily.    Marland Kitchen triamterene-hydrochlorothiazide (MAXZIDE-25) 37.5-25 MG tablet Take 1 tablet by mouth at bedtime. 90 tablet 1  . valsartan (DIOVAN) 160 MG tablet Take 1 tablet (160 mg total) by mouth  at bedtime. 90 tablet 1  . vitamin B-12 (CYANOCOBALAMIN) 1000 MCG tablet Take 1,000 mcg by mouth daily.     No current facility-administered medications for this visit.     Allergies as of 04/28/2018 - Review Complete 04/07/2018  Allergen Reaction Noted  . Bee venom Anaphylaxis 06/10/2017    No family history on file.  Social History   Socioeconomic History  . Marital status: Divorced    Spouse name: Not on file  . Number of children: Not on file  . Years of education: Not on file  . Highest education level: Not on file  Occupational  History  . Not on file  Social Needs  . Financial resource strain: Not on file  . Food insecurity:    Worry: Not on file    Inability: Not on file  . Transportation needs:    Medical: Not on file    Non-medical: Not on file  Tobacco Use  . Smoking status: Never Smoker  . Smokeless tobacco: Never Used  Substance and Sexual Activity  . Alcohol use: No    Frequency: Never  . Drug use: No  . Sexual activity: Never  Lifestyle  . Physical activity:    Days per week: Not on file    Minutes per session: Not on file  . Stress: Not on file  Relationships  . Social connections:    Talks on phone: Not on file    Gets together: Not on file    Attends religious service: Not on file    Active member of club or organization: Not on file    Attends meetings of clubs or organizations: Not on file    Relationship status: Not on file  . Intimate partner violence:    Fear of current or ex partner: Not on file    Emotionally abused: Not on file    Physically abused: Not on file    Forced sexual activity: Not on file  Other Topics Concern  . Not on file  Social History Narrative   Work or School: PICU nurse      Home Situation:      Spiritual Beliefs:      Lifestyle:    Review of Systems: 12 system ROS is negative except as noted above.  There were no vitals filed for this visit.  Physical Exam: Vital signs were reviewed. General:   Alert, well-nourished, pleasant and cooperative in NAD Head:  Normocephalic and atraumatic. Eyes:  Sclera clear, no icterus.   Conjunctiva pink. Mouth:  No deformity or lesions.   Neck:  Supple; no thyromegaly. Lungs:  Clear throughout to auscultation.   No wheezes.  Heart:  Regular rate and rhythm; no murmurs Abdomen:  Soft, nontender, normal bowel sounds. No rebound or guarding. No hepatosplenomegaly Rectal:  Deferred  Msk:  Symmetrical without gross deformities. Extremities:  No gross deformities or edema. Neurologic:  Alert and  oriented x4;   grossly nonfocal Skin:  No rash or bruise. Psych:  Alert and cooperative. Normal mood and affect.   Caitrin Pendergraph L. Tarri Glenn, MD, MPH [facility] 04/26/2018, 4:38 PM

## 2018-04-27 LAB — TB SKIN TEST
Induration: NEGATIVE mm
TB Skin Test: NEGATIVE

## 2018-04-28 ENCOUNTER — Ambulatory Visit: Payer: 59 | Admitting: Family Medicine

## 2018-04-28 ENCOUNTER — Ambulatory Visit: Payer: 59 | Admitting: Gastroenterology

## 2018-05-03 ENCOUNTER — Other Ambulatory Visit: Payer: Self-pay

## 2018-05-03 ENCOUNTER — Encounter (HOSPITAL_COMMUNITY): Payer: Self-pay

## 2018-05-03 ENCOUNTER — Emergency Department (HOSPITAL_COMMUNITY)
Admission: EM | Admit: 2018-05-03 | Discharge: 2018-05-03 | Disposition: A | Discharge status: Home / Self Care | Payer: 59 | Attending: Emergency Medicine | Admitting: Emergency Medicine

## 2018-05-03 DIAGNOSIS — R55 Syncope and collapse: Secondary | ICD-10-CM | POA: Diagnosis present

## 2018-05-03 DIAGNOSIS — Z859 Personal history of malignant neoplasm, unspecified: Secondary | ICD-10-CM | POA: Diagnosis not present

## 2018-05-03 DIAGNOSIS — R42 Dizziness and giddiness: Secondary | ICD-10-CM | POA: Diagnosis not present

## 2018-05-03 DIAGNOSIS — E860 Dehydration: Secondary | ICD-10-CM | POA: Insufficient documentation

## 2018-05-03 DIAGNOSIS — I10 Essential (primary) hypertension: Secondary | ICD-10-CM | POA: Diagnosis not present

## 2018-05-03 HISTORY — DX: Endometriosis, unspecified: N80.9

## 2018-05-03 LAB — COMPREHENSIVE METABOLIC PANEL
ALT: 25 U/L (ref 0–44)
AST: 26 U/L (ref 15–41)
Albumin: 4 g/dL (ref 3.5–5.0)
Alkaline Phosphatase: 85 U/L (ref 38–126)
Anion gap: 13 (ref 5–15)
BUN: 15 mg/dL (ref 6–20)
CO2: 24 mmol/L (ref 22–32)
Calcium: 9.8 mg/dL (ref 8.9–10.3)
Chloride: 100 mmol/L (ref 98–111)
Creatinine, Ser: 1.22 mg/dL — ABNORMAL HIGH (ref 0.44–1.00)
GFR calc Af Amer: 59 mL/min — ABNORMAL LOW (ref >60)
GFR, EST NON AFRICAN AMERICAN: 51 mL/min — AB (ref >60)
Glucose, Bld: 107 mg/dL — ABNORMAL HIGH (ref 70–99)
Potassium: 3 mmol/L — ABNORMAL LOW (ref 3.5–5.1)
Sodium: 137 mmol/L (ref 135–145)
Total Bilirubin: 0.4 mg/dL (ref 0.3–1.2)
Total Protein: 6.9 g/dL (ref 6.5–8.1)

## 2018-05-03 LAB — CBC
HCT: 37.2 % (ref 36.0–46.0)
Hemoglobin: 12.4 g/dL (ref 12.0–15.0)
MCH: 30.7 pg (ref 26.0–34.0)
MCHC: 33.3 g/dL (ref 30.0–36.0)
MCV: 92.1 fL (ref 80.0–100.0)
Platelets: 348 10*3/uL (ref 150–400)
RBC: 4.04 MIL/uL (ref 3.87–5.11)
RDW: 11.2 % — ABNORMAL LOW (ref 11.5–15.5)
WBC: 7.6 10*3/uL (ref 4.0–10.5)
nRBC: 0 % (ref 0.0–0.2)

## 2018-05-03 LAB — I-STAT BETA HCG BLOOD, ED (MC, WL, AP ONLY): I-stat hCG, quantitative: <5 m[IU]/mL (ref <5)

## 2018-05-03 LAB — I-STAT TROPONIN, ED: Troponin i, poc: 0 ng/mL (ref 0.00–0.08)

## 2018-05-03 LAB — LIPASE, BLOOD: Lipase: 32 U/L (ref 11–51)

## 2018-05-03 MED ORDER — SODIUM CHLORIDE 0.9 % IV BOLUS
1000.0000 mL | Freq: Once | INTRAVENOUS | Status: AC
  Administered 2018-05-03: 1000 mL via INTRAVENOUS

## 2018-05-03 MED ORDER — ONDANSETRON HCL 4 MG/2ML IJ SOLN
4.0000 mg | Freq: Once | INTRAMUSCULAR | Status: AC
  Administered 2018-05-03: 4 mg via INTRAVENOUS
  Filled 2018-05-03: qty 2

## 2018-05-03 MED ORDER — ONDANSETRON 4 MG PO TBDP: 4.0000 mg | ORAL_TABLET | Freq: Three times a day (TID) | ORAL | 0 refills | Status: DC | PRN

## 2018-05-03 NOTE — ED Notes (Signed)
ED Provider at bedside. 

## 2018-05-03 NOTE — ED Provider Notes (Signed)
[facility] Emergency Department Provider Note MRN:  [id number]  Arrival date & time: 05/03/18     Chief Complaint   Near Syncope; Dizziness; and Nausea   History of Present Illness   Denise Campos is a 52 y.o. year-old female with a history of hypertension, endometriosis presenting to the ED with chief complaint of near syncope.  Patient has been experiencing significant nausea, nonbloody nonbilious emesis, p.o. intolerance, mild diarrhea for 4 to 5 days.  Has been trying to keep up with her fluids but it has largely been unable to do so.  Tried to fight off her symptoms, went to work today, began to feel very lightheaded, very nauseated, knew she was going to pass out, sat on the Campos.  Did not fully lose consciousness.  No trauma.  No recent fever, no chest pain or shortness of breath, no significant abdominal pain, no dysuria, no vaginal bleeding, no vaginal discharge.  Review of Systems  A complete 10 system review of systems was obtained and all systems are negative except as noted in the HPI and PMH.   Patient's Health History    Past Medical History:  Diagnosis Date  . Anemia 06/21/2017   -patient reports chronic, normal for her  . Cancer ([place])   . Endometriosis   . GERD (gastroesophageal reflux disease)   . History of stomach ulcers   . Hypertension   . Migraines   . UTI (urinary tract infection)     Past Surgical History:  Procedure Laterality Date  . APPENDECTOMY    . TONSILLECTOMY AND ADENOIDECTOMY      No family history on file.  Social History   Socioeconomic History  . Marital status: Divorced    Spouse name: Not on file  . Number of children: Not on file  . Years of education: Not on file  . Highest education level: Not on file  Occupational History  . Not on file  Social Needs  . Financial resource strain: Not on file  . Food insecurity:    Worry: Not on file    Inability: Not on file  . Transportation needs:    Medical: Not on  file    Non-medical: Not on file  Tobacco Use  . Smoking status: Never Smoker  . Smokeless tobacco: Never Used  Substance and Sexual Activity  . Alcohol use: No    Frequency: Never  . Drug use: No  . Sexual activity: Never  Lifestyle  . Physical activity:    Days per week: Not on file    Minutes per session: Not on file  . Stress: Not on file  Relationships  . Social connections:    Talks on phone: Not on file    Gets together: Not on file    Attends religious service: Not on file    Active member of club or organization: Not on file    Attends meetings of clubs or organizations: Not on file    Relationship status: Not on file  . Intimate partner violence:    Fear of current or ex partner: Not on file    Emotionally abused: Not on file    Physically abused: Not on file    Forced sexual activity: Not on file  Other Topics Concern  . Not on file  Social History Narrative   Work or School: PICU nurse      Home Situation:      Spiritual Beliefs:      Lifestyle:  Physical Exam  Vital Signs and Nursing Notes reviewed Vitals:   05/03/18 2000 05/03/18 2030  BP: 105/68 111/71  Pulse: 73 76  Resp: 11 10  Temp:    SpO2: 100% 100%    CONSTITUTIONAL: Well-appearing, NAD NEURO:  Alert and oriented x 3, normal and symmetric strength and sensation, normal coordination, normal speech EYES:  eyes equal and reactive, normal extraocular movements, no nystagmus ENT/NECK:  no LAD, no JVD CARDIO: Regular rate, well-perfused, normal S1 and S2 PULM:  CTAB no wheezing or rhonchi GI/GU:  normal bowel sounds, non-distended, non-tender MSK/SPINE:  No gross deformities, no edema SKIN:  no rash, atraumatic PSYCH:  Appropriate speech and behavior  Diagnostic and Interventional Summary    EKG Interpretation  Date/Time:  Tuesday May 03 2018 19:25:20 EST Ventricular Rate:  88 PR Interval:    QRS Duration: 94 QT Interval:  367 QTC Calculation: 444 R Axis:   88 Text  Interpretation:  Sinus rhythm Probable left atrial enlargement Confirmed by Gerlene Fee [phone]) on 05/03/2018 8:19:32 PM      Labs Reviewed  CBC - Abnormal; Notable for the following components:      Result Value   RDW 11.2 (*)    All other components within normal limits  COMPREHENSIVE METABOLIC PANEL - Abnormal; Notable for the following components:   Potassium 3.0 (*)    Glucose, Bld 107 (*)    Creatinine, Ser 1.22 (*)    GFR calc non Af Amer 51 (*)    GFR calc Af Amer 59 (*)    All other components within normal limits  LIPASE, BLOOD  I-STAT BETA HCG BLOOD, ED ([facility], [facility], [facility] ONLY)  I-STAT TROPONIN, ED    No orders to display    Medications  sodium chloride 0.9 % bolus 1,000 mL (0 mLs Intravenous Stopped 05/03/18 2041)  ondansetron (ZOFRAN) injection 4 mg (4 mg Intravenous Given 05/03/18 1945)  sodium chloride 0.9 % bolus 1,000 mL (0 mLs Intravenous Stopped 05/03/18 2041)     Procedures Critical Care  ED Course and Medical Decision Making  I have reviewed the triage vital signs and the nursing notes.  Pertinent labs & imaging results that were available during my care of the patient were reviewed by me and considered in my medical decision making (see below for details).  Favoring dehydration in this 52 year old female with history of hypertension here with near syncopal episode after several days of nausea vomiting and diarrhea.  Patient mentioned that her dizziness briefly felt like the room was spinning, but has mostly been a lightheaded sensation.  Patient has a completely normal neurological exam with no nystagmus, no risk factors and little to no concern for central vertigo.  Will provide IV fluids, obtain basic labs, reassess.  Abdomen is soft and nontender.  Labs reassuring, mild hypokalemia.  Patient feeling much better after 2 L IV fluids, tolerating p.o. here in the emergency department.  Appropriate for discharge, prescription for Zofran.  After the discussed  management above, the patient was determined to be safe for discharge.  The patient was in agreement with this plan and all questions regarding their care were answered.  ED return precautions were discussed and the patient will return to the ED with any significant worsening of condition.  Barth Kirks. Sedonia Small, [place] [email or web address]  Final Clinical Impressions(s) / ED Diagnoses     ICD-10-CM   1. Dehydration E86.0     ED Discharge Orders  Ordered    ondansetron (ZOFRAN ODT) 4 MG disintegrating tablet  Every 8 hours PRN     05/03/18 2118             Maudie Flakes, MD 05/03/18 2119

## 2018-05-03 NOTE — Discharge Instructions (Addendum)
You were evaluated in the Emergency Department and after careful evaluation, we did not find any emergent condition requiring admission or further testing in the hospital.  Your symptoms today seem to be due to dehydration related to her recent GI illness.  Please drink plenty of fluids at home and use the Zofran prescription provided as needed for nausea.  Please return to the Emergency Department if you experience any worsening of your condition.  We encourage you to follow up with a primary care provider.  Thank you for allowing [place] to be a part of your care.

## 2018-05-03 NOTE — ED Triage Notes (Signed)
Pt c.o severe dizziness all day, dealing with a GI bug all week with n/v. Pt came to work tonight and had a near syncopal episode and nausea. Pt alert and oreinted, VSS

## 2018-05-10 ENCOUNTER — Ambulatory Visit (INDEPENDENT_AMBULATORY_CARE_PROVIDER_SITE_OTHER): Payer: Self-pay | Admitting: Family Medicine

## 2018-05-10 ENCOUNTER — Ambulatory Visit: Payer: 59 | Admitting: Family Medicine

## 2018-05-10 VITALS — BP 112/70 | HR 84 | Temp 98.6°F | Resp 20 | Ht 67.0 in | Wt 160.4 lb

## 2018-05-10 DIAGNOSIS — Z020 Encounter for examination for admission to educational institution: Secondary | ICD-10-CM

## 2018-05-10 NOTE — Progress Notes (Signed)
Denise Campos is a 52 y.o. female who presents today with concerns of need for a school physical exam. She has her form with her. She has chronic health conditions of HTN, hx of asthma, GERD, and migraines. She reports these conditions are well managed and under the care of her PCP. She denies any major health changes in the past 12 months. Of note during a GI bug earlier this month she was seen in ED for near syncope and diagnosed with dehydration. She is well since that time and denies any residual issues related to this concern.  Review of Systems  Constitutional: Negative for chills, fever and malaise/fatigue.  HENT: Negative for congestion, ear discharge, ear pain, sinus pain and sore throat.   Eyes: Negative.   Respiratory: Negative for cough, sputum production and shortness of breath.   Cardiovascular: Negative.  Negative for chest pain.  Gastrointestinal: Negative for abdominal pain, diarrhea, nausea and vomiting.  Genitourinary: Negative for dysuria, frequency, hematuria and urgency.  Musculoskeletal: Negative for myalgias.  Skin: Negative.   Neurological: Negative for headaches.  Endo/Heme/Allergies: Negative.   Psychiatric/Behavioral: Negative.     O: Vitals:   05/10/18 0927  BP: 112/70  Pulse: 84  Resp: 20  Temp: 98.6 F (37 C)  SpO2: 97%     Physical Exam Vitals signs reviewed.  Constitutional:      Appearance: She is well-developed. She is not toxic-appearing.  HENT:     Head: Normocephalic.     Right Ear: Hearing, tympanic membrane, ear canal and external ear normal.     Left Ear: Hearing, tympanic membrane, ear canal and external ear normal.     Nose: Nose normal.     Mouth/Throat:     Pharynx: Uvula midline.  Neck:     Musculoskeletal: Normal range of motion and neck supple.  Cardiovascular:     Rate and Rhythm: Normal rate and regular rhythm.     Pulses: Normal pulses.     Heart sounds: Normal heart sounds.  Pulmonary:     Effort: Pulmonary effort is  normal.     Breath sounds: Normal breath sounds.  Abdominal:     General: Bowel sounds are normal.     Palpations: Abdomen is soft.  Musculoskeletal: Normal range of motion.  Lymphadenopathy:     Head:     Right side of head: No submental or submandibular adenopathy.     Left side of head: No submental or submandibular adenopathy.     Cervical: No cervical adenopathy.  Neurological:     Mental Status: She is alert and oriented to person, place, and time.    A: 1. Encounter for school history and physical examination    P: Discussed exam findings, diagnosis etiology and medication use and indications reviewed with patient. Follow- Up and discharge instructions provided. No emergent/urgent issues found on exam.  Patient verbalized understanding of information provided and agrees with plan of care (POC), all questions answered.  1. Encounter for school history and physical examination Exam completed- WNL- no acute findings- form filled out and returned- copy placed in record for reference.

## 2018-05-10 NOTE — Patient Instructions (Addendum)

## 2018-05-11 ENCOUNTER — Encounter: Payer: Self-pay | Admitting: Family Medicine

## 2018-05-11 NOTE — Progress Notes (Signed)
HPI:  Using dictation device. Unfortunately this device frequently misinterprets words/phrases.  Denise Campos is a pleasant 52 y.o. here for follow up. Complicated PMH summarized below reviewed for changes and stability and updated as needed.  She has gone to [facility] for several minor issues and her physical since her last visit here. Also had an emergency room visit a few weeks ago for dehydration. She is concerned after reading her EKG results online that noted an atrial enlargement - reports the ER doc did not tell her anything about this. Was dehydrated with electrolyte abnormalities at the time. Feels all better now. No CP, DOE, palpitations. No symptoms when does stairs at work.  We have advised her on multiple preventive measures due on several occassions, but she has not completed colon cancer screening, mammogram but plans to call to reschedule as was dealing with a death in the family which caused this delay. Requesting refills of medications. Will be seeing PMR in the new year. Denies CP, SOB, DOE, treatment intolerance or new symptoms. Due for recheck bmp (hypokalemia and bump in Cr recent labs at ER), colon cancer screening, mammo, flu shot.  Mild normocytic anemia: -labs 05/2017 when establish, pt thought this was chronic per lab notes  -hx heavy menses resolved in 2019 with hormonal treatment -worsening with Hgb 10.6 5/19 and advised seeing urology for hematuria, colon cancer screening and hematologist - however, anemia then resolved spontaneously so she did not see hematologist  Recurrent dysuria/hematuria/recurrentUTIs: -seeing [facility]  B12 deficiency: -Takes oral B12  Hypertension: -Medications include aspirin,valsartan,traim-hctz (from prior pcp)and Norvasc,potassium  Allergic rhinitis with recurrent strep: -Reports she saw several ear nose and throat doctors in the past, Zyrtec has decreased her strep recurrence sinusitis x2 summer  2019  GERD: -History of gastric ulcer remotely -Medications include AcipHex -Denied any history of dysphasia, stricture, Barrett's or bleeding  Generalized anxiety disorder/insomnia: -Used Xanax rarely in the past, not using currently  Endometriosis: -On norethindrone for this to reduce periods -per gyn  Migraines: -Uses triptan's regularly,these were related to periods and on the norethindrone she has much fewer migraines  Chronic back pain: -Reports history of degenerative disc disease and several back surgeries -was seeing specialist prior to move to [place], had several injections in the past -Medications include Lyrica, Flexeril, Skelaxin sometimes, Mobic(started by prior physician)-reports the Mobic is the only way that she can work,reports has tolerated these medicines well -agreed to referral locally in 2019 - referral to [facility] placed 8/19  ROS: See pertinent positives and negatives per HPI.  Past Medical History:  Diagnosis Date  . Anemia 06/21/2017   -patient reports chronic, normal for her  . B12 deficiency 06/10/2017  . Cancer ([place])   . Endometriosis   . Essential hypertension 06/10/2017  . GERD (gastroesophageal reflux disease)   . History of stomach ulcers   . Hypertension   . Migraines   . Other chronic pain 06/10/2017  . UTI (urinary tract infection)     Past Surgical History:  Procedure Laterality Date  . APPENDECTOMY    . TONSILLECTOMY AND ADENOIDECTOMY      History reviewed. No pertinent family history.  SOCIAL HX: see hpi   Current Outpatient Medications:  .  amLODipine (NORVASC) 2.5 MG tablet, Take 1 tablet (2.5 mg total) by mouth daily., Disp: 90 tablet, Rfl: 1 .  Cholecalciferol (VITAMIN D3) 1000 units CAPS, Take 5,000 Units by mouth daily. , Disp: , Rfl:  .  cyclobenzaprine (FLEXERIL) 10 MG tablet, Take 1 tablet (  10 mg total) by mouth 3 (three) times daily as needed for muscle spasms., Disp: 90 tablet, Rfl: 1 .  EPINEPHrine (EPIPEN IJ),  Inject as directed., Disp: , Rfl:  .  fexofenadine (ALLEGRA) 180 MG tablet, Take 1 tablet (180 mg total) by mouth daily., Disp: 90 tablet, Rfl: 1 .  fluticasone (FLONASE) 50 MCG/ACT nasal spray, Place 1 spray into both nostrils daily., Disp: 48 g, Rfl: 1 .  ibuprofen (ADVIL,MOTRIN) 800 MG tablet, Take 1 tablet (800 mg total) by mouth every 8 (eight) hours as needed., Disp: 270 tablet, Rfl: 0 .  Magnesium 500 MG TABS, Take 500 mg by mouth daily., Disp: , Rfl:  .  naratriptan (AMERGE) 2.5 MG tablet, Take 1 tablet (2.5 mg total) by mouth as needed for migraine. Take one (1) tablet at onset of headache; if returns or does not resolve, may repeat after 4 hours; do not exceed five (5) mg in 24 hours., Disp: 30 tablet, Rfl: 0 .  potassium chloride SA (KLOR-CON M20) 20 MEQ tablet, Take 1 tablet (20 mEq total) by mouth 3 (three) times daily., Disp: 270 tablet, Rfl: 1 .  pregabalin (LYRICA) 50 MG capsule, Take 1 capsule (50 mg total) by mouth 3 (three) times daily., Disp: 270 capsule, Rfl: 1 .  RABEprazole (ACIPHEX) 20 MG tablet, Take 1 tablet (20 mg total) by mouth daily., Disp: 90 tablet, Rfl: 1 .  Saccharomyces boulardii (FLORASTOR PO), Take by mouth daily., Disp: , Rfl:  .  triamterene-hydrochlorothiazide (MAXZIDE-25) 37.5-25 MG tablet, Take 1 tablet by mouth at bedtime., Disp: 90 tablet, Rfl: 1 .  valsartan (DIOVAN) 160 MG tablet, Take 1 tablet (160 mg total) by mouth at bedtime., Disp: 90 tablet, Rfl: 1 .  vitamin B-12 (CYANOCOBALAMIN) 1000 MCG tablet, Take 1,000 mcg by mouth daily., Disp: , Rfl:   EXAM:  Vitals:   05/12/18 0909  BP: 102/80  Pulse: 77  Temp: 98.1 F (36.7 C)    Body mass index is 25.18 kg/m.  GENERAL: vitals reviewed and listed above, alert, oriented, appears well hydrated and in no acute distress  HEENT: atraumatic, conjunttiva clear, no obvious abnormalities on inspection of external nose and ears  NECK: no obvious masses on inspection  LUNGS: clear to auscultation  bilaterally, no wheezes, rales or rhonchi, good air movement  CV: HRRR, no peripheral edema  MS: moves all extremities without noticeable abnormality  PSYCH: pleasant and cooperative, no obvious depression or anxiety  ASSESSMENT AND PLAN:  Discussed the following assessment and plan:  Essential hypertension - Plan: Basic metabolic panel  Colon cancer screening  Anemia, unspecified type  Other chronic pain  B12 deficiency  Gastroesophageal reflux disease without esophagitis  Post-nasal drainage - Plan: fluticasone (FLONASE) 50 MCG/ACT nasal spray  -advised recheck BMP and if normal nurse visit for EKG (review of some ER EKGs borderline)- if abnormal will have her see cardiology as she is quite concerned -labs per orders -refills  -advised of health maintenance due - she agrees to schedule -follow up 3-4 months, sooner as needed  Patient Instructions  BEFORE YOU LEAVE: -lab -update HM -refills today -follow up: 3-4 months  Get your mammogram, colonoscopy and set up your gyn appointment ASAP.  We have ordered labs or studies at this visit. It can take up to 1-2 weeks for results and processing. IF results require follow up or explanation, we will call you with instructions. Clinically stable results will be released to your [facility]. If you have not heard from [place] or  cannot find your results in [facility] in 2 weeks please contact our office at [phone].  If you are not yet signed up for [facility], please consider signing up.          Lucretia Kern, DO

## 2018-05-12 ENCOUNTER — Encounter: Payer: Self-pay | Admitting: Family Medicine

## 2018-05-12 ENCOUNTER — Telehealth: Payer: Self-pay | Admitting: *Deleted

## 2018-05-12 ENCOUNTER — Ambulatory Visit: Payer: 59 | Admitting: Family Medicine

## 2018-05-12 VITALS — BP 102/80 | HR 77 | Temp 98.1°F | Ht 67.0 in | Wt 160.8 lb

## 2018-05-12 DIAGNOSIS — K219 Gastro-esophageal reflux disease without esophagitis: Secondary | ICD-10-CM

## 2018-05-12 DIAGNOSIS — Z1211 Encounter for screening for malignant neoplasm of colon: Secondary | ICD-10-CM | POA: Diagnosis not present

## 2018-05-12 DIAGNOSIS — I10 Essential (primary) hypertension: Secondary | ICD-10-CM

## 2018-05-12 DIAGNOSIS — E538 Deficiency of other specified B group vitamins: Secondary | ICD-10-CM | POA: Diagnosis not present

## 2018-05-12 DIAGNOSIS — G8929 Other chronic pain: Secondary | ICD-10-CM | POA: Diagnosis not present

## 2018-05-12 DIAGNOSIS — D649 Anemia, unspecified: Secondary | ICD-10-CM

## 2018-05-12 DIAGNOSIS — R0982 Postnasal drip: Secondary | ICD-10-CM | POA: Diagnosis not present

## 2018-05-12 LAB — BASIC METABOLIC PANEL
BUN: 18 mg/dL (ref 6–23)
CO2: 25 mEq/L (ref 19–32)
CREATININE: 1.05 mg/dL (ref 0.40–1.20)
Calcium: 9.4 mg/dL (ref 8.4–10.5)
Chloride: 103 mEq/L (ref 96–112)
GFR: 58.1 mL/min — ABNORMAL LOW (ref >60.00)
Glucose, Bld: 94 mg/dL (ref 70–99)
Potassium: 3.9 mEq/L (ref 3.5–5.1)
Sodium: 138 mEq/L (ref 135–145)

## 2018-05-12 MED ORDER — FLUTICASONE PROPIONATE 50 MCG/ACT NA SUSP: 1.0000 | Freq: Every day | NASAL | 1 refills | Status: AC

## 2018-05-12 MED ORDER — AMLODIPINE BESYLATE 2.5 MG PO TABS: 2.5000 mg | ORAL_TABLET | Freq: Every day | ORAL | 1 refills | Status: DC

## 2018-05-12 MED ORDER — POTASSIUM CHLORIDE CRYS ER 20 MEQ PO TBCR: 20.0000 meq | EXTENDED_RELEASE_TABLET | Freq: Three times a day (TID) | ORAL | 1 refills | Status: DC

## 2018-05-12 MED ORDER — IBUPROFEN 800 MG PO TABS: 800.0000 mg | ORAL_TABLET | Freq: Three times a day (TID) | ORAL | 0 refills | Status: DC | PRN

## 2018-05-12 MED ORDER — TRIAMTERENE-HCTZ 37.5-25 MG PO TABS: 1.0000 | ORAL_TABLET | Freq: Every day | ORAL | 1 refills | Status: DC

## 2018-05-12 MED ORDER — RABEPRAZOLE SODIUM 20 MG PO TBEC: 20.0000 mg | DELAYED_RELEASE_TABLET | Freq: Every day | ORAL | 1 refills | Status: DC

## 2018-05-12 MED ORDER — VALSARTAN 160 MG PO TABS: 160.0000 mg | ORAL_TABLET | Freq: Every day | ORAL | 1 refills | Status: DC

## 2018-05-12 MED ORDER — PREGABALIN 50 MG PO CAPS: 50.0000 mg | ORAL_CAPSULE | Freq: Three times a day (TID) | ORAL | 1 refills | Status: DC

## 2018-05-12 MED ORDER — CYCLOBENZAPRINE HCL 10 MG PO TABS: 10.0000 mg | ORAL_TABLET | Freq: Three times a day (TID) | ORAL | 1 refills | Status: DC | PRN

## 2018-05-12 MED FILL — CYCLOBENZAPRINE 10 MG TAB: 10 | 30 days supply | Qty: 90 | Fill #0

## 2018-05-12 MED FILL — IBUPROFEN 800 MG TAB: 800 | 90 days supply | Qty: 270 | Fill #0

## 2018-05-12 MED FILL — FLUTICASONE PROP 50 MCG SPR: 50 | 60 days supply | Qty: 16 | Fill #0

## 2018-05-12 MED FILL — PREGABALIN 50 MG CAPS: 50 | 90 days supply | Qty: 270 | Fill #0

## 2018-05-12 NOTE — Telephone Encounter (Signed)
Per Dr Maudie Mercury I left a detailed message at her voicemail stating she reviewed the EKGs she had done at the ER.  Dr Maudie Mercury stated there was a mild abnormality and she was dehydrated at that time and she would like to have her come back in for an appt with her and to repeat the EKG.  I asked that she call back for an appt.

## 2018-05-12 NOTE — Patient Instructions (Signed)
BEFORE YOU LEAVE: -lab -update HM -refills today -follow up: 3-4 months  Get your mammogram, colonoscopy and set up your gyn appointment ASAP.  We have ordered labs or studies at this visit. It can take up to 1-2 weeks for results and processing. IF results require follow up or explanation, we will call you with instructions. Clinically stable results will be released to your [facility]. If you have not heard from [place] or cannot find your results in [facility] in 2 weeks please contact our office at [phone].  If you are not yet signed up for [facility], please consider signing up.

## 2018-05-17 ENCOUNTER — Ambulatory Visit: Payer: 59 | Admitting: Family Medicine

## 2018-05-17 ENCOUNTER — Encounter: Payer: Self-pay | Admitting: Family Medicine

## 2018-05-17 VITALS — BP 116/80 | HR 84 | Temp 98.0°F | Ht 67.0 in | Wt 160.1 lb

## 2018-05-17 DIAGNOSIS — R9431 Abnormal electrocardiogram [ECG] [EKG]: Secondary | ICD-10-CM | POA: Diagnosis not present

## 2018-05-17 NOTE — Progress Notes (Signed)
HPI:  Using dictation device. Unfortunately this device frequently misinterprets words/phrases.  Acute visit for EKG abnormality. She noticed this when she checked her mychart results from a recent emergency room visit for dehydration from a stomach bug. At the time she was prety sick with electrolyte abnormalities. But, when she saw the abnormalities on line she was very worried. No CP, DOE, palpitations, exertional dyspnea or any other symptoms.   ROS: See pertinent positives and negatives per HPI.  Past Medical History:  Diagnosis Date  . Anemia 06/21/2017   -patient reports chronic, normal for her  . B12 deficiency 06/10/2017  . Cancer ([place])   . Endometriosis   . Essential hypertension 06/10/2017  . GERD (gastroesophageal reflux disease)   . History of stomach ulcers   . Hypertension   . Migraines   . Other chronic pain 06/10/2017  . UTI (urinary tract infection)     Past Surgical History:  Procedure Laterality Date  . APPENDECTOMY    . TONSILLECTOMY AND ADENOIDECTOMY      History reviewed. No pertinent family history.  SOCIAL HX: see hpi   Current Outpatient Medications:  .  amLODipine (NORVASC) 2.5 MG tablet, Take 1 tablet (2.5 mg total) by mouth daily., Disp: 90 tablet, Rfl: 1 .  Cholecalciferol (VITAMIN D3) 1000 units CAPS, Take 5,000 Units by mouth daily. , Disp: , Rfl:  .  cyclobenzaprine (FLEXERIL) 10 MG tablet, Take 1 tablet (10 mg total) by mouth 3 (three) times daily as needed for muscle spasms., Disp: 90 tablet, Rfl: 1 .  EPINEPHrine (EPIPEN IJ), Inject as directed., Disp: , Rfl:  .  fexofenadine (ALLEGRA) 180 MG tablet, Take 1 tablet (180 mg total) by mouth daily., Disp: 90 tablet, Rfl: 1 .  fluticasone (FLONASE) 50 MCG/ACT nasal spray, Place 1 spray into both nostrils daily., Disp: 48 g, Rfl: 1 .  ibuprofen (ADVIL,MOTRIN) 800 MG tablet, Take 1 tablet (800 mg total) by mouth every 8 (eight) hours as needed., Disp: 270 tablet, Rfl: 0 .  Magnesium 500 MG TABS,  Take 500 mg by mouth daily., Disp: , Rfl:  .  naratriptan (AMERGE) 2.5 MG tablet, Take 1 tablet (2.5 mg total) by mouth as needed for migraine. Take one (1) tablet at onset of headache; if returns or does not resolve, may repeat after 4 hours; do not exceed five (5) mg in 24 hours., Disp: 30 tablet, Rfl: 0 .  potassium chloride SA (KLOR-CON M20) 20 MEQ tablet, Take 1 tablet (20 mEq total) by mouth 3 (three) times daily., Disp: 270 tablet, Rfl: 1 .  pregabalin (LYRICA) 50 MG capsule, Take 1 capsule (50 mg total) by mouth 3 (three) times daily., Disp: 270 capsule, Rfl: 1 .  RABEprazole (ACIPHEX) 20 MG tablet, Take 1 tablet (20 mg total) by mouth daily., Disp: 90 tablet, Rfl: 1 .  Saccharomyces boulardii (FLORASTOR PO), Take by mouth daily., Disp: , Rfl:  .  triamterene-hydrochlorothiazide (MAXZIDE-25) 37.5-25 MG tablet, Take 1 tablet by mouth at bedtime., Disp: 90 tablet, Rfl: 1 .  valsartan (DIOVAN) 160 MG tablet, Take 1 tablet (160 mg total) by mouth at bedtime., Disp: 90 tablet, Rfl: 1 .  vitamin B-12 (CYANOCOBALAMIN) 1000 MCG tablet, Take 1,000 mcg by mouth daily., Disp: , Rfl:   EXAM:  Vitals:   05/17/18 0814  BP: 116/80  Pulse: 84  Temp: 98 F (36.7 C)  SpO2: 98%    Body mass index is 25.08 kg/m.   No hand on exam today by me as I  was just dx with the flu and pt preferred  I not examine her.  GENERAL: vitals reviewed and listed above, alert, oriented, appears well hydrated and in no acute distress  HEENT: atraumatic, conjunttiva clear, no obvious abnormalities on inspection of external nose and ears  NECK: no obvious masses on inspection  MS: moves all extremities without noticeable abnormality  PSYCH: pleasant and cooperative, no obvious depression or anxiety  ASSESSMENT AND PLAN:  Discussed the following assessment and plan:  Abnormal EKG - Plan: EKG 12-Lead  -repeat EKG now that she is better and electrolyte abnormalities resolved is normal with normal sinus  rhythm -she is relieved  There are no Patient Instructions on file for this visit.  Lucretia Kern, DO

## 2018-06-23 MED FILL — POTASSIUM CHLORIDE CRYS ER: 20 | 90 days supply | Qty: 270 | Fill #0

## 2018-07-06 MED FILL — RABEPRAZOLE SOD DR 20 MG TA: 20 | 90 days supply | Qty: 90 | Fill #0 | Status: TO

## 2018-07-06 MED FILL — AMLODIPINE 2.5 MG TABLET: 2.5 | 90 days supply | Qty: 90 | Fill #0 | Status: TO

## 2018-07-06 MED FILL — VALSARTAN 160 MG TABLET: 160 | 90 days supply | Qty: 90 | Fill #0 | Status: TO

## 2018-07-22 ENCOUNTER — Ambulatory Visit (INDEPENDENT_AMBULATORY_CARE_PROVIDER_SITE_OTHER): Payer: Self-pay | Admitting: Nurse Practitioner

## 2018-07-22 VITALS — BP 105/68 | HR 77 | Temp 97.8°F | Wt 164.0 lb

## 2018-07-22 DIAGNOSIS — J029 Acute pharyngitis, unspecified: Secondary | ICD-10-CM

## 2018-07-22 DIAGNOSIS — R6889 Other general symptoms and signs: Secondary | ICD-10-CM

## 2018-07-22 LAB — POCT INFLUENZA A/B
Influenza A, POC: NEGATIVE
Influenza B, POC: NEGATIVE

## 2018-07-22 LAB — POCT RAPID STREP A (OFFICE): Rapid Strep A Screen: NEGATIVE

## 2018-07-22 MED ORDER — PROMETHAZINE-DM 6.25-15 MG/5ML PO SYRP: 5.0000 mL | ORAL_SOLUTION | Freq: Four times a day (QID) | ORAL | 0 refills | Status: AC | PRN

## 2018-07-22 MED ORDER — BALOXAVIR MARBOXIL(40 MG DOSE) 2 X 20 MG PO TBPK: 40.0000 mg | ORAL_TABLET | Freq: Once | ORAL | 0 refills | Status: AC

## 2018-07-22 NOTE — Patient Instructions (Signed)
Influenza-Like Symptoms, Adult -Take medication as prescribed. -Take Zofran you have at home for nausea and vomiting.  Use Flonase you have at home for nasal congestion and sinus pressure, 2 sprays in each nostril daily until symptoms improve. -Ibuprofen or Tylenol for pain, fever, or general discomfort. -Increase fluids. -Get plenty of rest. -Sleep elevated on at least 2 pillows at bedtime to help with cough. -Use a humidifier or vaporizer when at home and during sleep to help with cough. -May use a teaspoon of honey or over-the-counter cough drops to help with cough and sore throat. -RTW on Monday, July 25, 2018. -Follow-up if symptoms do not improve.   Influenza, more commonly known as "the flu," is a viral infection that mainly affects the respiratory tract. The respiratory tract includes organs that help you breathe, such as the lungs, nose, and throat. The flu causes many symptoms similar to the common cold along with high fever and body aches. The flu spreads easily from person to person (is contagious). Getting a flu shot (influenza vaccination) every year is the best way to prevent the flu. What are the causes? This condition is caused by the influenza virus. You can get the virus by:  Breathing in droplets that are in the air from an infected person's cough or sneeze.  Touching something that has been exposed to the virus (has been contaminated) and then touching your mouth, nose, or eyes. What increases the risk? The following factors may make you more likely to get the flu:  Not washing or sanitizing your hands often.  Having close contact with many people during cold and flu season.  Touching your mouth, eyes, or nose without first washing or sanitizing your hands.  Not getting a yearly (annual) flu shot. You may have a higher risk for the flu, including serious problems such as a lung infection (pneumonia), if you:  Are older than 65.  Are pregnant.  Have a weakened  disease-fighting system (immune system). You may have a weakened immune system if you: ? Have HIV or AIDS. ? Are undergoing chemotherapy. ? Are taking medicines that reduce (suppress) the activity of your immune system.  Have a long-term (chronic) illness, such as heart disease, kidney disease, diabetes, or lung disease.  Have a liver disorder.  Are severely overweight (morbidly obese).  Have anemia. This is a condition that affects your red blood cells.  Have asthma. What are the signs or symptoms? Symptoms of this condition usually begin suddenly and last 4-14 days. They may include:  Fever and chills.  Headaches, body aches, or muscle aches.  Sore throat.  Cough.  Runny or stuffy (congested) nose.  Chest discomfort.  Poor appetite.  Weakness or fatigue.  Dizziness.  Nausea or vomiting. How is this diagnosed? This condition may be diagnosed based on:  Your symptoms and medical history.  A physical exam.  Swabbing your nose or throat and testing the fluid for the influenza virus. How is this treated? If the flu is diagnosed early, you can be treated with medicine that can help reduce how severe the illness is and how long it lasts (antiviral medicine). This may be given by mouth (orally) or through an IV. Taking care of yourself at home can help relieve symptoms. Your health care provider may recommend:  Taking over-the-counter medicines.  Drinking plenty of fluids. In many cases, the flu goes away on its own. If you have severe symptoms or complications, you may be treated in a hospital. Follow these  instructions at home: Activity  Rest as needed and get plenty of sleep.  Stay home from work or school as told by your health care provider. Unless you are visiting your health care provider, avoid leaving home until your fever has been gone for 24 hours without taking medicine. Eating and drinking  Take an oral rehydration solution (ORS). This is a drink  that is sold at pharmacies and retail stores.  Drink enough fluid to keep your urine pale yellow.  Drink clear fluids in small amounts as you are able. Clear fluids include water, ice chips, diluted fruit juice, and low-calorie sports drinks.  Eat bland, easy-to-digest foods in small amounts as you are able. These foods include bananas, applesauce, rice, lean meats, toast, and crackers.  Avoid drinking fluids that contain a lot of sugar or caffeine, such as energy drinks, regular sports drinks, and soda.  Avoid alcohol.  Avoid spicy or fatty foods. General instructions      Take over-the-counter and prescription medicines only as told by your health care provider.  Use a cool mist humidifier to add humidity to the air in your home. This can make it easier to breathe.  Cover your mouth and nose when you cough or sneeze.  Wash your hands with soap and water often, especially after you cough or sneeze. If soap and water are not available, use alcohol-based hand sanitizer.  Keep all follow-up visits as told by your health care provider. This is important. How is this prevented?   Get an annual flu shot. You may get the flu shot in late summer, fall, or winter. Ask your health care provider when you should get your flu shot.  Avoid contact with people who are sick during cold and flu season. This is generally fall and winter. Contact a health care provider if:  You develop new symptoms.  You have: ? Chest pain. ? Diarrhea. ? A fever.  Your cough gets worse.  You produce more mucus.  You feel nauseous or you vomit. Get help right away if:  You develop shortness of breath or difficulty breathing.  Your skin or nails turn a bluish color.  You have severe pain or stiffness in your neck.  You develop a sudden headache or sudden pain in your face or ear.  You cannot eat or drink without vomiting. Summary  Influenza, more commonly known as "the flu," is a viral  infection that primarily affects your respiratory tract.  Symptoms of the flu usually begin suddenly and last 4-14 days.  Getting an annual flu shot is the best way to prevent getting the flu.  Stay home from work or school as told by your health care provider. Unless you are visiting your health care provider, avoid leaving home until your fever has been gone for 24 hours without taking medicine.  Keep all follow-up visits as told by your health care provider. This is important. This information is not intended to replace advice given to you by your health care provider. Make sure you discuss any questions you have with your health care provider. Document Released: 05/08/2000 Document Revised: 10/27/2017 Document Reviewed: 10/27/2017 Elsevier Interactive Patient Education  2019 [employer].

## 2018-07-22 NOTE — Progress Notes (Signed)
poc

## 2018-07-22 NOTE — Progress Notes (Addendum)
MRN: [id number] DOB: October 29, 1965  Subjective:   Denise Campos is a 53 y.o. female presenting for chief complaint of Fever (X 1 DAY); Emesis (X 1 DAY); Headache (X 1 DAY ); and Fatigue (X 1 DAY ) .  Reports 1 day history of fever, sinus headache, sinus congestion , sinus pain, dry cough and chest tightness, fatigue, decreased appetite, nausea and vomiting, scratchy throat.  Patient informed she began vomiting last night, and vomited "several" times.  Patient states she vomited about 4 times today with the last episode of vomiting around 2 PM.  Patient informs she has been able to keep down ibuprofen and Tylenol.  Has tried Tylenol and Motrin for relief. Denies ear fullness, ear drainage, difficulty swallowing, pain with swallowing, inability to swallow, productive cough and wheezing, night sweats, abdominal pain and diarrhea. Has not had sick contact with influenza. Admits to history of seasonal allergies, denies history of asthma. Patient did have flu shot this season. Denies smoking. Denies any other aggravating or relieving factors, no other questions or concerns.  The patient informs that she would like to be tested for both strep and influenza.  Informed patient that I could diagnose her clinically, but patient diagnosed.  Review of Systems  Constitutional: Positive for chills, fever and malaise/fatigue.  HENT: Positive for congestion and sinus pain.   Eyes: Negative.   Respiratory: Positive for cough.   Cardiovascular: Negative.   Gastrointestinal: Positive for nausea and vomiting.  Skin: Negative.   Neurological: Positive for headaches.  Endo/Heme/Allergies: Positive for environmental allergies.    Denise Campos has a current medication list which includes the following prescription(s): amlodipine, vitamin d3, cyclobenzaprine, epinephrine, fexofenadine, fluticasone, ibuprofen, magnesium, potassium chloride sa, pregabalin, rabeprazole, saccharomyces boulardii, triamterene-hydrochlorothiazide,  valsartan, vitamin b-12, and naratriptan. Also is allergic to bee venom.  Denise Campos  has a past medical history of Anemia (06/21/2017), B12 deficiency (06/10/2017), Cancer ([place]), Endometriosis, Essential hypertension (06/10/2017), GERD (gastroesophageal reflux disease), History of stomach ulcers, Hypertension, Migraines, Other chronic pain (06/10/2017), and UTI (urinary tract infection). Also  has a past surgical history that includes Appendectomy and Tonsillectomy and adenoidectomy.   Objective:   Vitals: BP 105/68 (BP Location: Right Arm, Patient Position: Sitting, Cuff Size: Normal)   Pulse 77   Temp 97.8 F (36.6 C) (Oral)   Wt 164 lb (74.4 kg)   SpO2 99%   BMI 25.69 kg/m   Physical Exam Vitals signs reviewed.  Constitutional:      General: She is not in acute distress.    Comments: Appears fatigued  HENT:     Head: Normocephalic.     Right Ear: Tympanic membrane, ear canal and external ear normal.     Left Ear: Tympanic membrane, ear canal and external ear normal.     Nose: Mucosal edema, congestion (moderate) and rhinorrhea (clear drainage) present.     Right Turbinates: Enlarged and swollen.     Left Turbinates: Enlarged and swollen.     Right Sinus: Maxillary sinus tenderness (moderate) and frontal sinus tenderness present.     Left Sinus: Maxillary sinus tenderness ( moderate) and frontal sinus tenderness present.     Mouth/Throat:     Lips: Pink.     Mouth: Mucous membranes are moist.     Pharynx: Uvula midline. Posterior oropharyngeal erythema present. No pharyngeal swelling, oropharyngeal exudate or uvula swelling.     Tonsils: No tonsillar exudate. Swelling: 0 on the right. 0 on the left.  Neck:     Musculoskeletal: Normal range of motion and  neck supple.  Cardiovascular:     Rate and Rhythm: Normal rate and regular rhythm.     Heart sounds: Normal heart sounds.  Pulmonary:     Effort: Pulmonary effort is normal.     Breath sounds: Normal breath sounds.  Abdominal:      General: Bowel sounds are normal.     Palpations: Abdomen is soft.     Tenderness: There is no abdominal tenderness.  Lymphadenopathy:     Cervical: No cervical adenopathy.  Skin:    General: Skin is warm and dry.  Neurological:     Mental Status: She is alert.     Cranial Nerves: No cranial nerve deficit.     No results found for this or any previous visit (from the past 24 hour(s)).  Centor Score Tonsillar Exudate: 0 Fever/Hx of fever >39 Degrees Celsius: +1 Tender anterior cervical lymphadenopathy: 0 Age > 45: -1 Total: 0  Assessment and Plan :   Exam findings, diagnosis etiology and medication use and indications reviewed with patient. Follow- Up and discharge instructions provided. No emergent/urgent issues found on exam.  The patient's presentation was most consistent with influenza.  I discussed with the patient that I could diagnose her clinically, but she felt she needed a flu and strep test.  Both test resulted negative. Also I feel an antiviral would serve this patient best due to her significant medical history to include CA.  Based on the patient's recent nausea and vomiting, I discussed with her that Marlis Edelson may be a better choice versus Tamiflu, as it will have less side effects.  Patient was in agreement with this treatment plan.  I will also provide symptomatic treatment for the patient's cough.  Patient informs that she does have Zofran ODT at home along with Flonase.  Instructed the patient to use those medications as directed until her symptoms improve.  Instructed patient to increase fluids, get plenty of rest, and to remain home until she has been fever free for at least 48 hours.  Despite patient's recent vomiting, she does not display any signs of dehydration at this time.  Patient's blood pressure is normal, mucous membranes are pink and moist.  The patient appears fatigued, but is well-appearing, in no acute distress, and vital signs are stable at this time.   However, still feel patient does need to be treated with an antiviral based on her symptoms.  Patient education was provided. Patient verbalized understanding of information provided and agrees with plan of care (POC), all questions answered. The patient is advised to call or return to clinic if condition does not see an improvement in symptoms, or to seek the care of the closest emergency department if condition worsens with the above plan.   1. Flu-like symptoms  - POCT Influenza A/B  2. Sore throat  - POCT rapid strep A  3. Influenza-like symptoms  - Baloxavir Marboxil,40 MG Dose, (XOFLUZA) 2 x 20 MG TBPK; Take 40 mg by mouth once for 1 dose.  Dispense: 1 each; Refill: 0 - promethazine-dextromethorphan (PROMETHAZINE-DM) 6.25-15 MG/5ML syrup; Take 5 mLs by mouth 4 (four) times daily as needed for up to 7 days.  Dispense: 140 mL; Refill: 0 -Take medication as prescribed. -Take Zofran you have at home for nausea and vomiting.  Use Flonase you have at home for nasal congestion and sinus pressure, 2 sprays in each nostril daily until symptoms improve. -Ibuprofen or Tylenol for pain, fever, or general discomfort. -Increase fluids. -Get plenty of rest. -  Sleep elevated on at least 2 pillows at bedtime to help with cough. -Use a humidifier or vaporizer when at home and during sleep to help with cough. -May use a teaspoon of honey or over-the-counter cough drops to help with cough and sore throat. -RTW on Monday, July 25, 2018. -Follow-up if symptoms do not improve.

## 2018-07-25 ENCOUNTER — Telehealth: Payer: Self-pay

## 2018-07-25 NOTE — Telephone Encounter (Signed)
Patient did not answered the phone, I left a message asking to call us back.  

## 2018-07-26 MED FILL — TRIAMTERENE/HCTZ 37.5/25 TB: 37.5-25 | 90 days supply | Qty: 90 | Fill #0 | Status: TO

## 2018-07-26 MED FILL — FLUTICASONE PROP 50 MCG SPR: 50 | 60 days supply | Qty: 16 | Fill #1

## 2018-08-05 ENCOUNTER — Ambulatory Visit (INDEPENDENT_AMBULATORY_CARE_PROVIDER_SITE_OTHER): Payer: Self-pay | Admitting: Nurse Practitioner

## 2018-08-05 VITALS — BP 100/70 | HR 86 | Temp 98.4°F | Resp 16 | Wt 165.8 lb

## 2018-08-05 DIAGNOSIS — H6983 Other specified disorders of Eustachian tube, bilateral: Secondary | ICD-10-CM

## 2018-08-05 DIAGNOSIS — H6993 Unspecified Eustachian tube disorder, bilateral: Secondary | ICD-10-CM

## 2018-08-05 MED ORDER — AZELASTINE HCL 0.1 % NA SOLN: 2.0000 | Freq: Two times a day (BID) | NASAL | 0 refills | Status: DC

## 2018-08-05 MED ORDER — PREDNISONE 10 MG (21) PO TBPK: ORAL_TABLET | ORAL | 0 refills | Status: DC

## 2018-08-05 NOTE — Patient Instructions (Signed)
Eustachian Tube Dysfunction -Take medication as prescribed. -Continue Ibuprofen for pain and discomfort. -Warm compresses to the ears as needed for comfort. -Continue the Allegra you are currently taking to help with your allergy symptoms. -Continue techniques to relieve the pressure in your ears such as chewing gum, yawning, etc. -Normal saline nasal spray to help prevent bacterial sinusitis. -As discussed, if you do not experience relief with this medication regimen, I would like you to follow up with ENT. -Follow up in our office as needed..  Eustachian tube dysfunction refers to a condition in which a blockage develops in the narrow passage that connects the middle ear to the back of the nose (eustachian tube). The eustachian tube regulates air pressure in the middle ear by letting air move between the ear and nose. It also helps to drain fluid from the middle ear space. Eustachian tube dysfunction can affect one or both ears. When the eustachian tube does not function properly, air pressure, fluid, or both can build up in the middle ear. What are the causes? This condition occurs when the eustachian tube becomes blocked or cannot open normally. Common causes of this condition include:  Ear infections.  Colds and other infections that affect the nose, mouth, and throat (upper respiratory tract).  Allergies.  Irritation from cigarette smoke.  Irritation from stomach acid coming up into the esophagus (gastroesophageal reflux). The esophagus is the tube that carries food from the mouth to the stomach.  Sudden changes in air pressure, such as from descending in an airplane or scuba diving.  Abnormal growths in the nose or throat, such as: ? Growths that line the nose (nasal polyps). ? Abnormal growth of cells (tumors). ? Enlarged tissue at the back of the throat (adenoids). What increases the risk? You are more likely to develop this condition if:  You smoke.  You are overweight.   You are a child who has: ? Certain birth defects of the mouth, such as cleft palate. ? Large tonsils or adenoids. What are the signs or symptoms? Common symptoms of this condition include:  A feeling of fullness in the ear.  Ear pain.  Clicking or popping noises in the ear.  Ringing in the ear.  Hearing loss.  Loss of balance.  Dizziness. Symptoms may get worse when the air pressure around you changes, such as when you travel to an area of high elevation, fly on an airplane, or go scuba diving. How is this diagnosed? This condition may be diagnosed based on:  Your symptoms.  A physical exam of your ears, nose, and throat.  Tests, such as those that measure: ? The movement of your eardrum (tympanogram). ? Your hearing (audiometry). How is this treated? Treatment depends on the cause and severity of your condition.  In mild cases, you may relieve your symptoms by moving air into your ears. This is called "popping the ears."  In more severe cases, or if you have symptoms of fluid in your ears, treatment may include: ? Medicines to relieve congestion (decongestants). ? Medicines that treat allergies (antihistamines). ? Nasal sprays or ear drops that contain medicines that reduce swelling (steroids). ? A procedure to drain the fluid in your eardrum (myringotomy). In this procedure, a small tube is placed in the eardrum to:  Drain the fluid.  Restore the air in the middle ear space. ? A procedure to insert a balloon device through the nose to inflate the opening of the eustachian tube (balloon dilation). Follow these instructions at  home: Lifestyle  Do not do any of the following until your health care provider approves: ? Travel to high altitudes. ? Fly in airplanes. ? Work in a [employer] scheme manager or room. ? Scuba dive.  Do not use any products that contain nicotine or tobacco, such as cigarettes and e-cigarettes. If you need help quitting, ask your health care  provider.  Keep your ears dry. Wear fitted earplugs during showering and bathing. Dry your ears completely after. General instructions  Take over-the-counter and prescription medicines only as told by your health care provider.  Use techniques to help pop your ears as recommended by your health care provider. These may include: ? Chewing gum. ? Yawning. ? Frequent, forceful swallowing. ? Closing your mouth, holding your nose closed, and gently blowing as if you are trying to blow air out of your nose.  Keep all follow-up visits as told by your health care provider. This is important. Contact a health care provider if:  Your symptoms do not go away after treatment.  Your symptoms come back after treatment.  You are unable to pop your ears.  You have: ? A fever. ? Pain in your ear. ? Pain in your head or neck. ? Fluid draining from your ear.  Your hearing suddenly changes.  You become very dizzy.  You lose your balance. Summary  Eustachian tube dysfunction refers to a condition in which a blockage develops in the eustachian tube.  It can be caused by ear infections, allergies, inhaled irritants, or abnormal growths in the nose or throat.  Symptoms include ear pain, hearing loss, or ringing in the ears.  Mild cases are treated with maneuvers to unblock the ears, such as yawning or ear popping.  Severe cases are treated with medicines. Surgery may also be done (rare). This information is not intended to replace advice given to you by your health care provider. Make sure you discuss any questions you have with your health care provider. Document Released: 06/07/2015 Document Revised: 08/31/2017 Document Reviewed: 08/31/2017 Elsevier Interactive Patient Education  2019 [employer] Allergic Rhinitis, Adult Allergic rhinitis is an allergic reaction that affects the mucous membrane inside the nose. It causes sneezing, a runny or stuffy nose, and the feeling of mucus going  down the back of the throat (postnasal drip). Allergic rhinitis can be mild to severe. There are two types of allergic rhinitis:  Seasonal. This type is also called hay fever. It happens only during certain seasons.  Perennial. This type can happen at any time of the year. What are the causes? This condition happens when the body's defense system (immune system) responds to certain harmless substances called allergens as though they were germs.  Seasonal allergic rhinitis is triggered by pollen, which can come from grasses, trees, and weeds. Perennial allergic rhinitis may be caused by:  House dust mites.  Pet dander.  Mold spores. What are the signs or symptoms? Symptoms of this condition include:  Sneezing.  Runny or stuffy nose (nasal congestion).  Postnasal drip.  Itchy nose.  Tearing of the eyes.  Trouble sleeping.  Daytime sleepiness. How is this diagnosed? This condition may be diagnosed based on:  Your medical history.  A physical exam.  Tests to check for related conditions, such as: ? Asthma. ? Pink eye. ? Ear infection. ? Upper respiratory infection.  Tests to find out which allergens trigger your symptoms. These may include skin or blood tests. How is this treated? There is no cure for this condition,  but treatment can help control symptoms. Treatment may include:  Taking medicines that block allergy symptoms, such as antihistamines. Medicine may be given as a shot, nasal spray, or pill.  Avoiding the allergen.  Desensitization. This treatment involves getting ongoing shots until your body becomes less sensitive to the allergen. This treatment may be done if other treatments do not help.  If taking medicine and avoiding the allergen does not work, new, stronger medicines may be prescribed. Follow these instructions at home:  Find out what you are allergic to. Common allergens include smoke, dust, and pollen.  Avoid the things you are allergic to.  These are some things you can do to help avoid allergens: ? Replace carpet with wood, tile, or vinyl flooring. Carpet can trap dander and dust. ? Do not smoke. Do not allow smoking in your home. ? Change your heating and air conditioning filter at least once a month. ? During allergy season:  Keep windows closed as much as possible.  Plan outdoor activities when pollen counts are lowest. This is usually during the evening hours.  When coming indoors, change clothing and shower before sitting on furniture or bedding.  Take over-the-counter and prescription medicines only as told by your health care provider.  Keep all follow-up visits as told by your health care provider. This is important. Contact a health care provider if:  You have a fever.  You develop a persistent cough.  You make whistling sounds when you breathe (you wheeze).  Your symptoms interfere with your normal daily activities. Get help right away if:  You have shortness of breath. Summary  This condition can be managed by taking medicines as directed and avoiding allergens.  Contact your health care provider if you develop a persistent cough or fever.  During allergy season, keep windows closed as much as possible. This information is not intended to replace advice given to you by your health care provider. Make sure you discuss any questions you have with your health care provider. Document Released: 02/03/2001 Document Revised: 06/18/2016 Document Reviewed: 06/18/2016 Elsevier Interactive Patient Education  2019 [employer].

## 2018-08-05 NOTE — Progress Notes (Signed)
Subjective:    Patient ID: Denise Campos, female    DOB: November 19, 1965, 53 y.o.   MRN: [id number]  The patient is a 53 y.o. female who presents with complaints of bilateral ear pain and pressure for 5 days.    Otalgia   There is pain in both ears. This is a new problem. The current episode started in the past 7 days. The problem occurs constantly. The problem has been gradually worsening. There has been no fever. The fever has been present for less than 1 day. The pain is at a severity of 7/10. The pain is moderate. Pertinent negatives include no ear discharge, hearing loss, rash, rhinorrhea or sore throat. Associated symptoms comments: Muffled hearing, "wooshing noises in bilateral ears", worse on right. Treatments tried: Ibuprofen, Allegra, Flonase. The treatment provided mild relief. There is no history of a chronic ear infection.   Past Medical History:  Diagnosis Date  . Anemia 06/21/2017   -patient reports chronic, normal for her  . B12 deficiency 06/10/2017  . Cancer ([place])   . Endometriosis   . Essential hypertension 06/10/2017  . GERD (gastroesophageal reflux disease)   . History of stomach ulcers   . Hypertension   . Migraines   . Other chronic pain 06/10/2017  . UTI (urinary tract infection)     Current Outpatient Medications:  .  amLODipine (NORVASC) 2.5 MG tablet, Take 1 tablet (2.5 mg total) by mouth daily., Disp: 90 tablet, Rfl: 1 .  Cholecalciferol (VITAMIN D3) 1000 units CAPS, Take 5,000 Units by mouth daily. , Disp: , Rfl:  .  fexofenadine (ALLEGRA) 180 MG tablet, Take 1 tablet (180 mg total) by mouth daily., Disp: 90 tablet, Rfl: 1 .  fluticasone (FLONASE) 50 MCG/ACT nasal spray, Place 1 spray into both nostrils daily., Disp: 48 g, Rfl: 1 .  ibuprofen (ADVIL,MOTRIN) 800 MG tablet, Take 1 tablet (800 mg total) by mouth every 8 (eight) hours as needed., Disp: 270 tablet, Rfl: 0 .  Magnesium 500 MG TABS, Take 500 mg by mouth daily., Disp: , Rfl:  .  potassium chloride SA  (KLOR-CON M20) 20 MEQ tablet, Take 1 tablet (20 mEq total) by mouth 3 (three) times daily., Disp: 270 tablet, Rfl: 1 .  pregabalin (LYRICA) 50 MG capsule, Take 1 capsule (50 mg total) by mouth 3 (three) times daily., Disp: 270 capsule, Rfl: 1 .  RABEprazole (ACIPHEX) 20 MG tablet, Take 1 tablet (20 mg total) by mouth daily., Disp: 90 tablet, Rfl: 1 .  Saccharomyces boulardii (FLORASTOR PO), Take by mouth daily., Disp: , Rfl:  .  triamterene-hydrochlorothiazide (MAXZIDE-25) 37.5-25 MG tablet, Take 1 tablet by mouth at bedtime., Disp: 90 tablet, Rfl: 1 .  valsartan (DIOVAN) 160 MG tablet, Take 1 tablet (160 mg total) by mouth at bedtime., Disp: 90 tablet, Rfl: 1 .  vitamin B-12 (CYANOCOBALAMIN) 1000 MCG tablet, Take 1,000 mcg by mouth daily., Disp: , Rfl:  .  azelastine (ASTELIN) 0.1 % nasal spray, Place 2 sprays into both nostrils 2 (two) times daily for 10 days. Use in each nostril as directed, Disp: 30 mL, Rfl: 0 .  cyclobenzaprine (FLEXERIL) 10 MG tablet, Take 1 tablet (10 mg total) by mouth 3 (three) times daily as needed for muscle spasms., Disp: 90 tablet, Rfl: 1 .  EPINEPHrine (EPIPEN IJ), Inject as directed., Disp: , Rfl:  .  naratriptan (AMERGE) 2.5 MG tablet, Take 1 tablet (2.5 mg total) by mouth as needed for migraine. Take one (1) tablet at onset of headache;  if returns or does not resolve, may repeat after 4 hours; do not exceed five (5) mg in 24 hours. (Patient not taking: Reported on 08/05/2018), Disp: 30 tablet, Rfl: 0 .  predniSONE (STERAPRED UNI-PAK 21 TAB) 10 MG (21) TBPK tablet, Take as directed., Disp: 21 tablet, Rfl: 0   Review of Systems  Constitutional: Positive for fatigue and fever (low-grade).  HENT: Positive for ear pain, sinus pressure and sinus pain. Negative for ear discharge, hearing loss, postnasal drip, rhinorrhea and sore throat.   Eyes: Negative.   Respiratory: Negative.   Cardiovascular: Negative.   Gastrointestinal: Negative.   Skin: Negative for rash.   Allergic/Immunologic: Positive for environmental allergies.  Neurological: Negative.       Objective: Blood pressure 100/70, pulse 86, temperature 98.4 F (36.9 C), resp. rate 16, weight 165 lb 12.8 oz (75.2 kg), SpO2 99 %.   Physical Exam Vitals signs reviewed.  Constitutional:      General: She is not in acute distress.    Comments: Appears uncomfortable due to ear pain  HENT:     Head: Normocephalic.     Right Ear: Ear canal and external ear normal. A middle ear effusion is present.     Left Ear: Ear canal and external ear normal. A middle ear effusion is present.     Nose: Mucosal edema present. No congestion or rhinorrhea.     Right Turbinates: Enlarged and swollen.     Left Turbinates: Enlarged and swollen.     Right Sinus: Maxillary sinus tenderness and frontal sinus tenderness present.     Left Sinus: Maxillary sinus tenderness and frontal sinus tenderness present.     Mouth/Throat:     Lips: Pink.     Mouth: Mucous membranes are moist.     Pharynx: Oropharynx is clear. Uvula midline. No pharyngeal swelling, oropharyngeal exudate, posterior oropharyngeal erythema or uvula swelling.     Tonsils: No tonsillar exudate. Swelling: 0 on the right. 0 on the left.  Eyes:     Conjunctiva/sclera: Conjunctivae normal.     Pupils: Pupils are equal, round, and reactive to light.  Neck:     Musculoskeletal: Normal range of motion and neck supple.  Cardiovascular:     Rate and Rhythm: Normal rate and regular rhythm.     Pulses: Normal pulses.     Heart sounds: Normal heart sounds.  Pulmonary:     Effort: Pulmonary effort is normal. No respiratory distress.     Breath sounds: Normal breath sounds. No stridor. No wheezing, rhonchi or rales.  Abdominal:     General: Bowel sounds are normal.     Palpations: Abdomen is soft.     Tenderness: There is no abdominal tenderness.  Lymphadenopathy:     Cervical: No cervical adenopathy.  Skin:    General: Skin is warm and dry.      Capillary Refill: Capillary refill takes less than 2 seconds.  Neurological:     General: No focal deficit present.     Mental Status: She is alert and oriented to person, place, and time.  Psychiatric:        Mood and Affect: Mood normal.        Behavior: Behavior normal.       Assessment & Plan:   Exam findings, diagnosis etiology and medication use and indications reviewed with patient. Follow- Up and discharge instructions provided. No emergent/urgent issues found on exam. The patient has bilateral middle ear effusions that are consistent with her allergic rhinitis  symptoms.  The patient reports a short-lived low-grade temp and sinus pressure, but I do not feel she has developed a bacterial sinus infection at this time in the absence of purulent nasal drainage, nasal congestion/obstruction and facial pain.  I am going to prescribe a prednisone tape for the patient as she has been attempting to treat her allergy symptoms appropriately to date with little relief.  I am going to prescribe Astelin nasal spray and have her stop the Flonase while she is taking the steroids. This will provide continued relief for her allergic rhinitis symptoms. The patient was instructed that if her ear symptoms do not improve, she should follow up with ENT next.  I did inform patient to monitor her sinus symptoms for worsening.  Patient is well-appearing, is in no acute distress and vital signs are stable. Patient education was provided. Patient verbalized understanding of information provided and agrees with plan of care (POC), all questions answered. The patient is advised to call or return to clinic if condition does not see an improvement in symptoms, or to seek the care of the closest emergency department if condition worsens with the above plan.    1. Acute dysfunction of Eustachian tube, bilateral   - predniSONE (STERAPRED UNI-PAK 21 TAB) 10 MG (21) TBPK tablet; Take as directed.  Dispense: 21 tablet; Refill:  0 - azelastine (ASTELIN) 0.1 % nasal spray; Place 2 sprays into both nostrils 2 (two) times daily for 10 days. Use in each nostril as directed  Dispense: 30 mL; Refill: 0 -Take medication as prescribed. -Continue Ibuprofen for pain and discomfort. -Warm compresses to the ears as needed for comfort. -Continue the Allegra you are currently taking to help with your allergy symptoms. -Continue techniques to relieve the pressure in your ears such as chewing gum, yawning, etc. -Normal saline nasal spray to help prevent bacterial sinusitis. -As discussed, if you do not experience relief with this medication regimen, I would like you to follow up with ENT. -Follow up in our office as needed.Marland Kitchen

## 2018-08-08 ENCOUNTER — Other Ambulatory Visit: Payer: Self-pay | Admitting: Nurse Practitioner

## 2018-08-08 ENCOUNTER — Telehealth: Payer: Self-pay

## 2018-08-08 MED ORDER — DOXYCYCLINE HYCLATE 100 MG PO TABS: 100.0000 mg | ORAL_TABLET | Freq: Two times a day (BID) | ORAL | 0 refills | Status: AC

## 2018-08-08 NOTE — Progress Notes (Signed)
The patient recall Denise Campos phone call informing that her symptoms had not improved with the symptomatic treatment recommended.  Patient states she is a fever of 101.7 and has purulent nasal drainage at this time.  Patient is requesting an antibiotic be called in.  Informed patient that we would call an antibiotic in at this time; however, inform patient that because this appears to be a recurrent condition for her, it may be best for her to follow-up with ENT if her symptoms persist. Will call in Doxycycline 100mg  twice daily for 7 days.  Patient verbalizes understanding at this time.

## 2018-08-08 NOTE — Telephone Encounter (Signed)
Patient did not answered the phone, I left a message asking to call us back.  

## 2018-09-13 ENCOUNTER — Telehealth: Payer: Self-pay | Admitting: Family Medicine

## 2018-09-13 ENCOUNTER — Ambulatory Visit: Payer: 59 | Admitting: Family Medicine

## 2018-09-13 ENCOUNTER — Other Ambulatory Visit: Payer: Self-pay

## 2018-09-13 ENCOUNTER — Encounter: Payer: Self-pay | Admitting: Family Medicine

## 2018-09-13 NOTE — Telephone Encounter (Signed)
Copied from [place] [phone]. Topic: General - Other >> Sep 13, 2018  4:20 PM Pauline Good wrote: Reason for CRM pt has an virtual appt at 4:30 and can't open up her text. Pt stated she's  having trouble with her browser

## 2018-09-13 NOTE — Progress Notes (Signed)
Tried to connect via telemedicine platform and called pt several times before, at and after appointment time. Pt was not in the platform and did not pick up  - so left message for her to call our office so that we can help her get set up with a different time if needed.

## 2018-09-15 ENCOUNTER — Ambulatory Visit (INDEPENDENT_AMBULATORY_CARE_PROVIDER_SITE_OTHER): Payer: 59 | Admitting: Family Medicine

## 2018-09-15 ENCOUNTER — Other Ambulatory Visit: Payer: Self-pay

## 2018-09-15 ENCOUNTER — Ambulatory Visit (INDEPENDENT_AMBULATORY_CARE_PROVIDER_SITE_OTHER): Payer: Self-pay | Admitting: Nurse Practitioner

## 2018-09-15 ENCOUNTER — Encounter: Payer: Self-pay | Admitting: Family Medicine

## 2018-09-15 VITALS — BP 110/68 | HR 85 | Temp 98.5°F | Resp 18 | Wt 166.0 lb

## 2018-09-15 VITALS — BP 100/68 | HR 85 | Temp 98.5°F | Resp 18 | Wt 166.0 lb

## 2018-09-15 DIAGNOSIS — I10 Essential (primary) hypertension: Secondary | ICD-10-CM | POA: Diagnosis not present

## 2018-09-15 DIAGNOSIS — G8929 Other chronic pain: Secondary | ICD-10-CM | POA: Diagnosis not present

## 2018-09-15 DIAGNOSIS — E538 Deficiency of other specified B group vitamins: Secondary | ICD-10-CM | POA: Diagnosis not present

## 2018-09-15 DIAGNOSIS — J029 Acute pharyngitis, unspecified: Secondary | ICD-10-CM

## 2018-09-15 DIAGNOSIS — H6983 Other specified disorders of Eustachian tube, bilateral: Secondary | ICD-10-CM

## 2018-09-15 DIAGNOSIS — R0981 Nasal congestion: Secondary | ICD-10-CM

## 2018-09-15 DIAGNOSIS — J040 Acute laryngitis: Secondary | ICD-10-CM | POA: Diagnosis not present

## 2018-09-15 DIAGNOSIS — J329 Chronic sinusitis, unspecified: Secondary | ICD-10-CM

## 2018-09-15 DIAGNOSIS — B9789 Other viral agents as the cause of diseases classified elsewhere: Secondary | ICD-10-CM

## 2018-09-15 LAB — POCT RAPID STREP A (OFFICE): Rapid Strep A Screen: NEGATIVE

## 2018-09-15 MED ORDER — PROMETHAZINE-DM 6.25-15 MG/5ML PO SYRP: 5.0000 mL | ORAL_SOLUTION | Freq: Four times a day (QID) | ORAL | 0 refills | Status: AC | PRN

## 2018-09-15 MED ORDER — CETIRIZINE HCL 10 MG PO TABS: 10.0000 mg | ORAL_TABLET | Freq: Every day | ORAL | 11 refills | Status: AC

## 2018-09-15 NOTE — Patient Instructions (Addendum)
Sinusitis, Adult      -Take medication as prescribed.  I would like you to continue using Flonase as directed. -Continue Ibuprofen or Tylenol for pain, fever or general discomfort. -Increase fluids. -Get plenty of rest.  -Continue normal saline use as needed for nasal congestion. -Use a humidifier when at home and during sleep to help with nasal congestion. -Warm saltwater gargles 3-4 times daily to help with throat discomfort.  May also use a teaspoon of honey as needed. -I would like you to follow up with your PCP as scheduled for next week.  Please discuss a referral for ENT for recurrent sinusitis. -You will need to stay at home until you have been fever free for at least 72 hours without the use of antipyretics.  Stay in contact with [facility].       Sinusitis is inflammation of your sinuses. Sinuses are hollow spaces in the bones around your face. Your sinuses are located:  Around your eyes.  In the middle of your forehead.  Behind your nose.  In your cheekbones. Mucus normally drains out of your sinuses. When your nasal tissues become inflamed or swollen, mucus can become trapped or blocked. This allows bacteria, viruses, and fungi to grow, which leads to infection. Most infections of the sinuses are caused by a virus. Sinusitis can develop quickly. It can last for up to 4 weeks (acute) or for more than 12 weeks (chronic). Sinusitis often develops after a cold. What are the causes? This condition is caused by anything that creates swelling in the sinuses or stops mucus from draining. This includes:  Allergies.  Asthma.  Infection from bacteria or viruses.  Deformities or blockages in your nose or sinuses.  Abnormal growths in the nose (nasal polyps).  Pollutants, such as chemicals or irritants in the air.  Infection from fungi (rare). What increases the risk? You are more likely to develop this condition if you:  Have a weak body defense system (immune  system).  Do a lot of swimming or diving.  Overuse nasal sprays.  Smoke. What are the signs or symptoms? The main symptoms of this condition are pain and a feeling of pressure around the affected sinuses. Other symptoms include:  Stuffy nose or congestion.  Thick drainage from your nose.  Swelling and warmth over the affected sinuses.  Headache.  Upper toothache.  A cough that may get worse at night.  Extra mucus that collects in the throat or the back of the nose (postnasal drip).  Decreased sense of smell and taste.  Fatigue.  A fever.  Sore throat.  Bad breath. How is this diagnosed? This condition is diagnosed based on:  Your symptoms.  Your medical history.  A physical exam.  Tests to find out if your condition is acute or chronic. This may include: ? Checking your nose for nasal polyps. ? Viewing your sinuses using a device that has a light (endoscope). ? Testing for allergies or bacteria. ? Imaging tests, such as an MRI or CT scan. In rare cases, a bone biopsy may be done to rule out more serious types of fungal sinus disease. How is this treated? Treatment for sinusitis depends on the cause and whether your condition is chronic or acute.  If caused by a virus, your symptoms should go away on their own within 10 days. You may be given medicines to relieve symptoms. They include: ? Medicines that shrink swollen nasal passages (topical intranasal decongestants). ? Medicines that treat allergies (  antihistamines). ? A spray that eases inflammation of the nostrils (topical intranasal corticosteroids). ? Rinses that help get rid of thick mucus in your nose (nasal saline washes).  If caused by bacteria, your health care provider may recommend waiting to see if your symptoms improve. Most bacterial infections will get better without antibiotic medicine. You may be given antibiotics if you have: ? A severe infection. ? A weak immune system.  If caused by  narrow nasal passages or nasal polyps, you may need to have surgery. Follow these instructions at home: Medicines  Take, use, or apply over-the-counter and prescription medicines only as told by your health care provider. These may include nasal sprays.  If you were prescribed an antibiotic medicine, take it as told by your health care provider. Do not stop taking the antibiotic even if you start to feel better. Hydrate and humidify   Drink enough fluid to keep your urine pale yellow. Staying hydrated will help to thin your mucus.  Use a cool mist humidifier to keep the humidity level in your home above 50%.  Inhale steam for 10-15 minutes, 3-4 times a day, or as told by your health care provider. You can do this in the bathroom while a hot shower is running.  Limit your exposure to cool or dry air. Rest  Rest as much as possible.  Sleep with your head raised (elevated).  Make sure you get enough sleep each night. General instructions   Apply a warm, moist washcloth to your face 3-4 times a day or as told by your health care provider. This will help with discomfort.  Wash your hands often with soap and water to reduce your exposure to germs. If soap and water are not available, use hand sanitizer.  Do not smoke. Avoid being around people who are smoking (secondhand smoke).  Keep all follow-up visits as told by your health care provider. This is important. Contact a health care provider if:  You have a fever.  Your symptoms get worse.  Your symptoms do not improve within 10 days. Get help right away if:  You have a severe headache.  You have persistent vomiting.  You have severe pain or swelling around your face or eyes.  You have vision problems.  You develop confusion.  Your neck is stiff.  You have trouble breathing. Summary  Sinusitis is soreness and inflammation of your sinuses. Sinuses are hollow spaces in the bones around your face.  This condition is  caused by nasal tissues that become inflamed or swollen. The swelling traps or blocks the flow of mucus. This allows bacteria, viruses, and fungi to grow, which leads to infection.  If you were prescribed an antibiotic medicine, take it as told by your health care provider. Do not stop taking the antibiotic even if you start to feel better.  Keep all follow-up visits as told by your health care provider. This is important. This information is not intended to replace advice given to you by your health care provider. Make sure you discuss any questions you have with your health care provider. Document Released: 05/11/2005 Document Revised: 10/11/2017 Document Reviewed: 10/11/2017 Elsevier Interactive Patient Education  2019 [employer]  Postnasal Drip Postnasal drip is the feeling of mucus going down the back of your throat. Mucus is a slimy substance that moistens and cleans your nose and throat, as well as the air pockets in face bones near your forehead and cheeks (sinuses). Small amounts of mucus pass from your  nose and sinuses down the back of your throat all the time. This is normal. When you produce too much mucus or the mucus gets too thick, you can feel it. Some common causes of postnasal drip include:  Having more mucus because of: ? A cold or the flu. ? Allergies. ? Cold air. ? Certain medicines.  Having more mucus that is thicker because of: ? A sinus or nasal infection. ? Dry air. ? A food allergy. Follow these instructions at home: Relieving discomfort   Gargle with a salt-water mixture 3-4 times a day or as needed. To make a salt-water mixture, completely dissolve -1 tsp of salt in 1 cup of warm water.  If the air in your home is dry, use a humidifier to add moisture to the air.  Use a saline spray or container (neti pot) to flush out the nose (nasal irrigation). These methods can help clear away mucus and keep the nasal passages moist. General instructions  Take  over-the-counter and prescription medicines only as told by your health care provider.  Follow instructions from your health care provider about eating or drinking restrictions. You may need to avoid caffeine.  Avoid things that you know you are allergic to (allergens), like dust, mold, pollen, pets, or certain foods.  Drink enough fluid to keep your urine pale yellow.  Keep all follow-up visits as told by your health care provider. This is important. Contact a health care provider if:  You have a fever.  You have a sore throat.  You have difficulty swallowing.  You have headache.  You have sinus pain.  You have a cough that does not go away.  The mucus from your nose becomes thick and is green or yellow in color.  You have cold or flu symptoms that last more than 10 days. Summary  Postnasal drip is the feeling of mucus going down the back of your throat.  If your health care provider approves, use nasal irrigation or a nasal spray 2?4 times a day.  Avoid things that you know you are allergic to (allergens), like dust, mold, pollen, pets, or certain foods. This information is not intended to replace advice given to you by your health care provider. Make sure you discuss any questions you have with your health care provider. Document Released: 08/24/2016 Document Revised: 08/24/2016 Document Reviewed: 08/24/2016 Elsevier Interactive Patient Education  2019 [employer].

## 2018-09-15 NOTE — Progress Notes (Signed)
Virtual Visit via Video Note  I connected with Denise Campos  on 09/15/18 at  3:30 PM EDT by a video enabled telemedicine application and verified that I am speaking with the correct person. Patient unfortunately was not able to connect to the video component, but the appointment was completed successfully with audio.  Location patient: home Location provider:work or home office Persons participating in the virtual visit: patient, provider  I discussed the limitations of evaluation and management by telemedicine and the availability of in person appointments. The patient expressed understanding and agreed to proceed.   HPI:  Denise Campos is a pleasant 53 yo with a PMH significant for Hypertension, B12 deficiency, allergic rhinitis, GERD, GAD, insomnia, endometriosis, migraines, chronic back pain and recurrent UTIs seen for follow up.  Reports has had laryngitis. Had mild sore throat, sinus congestion, mild cough, ears full, fever to high of 101. Temp today 100.5 She has spoken with health at work and [place] was neg. Had strep testing and it was negative. No body aches, SOB. She has some allergy issues. She gets recurrent sinus infections. Reports had sinuitis in March.  Reports otherwise doing well. Continues to take the b12, lyrica, bp meds. She has been made aware several times of HM measures past due, but she still has not had her mammogram or colon cancer screenings. Now these are postponed in light of the [place] pandemic. She did stool cards and plans to follow up with her GI doc once pandemic is over. She agrees to schedule mammogram once pandemic is over.  ROS: See pertinent positives and negatives per HPI.  Past Medical History:  Diagnosis Date  . Anemia 06/21/2017   -patient reports chronic, normal for her  . B12 deficiency 06/10/2017  . Cancer ([place])   . Endometriosis   . Essential hypertension 06/10/2017  . GERD (gastroesophageal reflux disease)   . History of stomach ulcers   .  Hypertension   . Migraines   . Other chronic pain 06/10/2017  . UTI (urinary tract infection)     Past Surgical History:  Procedure Laterality Date  . APPENDECTOMY    . TONSILLECTOMY AND ADENOIDECTOMY      No family history on file.  SOCIAL HX: see HPI   Current Outpatient Medications:  .  acetaminophen (TYLENOL) 500 MG tablet, Take 500 mg by mouth every 6 (six) hours as needed., Disp: , Rfl:  .  amLODipine (NORVASC) 2.5 MG tablet, Take 1 tablet (2.5 mg total) by mouth daily., Disp: 90 tablet, Rfl: 1 .  cetirizine (ZYRTEC) 10 MG tablet, Take 1 tablet (10 mg total) by mouth daily., Disp: 30 tablet, Rfl: 11 .  Cholecalciferol (VITAMIN D3) 1000 units CAPS, Take 5,000 Units by mouth daily. , Disp: , Rfl:  .  cyclobenzaprine (FLEXERIL) 10 MG tablet, Take 1 tablet (10 mg total) by mouth 3 (three) times daily as needed for muscle spasms., Disp: 90 tablet, Rfl: 1 .  EPINEPHrine (EPIPEN IJ), Inject as directed., Disp: , Rfl:  .  fluticasone (FLONASE) 50 MCG/ACT nasal spray, Place 1 spray into both nostrils daily., Disp: 48 g, Rfl: 1 .  ibuprofen (ADVIL,MOTRIN) 800 MG tablet, Take 1 tablet (800 mg total) by mouth every 8 (eight) hours as needed., Disp: 270 tablet, Rfl: 0 .  Magnesium 500 MG TABS, Take 500 mg by mouth daily., Disp: , Rfl:  .  naratriptan (AMERGE) 2.5 MG tablet, Take 1 tablet (2.5 mg total) by mouth as needed for migraine. Take one (1) tablet at onset of  headache; if returns or does not resolve, may repeat after 4 hours; do not exceed five (5) mg in 24 hours., Disp: 30 tablet, Rfl: 0 .  potassium chloride SA (KLOR-CON M20) 20 MEQ tablet, Take 1 tablet (20 mEq total) by mouth 3 (three) times daily., Disp: 270 tablet, Rfl: 1 .  pregabalin (LYRICA) 50 MG capsule, Take 1 capsule (50 mg total) by mouth 3 (three) times daily., Disp: 270 capsule, Rfl: 1 .  promethazine-dextromethorphan (PROMETHAZINE-DM) 6.25-15 MG/5ML syrup, Take 5 mLs by mouth 4 (four) times daily as needed for up to 7  days., Disp: 140 mL, Rfl: 0 .  RABEprazole (ACIPHEX) 20 MG tablet, Take 1 tablet (20 mg total) by mouth daily., Disp: 90 tablet, Rfl: 1 .  Saccharomyces boulardii (FLORASTOR PO), Take by mouth daily., Disp: , Rfl:  .  triamterene-hydrochlorothiazide (MAXZIDE-25) 37.5-25 MG tablet, Take 1 tablet by mouth at bedtime., Disp: 90 tablet, Rfl: 1 .  valsartan (DIOVAN) 160 MG tablet, Take 1 tablet (160 mg total) by mouth at bedtime., Disp: 90 tablet, Rfl: 1 .  vitamin B-12 (CYANOCOBALAMIN) 1000 MCG tablet, Take 1,000 mcg by mouth daily., Disp: , Rfl:   EXAM:  VITALS per patient if applicable: BP 937/90 T 100.5 this morning, W 166  GENERAL: alert, oriented, appears well and in no acute distress  HEENT: atraumatic, conjunttiva clear, no obvious abnormalities on inspection of external nose and ears  NECK: normal movements of the head and neck  LUNGS: on inspection no signs of respiratory distress, breathing rate appears normal, no obvious gross SOB, gasping or wheezing  CV: no obvious cyanosis  MS: moves all visible extremities without noticeable abnormality  PSYCH/NEURO: pleasant and cooperative, no obvious depression or anxiety, speech and thought processing grossly intact  ASSESSMENT AND PLAN:  Discussed the following assessment and plan:  Laryngitis Sinus congestion Dysfunction of both eustachian tubes -we discussed possible serious and likely etiologies, workup and treatment, treatment risks and return precautions. Suspect viral. She reports has had COVID, RVP and strep testing at work. She had vitals thru a work visit today and was afebrile there - reports that was on analgesic. Did still advise staying home for seven days or at least 3 days without a fever and advised prompt follow up if worsening or symptoms persist. -she wants to see ENT has despite ins and antihistamine has persistent sinus and ETD chronically. Recommend options and she plans to call for appt when better of if not  improving next week. -of course, we advised [place]  to return or notify a doctor immediately if symptoms worsen or persist or new concerns arise.  Essential hypertension -low end today - she report low always when sick but denies orthostatic symptoms, disziness, etc -advised staying hydrated while sick -postponing routine labs in light of pandemic -follow up/TOC with Dr. Ethlyn Gallery in a few months, consider labs then  B12 deficiency -on lower dose, continue  Other chronic pain -continue current tx  She declined colonoscopy/mammogram right now in pandemic. Advised to do as soon as possible once available again.  I discussed the assessment and treatment plan with the patient. The patient was provided an opportunity to ask questions and all were answered. The patient agreed with the plan and demonstrated an understanding of the instructions.   The patient was advised to call back or seek an in-person evaluation if the symptoms worsen or if the condition fails to improve as anticipated.    Follow up instructions: Advised assistant Wendie Simmer to help  patient arrange the following: -follow up/CPE with Dr. Ethlyn Gallery in 2-3 months  Lucretia Kern, DO

## 2018-09-15 NOTE — Progress Notes (Addendum)
MRN: [id number] DOB: 1966-02-16  Subjective:   Denise Campos is a 53 y.o. female presenting for chief complaint of Sore Throat (2 DAYS); Fever (2 DAYS, 100.8 F, 100.5); and Sinus Problem (3 DAYS) .  Reports a 3 day history of fever, sinus headache, sinus congestion , sinus pain, ear fullness, sore throat and dry cough, hoarseness, fatigue, nausea and vomiting. Has tried Allegra and Tylenol for relief. Denies itchy watery eyes, red eyes, ear drainage, inability to swallow, productive cough, wheezing, shortness of breath and chest tightness, night sweats, weight loss, abdominal pain and diarrhea. Rates current throat pain 5/10 at present. 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 Further denies recent travel. Patient does have a  history of seasonal allergies and recurrent sinusitis, denies history of asthma. Patient has had flu shot this season. denies smoking. Denies any other aggravating or relieving factors, no other questions or concerns.  Patient was treated for sinusitis in our office last month, received Doxycycline at that time.  The patient was suspected to have COVID-19.  Patient contacted [facility] at Work and was tested, her test results were negative.  Patient was told to come be tested for strep since her COVID-19 test was negative and her symptoms persisted.  Review of Systems  Constitutional: Positive for fever and malaise/fatigue.  HENT: Positive for congestion, ear pain, sinus pain and sore throat ( 2/2 PND).        + PND  Eyes: Negative.   Respiratory: Positive for cough. Negative for sputum production, shortness of breath and wheezing.   Cardiovascular: Negative.   Gastrointestinal: Positive for nausea and vomiting. Negative for abdominal pain.  Skin: Negative.   Neurological: Positive for headaches. Negative for dizziness, speech change, focal weakness, seizures and weakness.  Endo/Heme/Allergies: Positive for environmental allergies.     Denise Campos has a current medication list which includes the following prescription(s): acetaminophen, amlodipine, vitamin d3, cyclobenzaprine, epinephrine, fexofenadine, fluticasone, ibuprofen, magnesium, potassium chloride sa, pregabalin, rabeprazole, saccharomyces boulardii, triamterene-hydrochlorothiazide, valsartan, vitamin b-12, azelastine, naratriptan, and prednisone. Also is allergic to bee venom.  Denise Campos  has a past medical history of Anemia (06/21/2017), B12 deficiency (06/10/2017), Cancer ([place]), Endometriosis, Essential hypertension (06/10/2017), GERD (gastroesophageal reflux disease), History of stomach ulcers, Hypertension, Migraines, Other chronic pain (06/10/2017), and UTI (urinary tract infection). Also  has a past surgical history that includes Appendectomy and Tonsillectomy and adenoidectomy.   Objective:   Vitals: BP 100/68 (BP Location: Right Arm, Patient Position: Sitting, Cuff Size: Normal)   Pulse 85   Temp 98.5 F (36.9 C) (Oral)   Resp 18   Wt 166 lb (75.3 kg)   SpO2 100%   BMI 26.00 kg/m   Physical Exam Vitals signs reviewed.  HENT:     Head: Normocephalic.     Right Ear: Ear canal and external ear normal. A middle ear effusion is present. Tympanic membrane is not erythematous.     Left Ear: Ear canal and external ear normal. A middle ear effusion is present. Tympanic membrane is not erythematous.     Nose: Mucosal edema, congestion and rhinorrhea (clear nasal drainage) present.     Right Turbinates: Enlarged and swollen.     Left Turbinates: Enlarged and swollen.     Right Sinus: Maxillary sinus tenderness present. No frontal sinus tenderness.     Left Sinus: Maxillary sinus tenderness present. No frontal sinus tenderness.     Mouth/Throat:     Lips: Pink.     Mouth: Mucous membranes  are moist.     Pharynx: Uvula midline. Posterior oropharyngeal erythema present. No pharyngeal swelling, oropharyngeal exudate or uvula swelling.     Tonsils: No tonsillar exudate.   Eyes:     Conjunctiva/sclera: Conjunctivae normal.  Neck:     Musculoskeletal: Normal range of motion and neck supple.  Cardiovascular:     Rate and Rhythm: Normal rate and regular rhythm.     Heart sounds: Normal heart sounds.  Pulmonary:     Effort: Pulmonary effort is normal. No respiratory distress.     Breath sounds: Normal breath sounds. No wheezing, rhonchi or rales.  Abdominal:     General: Bowel sounds are normal.     Palpations: Abdomen is soft.     Tenderness: There is no abdominal tenderness.  Lymphadenopathy:     Cervical: No cervical adenopathy.  Skin:    General: Skin is warm and dry.     Capillary Refill: Capillary refill takes less than 2 seconds.  Neurological:     General: No focal deficit present.     Mental Status: She is alert and oriented to person, place, and time.  Psychiatric:        Mood and Affect: Mood normal.        Behavior: Behavior normal.    Centor Score: Tonsillar exudate: 0 Fever/hx of fever > 38 degrees Celsius: 0 Tender anterior cervical lymphadenopathy: 0 Absence of cough: 0 Age greater than 45 years: -1 Total: -1   Results for orders placed or performed in visit on 09/15/18 (from the past 24 hour(s))  POCT rapid strep A     Status: Normal   Collection Time: 09/15/18 12:40 PM  Result Value Ref Range   Rapid Strep A Screen Negative Negative    Assessment and Plan :   Exam findings, diagnosis etiology and medication use and indications reviewed with patient. Follow- Up and discharge instructions provided. No emergent/urgent issues found on exam. The patient has bilateral middle ear effusions that are consistent with her viral/allergy symptoms.  The patient reports a short-lived low-grade temp and sinus pressure, but I do not feel she has developed a bacterial sinus infection at this time in the absence of purulent nasal drainage, nasal congestion/obstruction and facial pain.  I am going to recommend adding Zyrtec back to her allergy  regimen and will have the patient continue using her Flonase.  I am also going to prescribe Promethazine DM to help with her cough, nausea and URI symptoms.  The patient was informed during her last appointment that due to the frequent recurrence of her symptoms, I feel an ENT referral would be beneficial.  The patient has an appointment scheduled with her PCP next week and I would like her to discuss this at that time. Patient has been ruled out for COVID-19 at this time.  I did inform patient to monitor her sinus symptoms for worsening.  Patient is well-appearing, is in no acute distress and vital signs are stable. Patient education was provided. Patient verbalized understanding of information provided and agrees with plan of care (POC), all questions answered. The patient is advised to call or return to clinic if conditiondoes not see an improvement in symptoms, or to seek the care of the closest emergency department if conditionworsens with the above plan.  1. Sore throat  - POCT rapid strep A  2. Viral sinusitis  - cetirizine (ZYRTEC) 10 MG tablet; Take 1 tablet (10 mg total) by mouth daily.  Dispense: 30 tablet; Refill: 11 -  promethazine-dextromethorphan (PROMETHAZINE-DM) 6.25-15 MG/5ML syrup; Take 5 mLs by mouth 4 (four) times daily as needed for up to 7 days.  Dispense: 140 mL; Refill: 0 -Take medication as prescribed.  I would like you to continue using Flonase as directed. -Continue Ibuprofen or Tylenol for pain, fever or general discomfort. -Increase fluids. -Get plenty of rest. -Continue normal saline use as needed for nasal congestion. -Use a humidifier when at home and during sleep to help with nasal congestion. -Warm saltwater gargles 3-4 times daily to help with throat discomfort.  May also use a teaspoon of honey as needed. -I would like you to follow up with your PCP as scheduled for next week.  Please discuss a referral for ENT for recurrent sinusitis. -You will need to stay at  home until you have been fever free for at least 72 hours without the use of antipyretics.  Stay in contact with [facility].

## 2018-09-19 ENCOUNTER — Telehealth: Payer: Self-pay

## 2018-09-19 NOTE — Telephone Encounter (Signed)
Patient did not answered the phone 

## 2018-09-21 ENCOUNTER — Encounter: Payer: Self-pay | Admitting: Family Medicine

## 2018-09-23 MED FILL — TRIAMTERENE/HCTZ 37.5/25 TB: 37.5-25 | 90 days supply | Qty: 90 | Fill #0

## 2018-09-23 MED FILL — RABEPRAZOLE SOD DR 20 MG TA: 20 | 90 days supply | Qty: 90 | Fill #0

## 2018-09-23 MED FILL — PREGABALIN 50 MG CAPS: 50 | 90 days supply | Qty: 270 | Fill #0

## 2018-09-23 MED FILL — POTASSIUM CHLORIDE CRYS ER: 20 | 90 days supply | Qty: 270 | Fill #0

## 2018-09-23 MED FILL — AMLODIPINE 2.5 MG TABLET: 2.5 | 90 days supply | Qty: 90 | Fill #0

## 2018-09-23 MED FILL — VALSARTAN 80 MG TABS: 80 | 30 days supply | Qty: 60 | Fill #0

## 2018-09-26 ENCOUNTER — Encounter: Payer: Self-pay | Admitting: Family Medicine

## 2018-09-26 ENCOUNTER — Other Ambulatory Visit: Payer: Self-pay | Admitting: Family Medicine

## 2018-09-26 MED ORDER — DOXYCYCLINE HYCLATE 100 MG PO TABS: 100.0000 mg | ORAL_TABLET | Freq: Two times a day (BID) | ORAL | 0 refills | Status: AC

## 2018-09-26 NOTE — Telephone Encounter (Signed)
I sent doxycycline to [facility] pharmacy (if she wants to diff pharmacy ok to transfer) for her. If any worsening of symptoms let us know or if not improved after treatment.   Also please make sure no chance of pregnancy.

## 2018-10-07 ENCOUNTER — Encounter: Payer: Self-pay | Admitting: Family Medicine

## 2018-10-18 ENCOUNTER — Other Ambulatory Visit: Payer: Self-pay | Admitting: Family Medicine

## 2018-10-18 NOTE — Telephone Encounter (Signed)
I left a message for the pt to return my call.  CRM also created. 

## 2018-10-18 NOTE — Telephone Encounter (Signed)
Please contact pt and see if she would like to change to another similar medication or have [place] send rx to another pharmacy. Losartan 50mg  qd or Irbesartan 150 mg qd are similar drugs. If she wants to switch ok to send. 90 days with 1 refill.

## 2018-10-19 ENCOUNTER — Other Ambulatory Visit: Payer: Self-pay | Admitting: Family Medicine

## 2018-10-21 ENCOUNTER — Encounter: Payer: Self-pay | Admitting: *Deleted

## 2018-10-21 NOTE — Telephone Encounter (Signed)
Patient did call back after hours.  Patient works night. Please leave detail message on mychart. Call back # [phone]

## 2018-10-21 NOTE — Telephone Encounter (Signed)
Message sent via Mychart with info below.

## 2018-10-21 NOTE — Telephone Encounter (Signed)
I left a message for the pt to return my call. 

## 2018-10-24 MED ORDER — VALSARTAN 160 MG PO TABS: 160.0000 mg | ORAL_TABLET | Freq: Every day | ORAL | 0 refills | Status: DC

## 2018-10-24 NOTE — Telephone Encounter (Signed)
See second Mychart message.

## 2018-11-07 ENCOUNTER — Other Ambulatory Visit: Payer: Self-pay | Admitting: Family Medicine

## 2018-11-07 ENCOUNTER — Encounter: Payer: Self-pay | Admitting: Family Medicine

## 2018-11-07 MED ORDER — LOSARTAN POTASSIUM 50 MG PO TABS: 50.0000 mg | ORAL_TABLET | Freq: Every day | ORAL | 1 refills | Status: DC

## 2018-11-07 MED FILL — LOSARTAN POTASSIUM 50 MG TA: 50 | 90 days supply | Qty: 90 | Fill #0

## 2018-12-14 ENCOUNTER — Ambulatory Visit: Payer: Self-pay | Admitting: Family Medicine

## 2018-12-15 ENCOUNTER — Encounter: Payer: Self-pay | Admitting: Family Medicine

## 2018-12-16 ENCOUNTER — Other Ambulatory Visit: Payer: Self-pay | Admitting: Family Medicine

## 2018-12-17 ENCOUNTER — Encounter: Payer: Self-pay | Admitting: Family Medicine

## 2018-12-19 MED FILL — RABEPRAZOLE SOD DR 20 MG TA: 20 | 90 days supply | Qty: 90 | Fill #0

## 2018-12-19 MED FILL — AMLODIPINE 2.5 MG TABLET: 2.5 | 90 days supply | Qty: 90 | Fill #0

## 2018-12-19 MED FILL — TRIAMTERENE/HCTZ 37.5/25 TB: 37.5-25 | 90 days supply | Qty: 90 | Fill #0

## 2018-12-19 MED FILL — POTASSIUM CHLORIDE CRYS ER: 20 | 90 days supply | Qty: 270 | Fill #0

## 2018-12-20 ENCOUNTER — Encounter: Payer: Self-pay | Admitting: Family Medicine

## 2018-12-20 ENCOUNTER — Other Ambulatory Visit: Payer: Self-pay

## 2018-12-20 ENCOUNTER — Ambulatory Visit (INDEPENDENT_AMBULATORY_CARE_PROVIDER_SITE_OTHER): Payer: 59 | Admitting: Family Medicine

## 2018-12-20 ENCOUNTER — Telehealth: Payer: Self-pay | Admitting: *Deleted

## 2018-12-20 VITALS — BP 115/72

## 2018-12-20 DIAGNOSIS — I10 Essential (primary) hypertension: Secondary | ICD-10-CM | POA: Diagnosis not present

## 2018-12-20 MED ORDER — LOSARTAN POTASSIUM 100 MG PO TABS: 100.0000 mg | ORAL_TABLET | Freq: Every day | ORAL | 1 refills | Status: DC

## 2018-12-20 MED FILL — LOSARTAN POTASSIUM 100 MG T: 100 | 90 days supply | Qty: 90 | Fill #0

## 2018-12-20 NOTE — Progress Notes (Signed)
Virtual Visit via Video Note  I connected with Denise Campos  on 12/20/18 at 11:20 AM EDT by a video enabled telemedicine application and verified that I am speaking with the correct person using two identifiers.  Location patient: home Location provider:work or home office Persons participating in the virtual visit: patient, provider    HPI:  Follow up HTN: -she feels like BP has been elevated spme since changing from valsartan to losartan due to recall and pharmacy issues -now this past week has had a lot of stress due to fathers health issues, finishing school and work -BP this week have been running in the 130-160/80-100s -she had tried taking 100mg  of losartan in the past and didn't help a lot -took double dose of losartan last night and BP is 115/72 today -she gets some headaches when her BP is up, no reported CP, SOB   ROS: See pertinent positives and negatives per HPI.  Past Medical History:  Diagnosis Date  . Anemia 06/21/2017   -patient reports chronic, normal for her  . B12 deficiency 06/10/2017  . Cancer ([place])   . Endometriosis   . Essential hypertension 06/10/2017  . GERD (gastroesophageal reflux disease)   . History of stomach ulcers   . Hypertension   . Migraines   . Other chronic pain 06/10/2017  . UTI (urinary tract infection)     Past Surgical History:  Procedure Laterality Date  . APPENDECTOMY    . TONSILLECTOMY AND ADENOIDECTOMY      History reviewed. No pertinent family history.  SOCIAL HX: see hpi   Current Outpatient Medications:  .  acetaminophen (TYLENOL) 500 MG tablet, Take 500 mg by mouth every 6 (six) hours as needed., Disp: , Rfl:  .  amLODipine (NORVASC) 2.5 MG tablet, TAKE 1 TABLET BY MOUTH DAILY., Disp: 90 tablet, Rfl: 0 .  cetirizine (ZYRTEC) 10 MG tablet, Take 1 tablet (10 mg total) by mouth daily., Disp: 30 tablet, Rfl: 11 .  Cholecalciferol (VITAMIN D3) 1000 units CAPS, Take 5,000 Units by mouth daily. , Disp: , Rfl:  .  cyclobenzaprine  (FLEXERIL) 10 MG tablet, Take 1 tablet (10 mg total) by mouth 3 (three) times daily as needed for muscle spasms., Disp: 90 tablet, Rfl: 1 .  EPINEPHrine (EPIPEN IJ), Inject as directed., Disp: , Rfl:  .  fluticasone (FLONASE) 50 MCG/ACT nasal spray, Place 1 spray into both nostrils daily., Disp: 48 g, Rfl: 1 .  ibuprofen (ADVIL,MOTRIN) 800 MG tablet, Take 1 tablet (800 mg total) by mouth every 8 (eight) hours as needed., Disp: 270 tablet, Rfl: 0 .  losartan (COZAAR) 100 MG tablet, Take 1 tablet (100 mg total) by mouth daily., Disp: 90 tablet, Rfl: 1 .  Magnesium 500 MG TABS, Take 500 mg by mouth daily., Disp: , Rfl:  .  naratriptan (AMERGE) 2.5 MG tablet, Take 1 tablet (2.5 mg total) by mouth as needed for migraine. Take one (1) tablet at onset of headache; if returns or does not resolve, may repeat after 4 hours; do not exceed five (5) mg in 24 hours., Disp: 30 tablet, Rfl: 0 .  potassium chloride SA (K-DUR) 20 MEQ tablet, TAKE 1 TABLET BY MOUTH 3 TIMES DAILY., Disp: 270 tablet, Rfl: 0 .  pregabalin (LYRICA) 50 MG capsule, Take 1 capsule (50 mg total) by mouth 3 (three) times daily., Disp: 270 capsule, Rfl: 1 .  RABEprazole (ACIPHEX) 20 MG tablet, TAKE 1 TABLET BY MOUTH DAILY., Disp: 90 tablet, Rfl: 0 .  Saccharomyces boulardii (FLORASTOR  PO), Take by mouth daily., Disp: , Rfl:  .  triamterene-hydrochlorothiazide (MAXZIDE-25) 37.5-25 MG tablet, TAKE 1 TABLET BY MOUTH AT BEDTIME., Disp: 90 tablet, Rfl: 0 .  vitamin B-12 (CYANOCOBALAMIN) 1000 MCG tablet, Take 1,000 mcg by mouth daily., Disp: , Rfl:   EXAM:  VITALS per patient if applicable: Vitals:   76/80/88 1141  BP: 115/72    GENERAL: alert, oriented, appears well and in no acute distress  HEENT: atraumatic, conjunttiva clear, no obvious abnormalities on inspection of external nose and ears  NECK: normal movements of the head and neck  LUNGS: on inspection no signs of respiratory distress, breathing rate appears normal, no obvious gross  SOB, gasping or wheezing  CV: no obvious cyanosis  MS: moves all visible extremities without noticeable abnormality  PSYCH/NEURO: pleasant and cooperative, no obvious depression or anxiety, speech and thought processing grossly intact  ASSESSMENT AND PLAN:  Discussed the following assessment and plan:  Essential hypertension - -she was debating trying to get back on valsartan vs losartan 100 daily vs increasing the norvasc -opted to stick with losartan 100mg  daily for now with home monitoring -offered labs, she prefers to wait until in office visit and do all at once -advised to call if any further issues prior to upcoming in  Office visit -follow up with me in 4-6 months   I discussed the assessment and treatment plan with the patient. The patient was provided an opportunity to ask questions and all were answered. The patient agreed with the plan and demonstrated an understanding of the instructions.   The patient was advised to call back or seek an in-person evaluation if the symptoms worsen or if the condition fails to improve as anticipated.   Lucretia Kern, DO

## 2018-12-20 NOTE — Telephone Encounter (Signed)
I left a detailed message at the pts cell number to call back for the appt as below.

## 2018-12-20 NOTE — Telephone Encounter (Signed)
-----   Message from Lucretia Kern, DO sent at 12/20/2018  2:02 PM EDT ----- -follow up with Dr. Maudie Mercury in 4-6 months-keep follow up o/w with Dr. Ethlyn Gallery as scheduled

## 2018-12-29 ENCOUNTER — Other Ambulatory Visit: Payer: Self-pay | Admitting: Family Medicine

## 2019-01-15 DIAGNOSIS — N390 Urinary tract infection, site not specified: Secondary | ICD-10-CM | POA: Diagnosis not present

## 2019-01-17 ENCOUNTER — Ambulatory Visit: Payer: 59 | Admitting: Family Medicine

## 2019-01-20 ENCOUNTER — Ambulatory Visit: Payer: 59 | Admitting: Family Medicine

## 2019-02-03 ENCOUNTER — Other Ambulatory Visit: Payer: Self-pay

## 2019-02-03 ENCOUNTER — Telehealth (INDEPENDENT_AMBULATORY_CARE_PROVIDER_SITE_OTHER): Payer: 59 | Admitting: Family Medicine

## 2019-02-03 ENCOUNTER — Telehealth: Payer: Self-pay | Admitting: Family Medicine

## 2019-02-03 DIAGNOSIS — I10 Essential (primary) hypertension: Secondary | ICD-10-CM | POA: Diagnosis not present

## 2019-02-03 MED ORDER — VALSARTAN 160 MG PO TABS: 160.0000 mg | ORAL_TABLET | Freq: Every day | ORAL | 1 refills | Status: DC

## 2019-02-03 MED FILL — VALSARTAN 160 MG TABLET: 160 | 90 days supply | Qty: 90 | Fill #0

## 2019-02-03 NOTE — Telephone Encounter (Signed)
Patient sent a MyChart message however she called in and made an appointment for the same issue.

## 2019-02-03 NOTE — Progress Notes (Signed)
Virtual Visit via Video Note  I connected with Denise Campos on 02/03/19 at  3:30 PM EDT by a video enabled telemedicine application 2/2 [id number] pandemic and verified that I am speaking with the correct person using two identifiers.  Location patient: home Location provider:work or home office Persons participating in the virtual visit: patient, provider  I discussed the limitations of evaluation and management by telemedicine and the availability of in person appointments. The patient expressed understanding and agreed to proceed.   HPI: Pt was on valsartan for BP, but had to switch to losartan 2/2 a med shoratage.  Also taking Norvasc 2.5 mg daily.  Pt nauseated and dizzy since taking the med.  Pt had to call out from work several times due to not feeling well.  Pt has been sipping on fluids and ate a few spoonfuls of oatmeal this am.  BP was 120s/89 this am, 140s/80s during visit.  Pt states this is elevated for her.  Pt taking zofran prn.  Pt notes increased stress as her mother is going through chemo.  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     ROS: See pertinent positives and negatives per HPI.  Past Medical History:  Diagnosis Date  . Anemia 06/21/2017   -patient reports chronic, normal for her  . B12 deficiency 06/10/2017  . Cancer [place])   . Endometriosis   . Essential hypertension 06/10/2017  . GERD (gastroesophageal reflux disease)   . History of stomach ulcers   . Hypertension   . Migraines   . Other chronic pain 06/10/2017  . UTI (urinary tract infection)     Past Surgical History:  Procedure Laterality Date  . APPENDECTOMY    . TONSILLECTOMY AND ADENOIDECTOMY      No family history on file.   Current Outpatient Medications:  .  acetaminophen (TYLENOL) 500 MG tablet, Take 500 mg by mouth every 6 (six) hours as needed., Disp: , Rfl:  .  amLODipine (NORVASC) 2.5 MG tablet, TAKE 1 TABLET BY MOUTH DAILY., Disp: 90 tablet, Rfl: 0 .   cetirizine (ZYRTEC) 10 MG tablet, Take 1 tablet (10 mg total) by mouth daily., Disp: 30 tablet, Rfl: 11 .  Cholecalciferol (VITAMIN D3) 1000 units CAPS, Take 5,000 Units by mouth daily. , Disp: , Rfl:  .  cyclobenzaprine (FLEXERIL) 10 MG tablet, Take 1 tablet (10 mg total) by mouth 3 (three) times daily as needed for muscle spasms., Disp: 90 tablet, Rfl: 1 .  EPINEPHrine (EPIPEN IJ), Inject as directed., Disp: , Rfl:  .  fluticasone (FLONASE) 50 MCG/ACT nasal spray, Place 1 spray into both nostrils daily., Disp: 48 g, Rfl: 1 .  ibuprofen (ADVIL,MOTRIN) 800 MG tablet, Take 1 tablet (800 mg total) by mouth every 8 (eight) hours as needed., Disp: 270 tablet, Rfl: 0 .  losartan (COZAAR) 100 MG tablet, Take 1 tablet (100 mg total) by mouth daily., Disp: 90 tablet, Rfl: 1 .  Magnesium 500 MG TABS, Take 500 mg by mouth daily., Disp: , Rfl:  .  naratriptan (AMERGE) 2.5 MG tablet, Take 1 tablet (2.5 mg total) by mouth as needed for migraine. Take one (1) tablet at onset of headache; if returns or does not resolve, may repeat after 4 hours; do not exceed five (5) mg in 24 hours., Disp: 30 tablet, Rfl: 0 .  potassium chloride SA (K-DUR) 20 MEQ tablet, TAKE 1 TABLET BY MOUTH 3 TIMES DAILY., Disp: 270 tablet, Rfl: 0 .  pregabalin (LYRICA) 50 MG capsule, Take 1 capsule (50 mg total) by mouth 3 (three) times daily., Disp: 270 capsule, Rfl: 1 .  RABEprazole (ACIPHEX) 20 MG tablet, TAKE 1 TABLET BY MOUTH DAILY., Disp: 90 tablet, Rfl: 0 .  Saccharomyces boulardii (FLORASTOR PO), Take by mouth daily., Disp: , Rfl:  .  triamterene-hydrochlorothiazide (MAXZIDE-25) 37.5-25 MG tablet, TAKE 1 TABLET BY MOUTH AT BEDTIME., Disp: 90 tablet, Rfl: 0 .  vitamin B-12 (CYANOCOBALAMIN) 1000 MCG tablet, Take 1,000 mcg by mouth daily., Disp: , Rfl:   EXAM:  VITALS per patient if applicable:  GENERAL: alert, oriented, appears well and in no acute distress  HEENT: atraumatic, conjunctiva clear, no obvious abnormalities on  inspection of external nose and ears  NECK: normal movements of the head and neck  LUNGS: on inspection no signs of respiratory distress, breathing rate appears normal, no obvious gross SOB, gasping or wheezing  CV: no obvious cyanosis  MS: moves all visible extremities without noticeable abnormality  PSYCH/NEURO: pleasant and cooperative, no obvious depression or anxiety, speech and thought processing grossly intact  ASSESSMENT AND PLAN:  Discussed the following assessment and plan:  Essential hypertension  -elevated -will d/c losartan 100 mg. -restart valsartan.  Pt contacted pharmacy during call to check availability -continue Norvasc 2.5 mg -discussed lifestyle modifications -given precautions - Plan: valsartan (DIOVAN) 160 MG tablet  Pt to f/u next wk with Dr. Ethlyn Gallery for CPE and labs.    I discussed the assessment and treatment plan with the patient. The patient was provided an opportunity to ask questions and all were answered. The patient agreed with the plan and demonstrated an understanding of the instructions.   The patient was advised to call back or seek an in-person evaluation if the symptoms worsen or if the condition fails to improve as anticipated.   Billie Ruddy, MD

## 2019-02-10 ENCOUNTER — Encounter: Payer: Self-pay | Admitting: Family Medicine

## 2019-02-10 ENCOUNTER — Ambulatory Visit: Payer: 59 | Admitting: Family Medicine

## 2019-02-10 ENCOUNTER — Other Ambulatory Visit: Payer: Self-pay

## 2019-02-10 VITALS — BP 100/70 | HR 74 | Temp 97.7°F | Ht 67.0 in | Wt 169.1 lb

## 2019-02-10 DIAGNOSIS — R5383 Other fatigue: Secondary | ICD-10-CM

## 2019-02-10 DIAGNOSIS — G43809 Other migraine, not intractable, without status migrainosus: Secondary | ICD-10-CM

## 2019-02-10 DIAGNOSIS — I10 Essential (primary) hypertension: Secondary | ICD-10-CM

## 2019-02-10 DIAGNOSIS — H539 Unspecified visual disturbance: Secondary | ICD-10-CM

## 2019-02-10 DIAGNOSIS — G43909 Migraine, unspecified, not intractable, without status migrainosus: Secondary | ICD-10-CM | POA: Insufficient documentation

## 2019-02-10 DIAGNOSIS — D649 Anemia, unspecified: Secondary | ICD-10-CM

## 2019-02-10 DIAGNOSIS — E538 Deficiency of other specified B group vitamins: Secondary | ICD-10-CM

## 2019-02-10 DIAGNOSIS — N921 Excessive and frequent menstruation with irregular cycle: Secondary | ICD-10-CM | POA: Diagnosis not present

## 2019-02-10 DIAGNOSIS — Z131 Encounter for screening for diabetes mellitus: Secondary | ICD-10-CM | POA: Diagnosis not present

## 2019-02-10 DIAGNOSIS — Z13220 Encounter for screening for lipoid disorders: Secondary | ICD-10-CM | POA: Diagnosis not present

## 2019-02-10 LAB — COMPREHENSIVE METABOLIC PANEL
ALT: 17 U/L (ref 0–35)
AST: 17 U/L (ref 0–37)
Albumin: 4.1 g/dL (ref 3.5–5.2)
Alkaline Phosphatase: 90 U/L (ref 39–117)
BUN: 11 mg/dL (ref 6–23)
CO2: 29 mEq/L (ref 19–32)
Calcium: 9.5 mg/dL (ref 8.4–10.5)
Chloride: 100 mEq/L (ref 96–112)
Creatinine, Ser: 0.65 mg/dL (ref 0.40–1.20)
GFR: 94.81 mL/min (ref >60.00)
Glucose, Bld: 85 mg/dL (ref 70–99)
Potassium: 3.9 mEq/L (ref 3.5–5.1)
Sodium: 137 mEq/L (ref 135–145)
Total Bilirubin: 0.4 mg/dL (ref 0.2–1.2)
Total Protein: 6.4 g/dL (ref 6.0–8.3)

## 2019-02-10 LAB — CBC WITH DIFFERENTIAL/PLATELET
Basophils Absolute: 0 10*3/uL (ref 0.0–0.1)
Basophils Relative: 0.4 % (ref 0.0–3.0)
Eosinophils Absolute: 0.3 10*3/uL (ref 0.0–0.7)
Eosinophils Relative: 3.7 % (ref 0.0–5.0)
HCT: 33.6 % — ABNORMAL LOW (ref 36.0–46.0)
Hemoglobin: 11.4 g/dL — ABNORMAL LOW (ref 12.0–15.0)
Lymphocytes Relative: 49.3 % — ABNORMAL HIGH (ref 12.0–46.0)
Lymphs Abs: 3.8 10*3/uL (ref 0.7–4.0)
MCHC: 33.8 g/dL (ref 30.0–36.0)
MCV: 92.6 fl (ref 78.0–100.0)
Monocytes Absolute: 0.5 10*3/uL (ref 0.1–1.0)
Monocytes Relative: 6.5 % (ref 3.0–12.0)
Neutro Abs: 3.1 10*3/uL (ref 1.4–7.7)
Neutrophils Relative %: 40.1 % — ABNORMAL LOW (ref 43.0–77.0)
Platelets: 348 10*3/uL (ref 150.0–400.0)
RBC: 3.63 Mil/uL — ABNORMAL LOW (ref 3.87–5.11)
RDW: 12.3 % (ref 11.5–15.5)
WBC: 7.6 10*3/uL (ref 4.0–10.5)

## 2019-02-10 LAB — VITAMIN B12: Vitamin B-12: >1500 pg/mL — ABNORMAL HIGH (ref 211–911)

## 2019-02-10 LAB — LIPID PANEL
Cholesterol: 168 mg/dL (ref 0–200)
HDL: 39.5 mg/dL (ref >39.00)
LDL Cholesterol: 110 mg/dL — ABNORMAL HIGH (ref 0–99)
NonHDL: 128.59
Total CHOL/HDL Ratio: 4
Triglycerides: 93 mg/dL (ref 0.0–149.0)
VLDL: 18.6 mg/dL (ref 0.0–40.0)

## 2019-02-10 LAB — IBC + FERRITIN
Ferritin: 61.3 ng/mL (ref 10.0–291.0)
Iron: 78 ug/dL (ref 42–145)
Saturation Ratios: 26.3 % (ref 20.0–50.0)
Transferrin: 212 mg/dL (ref 212.0–360.0)

## 2019-02-10 LAB — FOLATE: Folate: >24.7 ng/mL (ref >5.9)

## 2019-02-10 LAB — HEMOGLOBIN A1C: Hgb A1c MFr Bld: 5.5 % (ref 4.6–6.5)

## 2019-02-10 LAB — TSH: TSH: 1.64 u[IU]/mL (ref 0.35–4.50)

## 2019-02-10 LAB — VITAMIN D 25 HYDROXY (VIT D DEFICIENCY, FRACTURES): VITD: 73.66 ng/mL (ref 30.00–100.00)

## 2019-02-10 NOTE — Patient Instructions (Addendum)
Add up calories: shoot for 1500 calories/day.   Let me know about: dermatology, gynecology,and ophthalmology and whom you would like to see

## 2019-02-10 NOTE — Progress Notes (Signed)
Denise Campos DOB: 1965/10/07 Encounter date: 02/10/2019  This is a 53 y.o. female who presents to establish care. Chief Complaint  Patient presents with  . 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 Exhausted depending on schedule.  History of present illness: Would like to schedule bloodwork.   Heaviest she has been in entire life. She isn't exercising like she should. Does eat pretty healthy. Not big carbohydrate fan. No fast food. Just frustrated being size she is. Does drink a lot of sweet tea and regular soda.   HTN: amlodipine 2.5mg , recently switched to valsartan from losartan, maxzide 37.5-25.Marland Kitchen Entire time on losartan didn't feel right. Was nauseated all the time. Just has a hard time tolerating other meds. Feels really well with bp in the low 100's/60-70.   Migraines: amerge. Only occur when she is on her period. Just when she is having periods, but seem better. Amerge does help when she takes it.   GERD: aciphex keeps symptoms under control. Tries not to take ibuprofen unless she really needs it.   Needs to set up gynecologist, dentist. Has endometriosis and wishes she would go through menopause. Older sister went through menopause recently around age 28. Periods are heavy and duration is sporadic.   Had COVID testing in Feb - had nose bleed x 3 days, then had sinus infection that she had to go to urgent care for. Has had brown spot in right eye since this time. Thinks that dot is there all the time, but notes more with focus on computer screen, monitor.   On lyrica to help with back pain. Has back pain, knee pain. Takes motrin at most 3 days/week on work days; otherwise avoids this.    Sleeps very well.    Past Medical History:  Diagnosis Date  . Anemia 06/21/2017   -patient reports chronic, normal for her  . B12 deficiency 06/10/2017  . Cancer ([place])    basal cell carcinoma  . Endometriosis   . Essential hypertension 06/10/2017  . GERD  (gastroesophageal reflux disease)   . History of stomach ulcers   . Hypertension   . Migraines   . Other chronic pain 06/10/2017  . UTI (urinary tract infection)    Past Surgical History:  Procedure Laterality Date  . APPENDECTOMY    . KNEE ARTHROSCOPY WITH FULKERSON SLIDE Right    followed by screw removal  . knee sugery Left    slipped patella  . LAPAROSCOPIC ENDOMETRIOSIS FULGURATION    . lasic surg Bilateral   . left knee arthroscopy    . LUMBAR DISC SURGERY     L5S1  . lumbar fusion     L3-S1  . TONSILLECTOMY AND ADENOIDECTOMY     Allergies  Allergen Reactions  . Bee Venom Anaphylaxis  . Lisinopril     Didn't maintain bp well; didn't feel well  . Losartan     Nausea; but tolerates valsartan ok   Current Meds  Medication Sig  . amLODipine (NORVASC) 2.5 MG tablet TAKE 1 TABLET BY MOUTH DAILY.  Marland Kitchen aspirin EC 81 MG tablet Take 81 mg by mouth daily.  . cetirizine (ZYRTEC) 10 MG tablet Take 1 tablet (10 mg total) by mouth daily.  . Cholecalciferol (VITAMIN D3) 1000 units CAPS Take 5,000 Units by mouth daily.   Marland Kitchen CRANBERRY PO Take 8,400 mg by mouth daily.  . cyclobenzaprine (FLEXERIL) 10 MG tablet Take 1 tablet (10 mg total) by mouth 3 (three) times daily as needed  for muscle spasms.  Marland Kitchen EPINEPHrine (EPIPEN IJ) Inject as directed.  . fluticasone (FLONASE) 50 MCG/ACT nasal spray Place 1 spray into both nostrils daily.  . Magnesium 500 MG TABS Take 500 mg by mouth daily.  . naratriptan (AMERGE) 2.5 MG tablet Take 1 tablet (2.5 mg total) by mouth as needed for migraine. Take one (1) tablet at onset of headache; if returns or does not resolve, may repeat after 4 hours; do not exceed five (5) mg in 24 hours.  . potassium chloride SA (K-DUR) 20 MEQ tablet TAKE 1 TABLET BY MOUTH 3 TIMES DAILY.  Marland Kitchen pregabalin (LYRICA) 50 MG capsule Take 1 capsule (50 mg total) by mouth 3 (three) times daily.  . RABEprazole (ACIPHEX) 20 MG tablet TAKE 1 TABLET BY MOUTH DAILY.  Marland Kitchen Saccharomyces boulardii  (FLORASTOR PO) Take by mouth daily.  Marland Kitchen triamterene-hydrochlorothiazide (MAXZIDE-25) 37.5-25 MG tablet TAKE 1 TABLET BY MOUTH AT BEDTIME.  . valsartan (DIOVAN) 160 MG tablet Take 1 tablet (160 mg total) by mouth daily.  . vitamin B-12 (CYANOCOBALAMIN) 1000 MCG tablet Take 1,000 mcg by mouth every other day.   . [DISCONTINUED] acetaminophen (TYLENOL) 500 MG tablet Take 500 mg by mouth every 6 (six) hours as needed.  . [DISCONTINUED] ibuprofen (ADVIL,MOTRIN) 800 MG tablet Take 1 tablet (800 mg total) by mouth every 8 (eight) hours as needed.   Social History   Tobacco Use  . Smoking status: Never Smoker  . Smokeless tobacco: Never Used  Substance Use Topics  . Alcohol use: No    Frequency: Never   Family History  Problem Relation Age of Onset  . Skin cancer Father      Review of Systems  Constitutional: Negative for chills, fatigue and fever.  HENT: Negative for congestion and postnasal drip.   Eyes: Negative for photophobia, pain and discharge. Visual disturbance: brown ?floater right eye.  Respiratory: Negative for cough, chest tightness, shortness of breath and wheezing.   Cardiovascular: Negative for chest pain, palpitations and leg swelling.  Gastrointestinal: Negative for abdominal pain and blood in stool.  Genitourinary: Positive for menstrual problem (endometriosis).  Musculoskeletal: Positive for arthralgias and back pain.  Neurological: Positive for headaches (improving).    Objective:  BP 100/70 (BP Location: Left Arm, Patient Position: Sitting, Cuff Size: Large)   Pulse 74   Temp 97.7 F (36.5 C) (Temporal)   Ht 5\' 7"  (1.702 m)   Wt 169 lb 1.6 oz (76.7 kg)   SpO2 95%   BMI 26.48 kg/m   Weight: 169 lb 1.6 oz (76.7 kg)   BP Readings from Last 3 Encounters:  02/10/19 100/70  12/20/18 115/72  09/15/18 110/68   Wt Readings from Last 3 Encounters:  02/10/19 169 lb 1.6 oz (76.7 kg)  09/15/18 166 lb (75.3 kg)  09/15/18 166 lb (75.3 kg)    EXAM:  Physical  Exam Constitutional:      General: She is not in acute distress.    Appearance: She is well-developed.  Eyes:     General: Lids are normal.        Right eye: No foreign body, discharge or hordeolum.        Left eye: No foreign body, discharge or hordeolum.     Extraocular Movements: Extraocular movements intact.     Conjunctiva/sclera: Conjunctivae normal.     Right eye: Right conjunctiva is not injected. No exudate or hemorrhage.    Left eye: Left conjunctiva is not injected. No exudate or hemorrhage.    Pupils:  Pupils are equal, round, and reactive to light.     Comments: Funduscopic exam difficult secondary to pupillary constriction.   Neck:     Musculoskeletal: Normal range of motion and neck supple.     Thyroid: No thyroid mass, thyromegaly or thyroid tenderness.  Cardiovascular:     Rate and Rhythm: Normal rate and regular rhythm.     Heart sounds: Normal heart sounds. No murmur. No friction rub.  Pulmonary:     Effort: Pulmonary effort is normal. No respiratory distress.     Breath sounds: Normal breath sounds. No wheezing or rales.  Musculoskeletal:     Right lower leg: No edema.     Left lower leg: No edema.  Lymphadenopathy:     Cervical: No cervical adenopathy.  Neurological:     Mental Status: She is alert and oriented to person, place, and time.  Psychiatric:        Behavior: Behavior normal.      Assessment/Plan  1. Other fatigue We will start with blood work and consider further evaluation pending these results. - CBC with Differential/Platelet; Future - Comprehensive metabolic panel; Future - TSH; Future - IBC + Ferritin; Future - VITAMIN D 25 Hydroxy (Vit-D Deficiency, Fractures); Future - VITAMIN D 25 Hydroxy (Vit-D Deficiency, Fractures) - IBC + Ferritin - TSH - Comprehensive metabolic panel - CBC with Differential/Platelet  2. Menometrorrhagia She wants to get established with gynecology.  She had a name in mind, but cannot remember now.  I have  asked her to let us know so we can help facilitate referral for her. - CBC with Differential/Platelet; Future - IBC + Ferritin; Future - IBC + Ferritin - CBC with Differential/Platelet  3. Other migraine without status migrainosus, not intractable Overall migraines have improved.  Continue as needed treatment.  4. Vision changes She needs to see ophthalmology.  I am uncertain if COVID testing is related to current eye concerns.  I do not feel like there is a urgent need for referral since she has had stability with "brown floater", but I would like her to have specialist evaluation.  5. Lipid screening - Lipid panel; Future - Lipid panel  6. Essential hypertension Continue current medication.  7. B12 deficiency - Vitamin B12; Future - Folate; Future - Folate - Vitamin B12  8. Screening for diabetes mellitus - Hemoglobin A1c; Future - Hemoglobin A1c    Return for pending bloodwork.    Micheline Rough, MD

## 2019-02-11 ENCOUNTER — Other Ambulatory Visit: Payer: Self-pay | Admitting: Family Medicine

## 2019-02-11 MED ORDER — POTASSIUM CHLORIDE CRYS ER 20 MEQ PO TBCR: 20.0000 meq | EXTENDED_RELEASE_TABLET | Freq: Three times a day (TID) | ORAL | 1 refills | Status: DC

## 2019-02-11 MED ORDER — PREGABALIN 50 MG PO CAPS: 50.0000 mg | ORAL_CAPSULE | Freq: Three times a day (TID) | ORAL | 1 refills | Status: DC

## 2019-02-11 MED ORDER — AMLODIPINE BESYLATE 2.5 MG PO TABS: 2.5000 mg | ORAL_TABLET | Freq: Every day | ORAL | 1 refills | Status: DC

## 2019-02-11 MED ORDER — EPINEPHRINE 0.3 MG/0.3ML IJ SOAJ: 0.3000 mg | INTRAMUSCULAR | 2 refills | Status: DC | PRN

## 2019-02-11 MED ORDER — TRIAMTERENE-HCTZ 37.5-25 MG PO TABS: 1.0000 | ORAL_TABLET | Freq: Every day | ORAL | 1 refills | Status: DC

## 2019-02-11 MED ORDER — RABEPRAZOLE SODIUM 20 MG PO TBEC: 20.0000 mg | DELAYED_RELEASE_TABLET | Freq: Every day | ORAL | 1 refills | Status: DC

## 2019-02-13 ENCOUNTER — Other Ambulatory Visit: Payer: Self-pay | Admitting: Family Medicine

## 2019-02-13 MED ORDER — PREGABALIN 50 MG PO CAPS: 50.0000 mg | ORAL_CAPSULE | Freq: Three times a day (TID) | ORAL | 1 refills | Status: DC

## 2019-02-14 NOTE — Addendum Note (Signed)
Addended by: Agnes Lawrence on: 02/14/2019 03:08 PM   Modules accepted: Orders

## 2019-03-13 MED FILL — AMLODIPINE 2.5 MG TABLET: 2.5 | 90 days supply | Qty: 90 | Fill #0

## 2019-03-13 MED FILL — POTASSIUM CHLORIDE CRYS ER: 20 | 90 days supply | Qty: 270 | Fill #0

## 2019-03-14 ENCOUNTER — Other Ambulatory Visit: Payer: Self-pay | Admitting: Family Medicine

## 2019-03-19 MED FILL — TRIAMTERENE-HCTZ 37.5-25 MG: 37.5-25 | 90 days supply | Qty: 90 | Fill #0

## 2019-04-04 MED FILL — RABEPRAZOLE SOD DR 20 MG TA: 20 | 90 days supply | Qty: 90 | Fill #0

## 2019-04-11 MED FILL — AMLODIPINE 2.5 MG TABLET: 2.5 | 90 days supply | Qty: 90 | Fill #0

## 2019-04-11 MED FILL — PREGABALIN 50 MG CAPS: 50 | 90 days supply | Qty: 270 | Fill #0

## 2019-04-25 MED FILL — TRIAMTERENE-HCTZ 37.5-25 MG: 37.5-25 | 90 days supply | Qty: 90 | Fill #0

## 2019-04-25 MED FILL — FLUTICASONE PROP 50 MCG SPR: 50 | 60 days supply | Qty: 16 | Fill #2

## 2019-04-25 MED FILL — VALSARTAN 160 MG TABLET: 160 | 90 days supply | Qty: 90 | Fill #1

## 2019-05-17 MED FILL — POTASSIUM CHLORIDE CRYS ER: 20 | 90 days supply | Qty: 270 | Fill #0

## 2019-06-08 ENCOUNTER — Encounter: Payer: Self-pay | Admitting: Family Medicine

## 2019-06-28 DIAGNOSIS — H5212 Myopia, left eye: Secondary | ICD-10-CM | POA: Diagnosis not present

## 2019-07-04 NOTE — Telephone Encounter (Signed)
Error

## 2019-07-05 MED FILL — RABEPRAZOLE SOD DR 20 MG TA: 20 | 90 days supply | Qty: 90 | Fill #0

## 2019-07-11 MED FILL — AMLODIPINE 2.5 MG TABLET: 2.5 | 90 days supply | Qty: 90 | Fill #1

## 2019-07-11 MED FILL — PREGABALIN 50 MG CAPS: 50 | 90 days supply | Qty: 270 | Fill #1

## 2019-07-13 ENCOUNTER — Telehealth: Payer: 59 | Admitting: Physician Assistant

## 2019-07-13 DIAGNOSIS — N3000 Acute cystitis without hematuria: Secondary | ICD-10-CM

## 2019-07-13 MED ORDER — NITROFURANTOIN MONOHYD MACRO 100 MG PO CAPS: 100.0000 mg | ORAL_CAPSULE | Freq: Two times a day (BID) | ORAL | 0 refills | Status: DC

## 2019-07-13 NOTE — Progress Notes (Signed)
We are sorry that you are not feeling well.  Here is how we plan to help!  Based on what you shared with me it looks like you most likely have a simple urinary tract infection.  A UTI (Urinary Tract Infection) is a bacterial infection of the bladder.  Most cases of urinary tract infections are simple to treat but a key part of your care is to encourage you to drink plenty of fluids and watch your symptoms carefully.  I have prescribed MacroBid 100 mg twice a day for 5 days.  Your symptoms should gradually improve. Call us if the burning in your urine worsens, you develop worsening fever, back pain or pelvic pain or if your symptoms do not resolve after completing the antibiotic.  Urinary tract infections can be prevented by drinking plenty of water to keep your body hydrated.  Also be sure when you wipe, wipe from front to back and don't hold it in!  If possible, empty your bladder every 4 hours.  Your e-visit answers were reviewed by a board certified advanced clinical practitioner to complete your personal care plan.  Depending on the condition, your plan could have included both over the counter or prescription medications.  If there is a problem please reply  once you have received a response from your provider.  Your safety is important to [place].  If you have drug allergies check your prescription carefully.    You can use MyChart to ask questions about today's visit, request a non-urgent call back, or ask for a work or school excuse for 24 hours related to this e-Visit. If it has been greater than 24 hours you will need to follow up with your provider, or enter a new e-Visit to address those concerns.   You will get an e-mail in the next two days asking about your experience.  I hope that your e-visit has been valuable and will speed your recovery. Thank you for using e-visits.  Particia Nearing PA-C   Approximately 5 minutes was spent documenting and reviewing patient's chart.

## 2019-07-29 ENCOUNTER — Telehealth: Payer: 59 | Admitting: Nurse Practitioner

## 2019-07-29 DIAGNOSIS — N3000 Acute cystitis without hematuria: Secondary | ICD-10-CM

## 2019-07-29 MED ORDER — SULFAMETHOXAZOLE-TRIMETHOPRIM 800-160 MG PO TABS: 1.0000 | ORAL_TABLET | Freq: Two times a day (BID) | ORAL | 0 refills | Status: DC

## 2019-07-29 NOTE — Progress Notes (Signed)
We are sorry that you are not feeling well.  Here is how we plan to help!  We usually do not treat recurrent UTI. But since you did not complete all of your Macrobid as prescribed, I will go ahead and treat you this time.  Based on what you shared with me it looks like you most likely have a simple urinary tract infection.  A UTI (Urinary Tract Infection) is a bacterial infection of the bladder.  Most cases of urinary tract infections are simple to treat but a key part of your care is to encourage you to drink plenty of fluids and watch your symptoms carefully.  I have prescribed Keflex 500 mg twice a day for 7 days.  Your symptoms should gradually improve. Call us if the burning in your urine worsens, you develop worsening fever, back pain or pelvic pain or if your symptoms do not resolve after completing the antibiotic.  Urinary tract infections can be prevented by drinking plenty of water to keep your body hydrated.  Also be sure when you wipe, wipe from front to back and don't hold it in!  If possible, empty your bladder every 4 hours.  Your e-visit answers were reviewed by a board certified advanced clinical practitioner to complete your personal care plan.  Depending on the condition, your plan could have included both over the counter or prescription medications.  If there is a problem please reply  once you have received a response from your provider.  Your safety is important to [place].  If you have drug allergies check your prescription carefully.    You can use MyChart to ask questions about today's visit, request a non-urgent call back, or ask for a work or school excuse for 24 hours related to this e-Visit. If it has been greater than 24 hours you will need to follow up with your provider, or enter a new e-Visit to address those concerns.   You will get an e-mail in the next two days asking about your experience.  I hope that your e-visit has been valuable and will speed your  recovery. Thank you for using e-visits.  5-10 minutes spent reviewing and documenting in chart.

## 2019-08-01 ENCOUNTER — Other Ambulatory Visit: Payer: Self-pay | Admitting: Family Medicine

## 2019-08-01 DIAGNOSIS — I10 Essential (primary) hypertension: Secondary | ICD-10-CM

## 2019-08-01 MED FILL — TRIAMTERENE/HCTZ 37.5/25 TB: 37.5-25 | 90 days supply | Qty: 90 | Fill #1

## 2019-08-02 MED FILL — VALSARTAN 160 MG TABLET: 160 | 90 days supply | Qty: 90 | Fill #0

## 2019-08-02 NOTE — Telephone Encounter (Signed)
Dr. Koberlein pt 

## 2019-08-03 NOTE — Progress Notes (Signed)
Virtual Visit via Video Note  I connected with Denise Campos  on 08/04/19 at  9:45 AM EST by a video enabled telemedicine application and verified that I am speaking with the correct person using two identifiers.  Location patient: home Location provider:work or home office Persons participating in the virtual visit: patient, provider  I discussed the limitations of evaluation and management by telemedicine and the availability of in person appointments. The patient expressed understanding and agreed to proceed.   Denise Campos DOB: Apr 30, 1966 Encounter date: 08/04/2019  This is a 54 y.o. female who presents with Chief Complaint  Patient presents with  . Hypertension    History of present illness:  HTN: amlodipine 2.5mg , valsartan, maxzide. 106/69. No issues with dizziness or light headed.   Migraine: amerge  GERD: aciphex  Back pain: lyrica  Endometriosis: still having period; erratic. Spaced 1-2 months apart. Does get associated migraine with this. Did miss work with this one time although amerge usually works.   ?didn't follow up with GI for colonoscopy: she didn't go because she got sick with strep and hasn't called back to schedule. With COVID she has moved this down on list.   ?remember name of gyn she wants referral to: she will ask at work tonight.  ??ophthalmology: saw 2/3. 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 Nothing to do for this. Otherwise healthy.    Mammogram: hasn't had this done. She is scared about this. Wants to wait for gyn to order.    Allergies  Allergen Reactions  . Bee Venom Anaphylaxis  . Lisinopril     Didn't maintain bp well; didn't feel well  . Losartan     Nausea; but tolerates valsartan ok   No outpatient medications have been marked as taking for the 08/04/19 encounter (Telemedicine) with Caren Macadam, MD.    Review of Systems  Constitutional: Negative for chills, fatigue and fever.  Respiratory: Negative for cough, chest tightness,  shortness of breath and wheezing.   Cardiovascular: Negative for chest pain, palpitations and leg swelling.    Objective:  There were no vitals taken for this visit.      BP Readings from Last 3 Encounters:  02/10/19 100/70  12/20/18 115/72  09/15/18 110/68   Wt Readings from Last 3 Encounters:  02/10/19 169 lb 1.6 oz (76.7 kg)  09/15/18 166 lb (75.3 kg)  09/15/18 166 lb (75.3 kg)    EXAM:  GENERAL: alert, oriented, appears well and in no acute distress  HEENT: atraumatic, conjunctiva clear, no obvious abnormalities on inspection of external nose and ears  NECK: normal movements of the head and neck  LUNGS: on inspection no signs of respiratory distress, breathing rate appears normal, no obvious gross SOB, gasping or wheezing  CV: no obvious cyanosis  MS: moves all visible extremities without noticeable abnormality  PSYCH/NEURO: pleasant and cooperative, no obvious depression or anxiety, speech and thought processing grossly intact  Assessment/Plan  1. Essential hypertension Blood pressure well controlled now.  Continue current medications. - CBC with Differential/Platelet; Future - Comprehensive metabolic panel; Future  2. Gastroesophageal reflux disease without esophagitis Stable.  Continue AcipHex.  3. Other chronic pain Lyrica still helping with back pain.  4. Other migraine without status migrainosus, not intractable Has been well controlled, except for around menses.  Zofran sent in as this helps her with nausea during times of migraine.  Continue with naratriptan as needed.  5. Endometriosis She will let me know about gynecologist she wishes to see.  Referral will  be placed when she lets me know.  6. Colon cancer screening See above.  She needs to reschedule this.  She has contact information.  7. Encounter for screening mammogram for malignant neoplasm of breast She prefers to wait and have gynecology order mammogram.  8. B12 deficiency - Vitamin  B12; Future  9. Lipid screening - Lipid panel; Future    I discussed the assessment and treatment plan with the patient. The patient was provided an opportunity to ask questions and all were answered. The patient agreed with the plan and demonstrated an understanding of the instructions.   The patient was advised to call back or seek an in-person evaluation if the symptoms worsen or if the condition fails to improve as anticipated.  I provided 28 minutes of non-face-to-face time during this encounter.   Micheline Rough, MD

## 2019-08-04 ENCOUNTER — Telehealth (INDEPENDENT_AMBULATORY_CARE_PROVIDER_SITE_OTHER): Payer: 59 | Admitting: Family Medicine

## 2019-08-04 ENCOUNTER — Other Ambulatory Visit: Payer: Self-pay

## 2019-08-04 DIAGNOSIS — I10 Essential (primary) hypertension: Secondary | ICD-10-CM | POA: Diagnosis not present

## 2019-08-04 DIAGNOSIS — N809 Endometriosis, unspecified: Secondary | ICD-10-CM

## 2019-08-04 DIAGNOSIS — E538 Deficiency of other specified B group vitamins: Secondary | ICD-10-CM | POA: Diagnosis not present

## 2019-08-04 DIAGNOSIS — G43809 Other migraine, not intractable, without status migrainosus: Secondary | ICD-10-CM

## 2019-08-04 DIAGNOSIS — Z1211 Encounter for screening for malignant neoplasm of colon: Secondary | ICD-10-CM | POA: Diagnosis not present

## 2019-08-04 DIAGNOSIS — K219 Gastro-esophageal reflux disease without esophagitis: Secondary | ICD-10-CM | POA: Diagnosis not present

## 2019-08-04 DIAGNOSIS — G8929 Other chronic pain: Secondary | ICD-10-CM | POA: Diagnosis not present

## 2019-08-04 DIAGNOSIS — Z1231 Encounter for screening mammogram for malignant neoplasm of breast: Secondary | ICD-10-CM | POA: Diagnosis not present

## 2019-08-04 DIAGNOSIS — Z13220 Encounter for screening for lipoid disorders: Secondary | ICD-10-CM

## 2019-08-04 MED ORDER — ONDANSETRON HCL 4 MG PO TABS: 4.0000 mg | ORAL_TABLET | Freq: Three times a day (TID) | ORAL | 1 refills | Status: DC | PRN

## 2019-08-04 MED FILL — ONDANSETRON HCL 4 MG TABLET: 4 | 10 days supply | Qty: 30 | Fill #0

## 2019-08-08 ENCOUNTER — Telehealth: Payer: Self-pay | Admitting: *Deleted

## 2019-08-08 NOTE — Telephone Encounter (Signed)
-----   Message from Caren Macadam, MD sent at 08/06/2019  9:56 AM EDT ----- Can you please schedule labs then physical. Let her know i'm looking into cologuard coverage and let her know I couldn't find the power point link for COVID vaccine I was looking for unfortunately (it explained the testing process for approval and answered a lot of concerns with vaccination; there are some FAQ on the [employer] website that are helpful too, but I liked that presentation so was looking for that to send to her)

## 2019-08-08 NOTE — Telephone Encounter (Signed)
Left a detailed message with the information below and asked that the pt call back to schedule appts for labs and a physical exam.

## 2019-09-06 MED FILL — POTASSIUM CHLORIDE CRYS ER: 20 | 90 days supply | Qty: 270 | Fill #1

## 2019-10-11 ENCOUNTER — Other Ambulatory Visit: Payer: Self-pay | Admitting: Family Medicine

## 2019-10-11 MED FILL — AMLODIPINE 2.5 MG TABLET: 2.5 | 90 days supply | Qty: 90 | Fill #0

## 2019-10-12 MED FILL — TRIAMTERENE-HCTZ 37.5-25 MG: 37.5-25 | 90 days supply | Qty: 90 | Fill #0

## 2019-10-20 ENCOUNTER — Encounter: Payer: Self-pay | Admitting: Family Medicine

## 2019-10-20 ENCOUNTER — Other Ambulatory Visit: Payer: Self-pay

## 2019-10-20 ENCOUNTER — Ambulatory Visit (INDEPENDENT_AMBULATORY_CARE_PROVIDER_SITE_OTHER): Payer: 59 | Admitting: Family Medicine

## 2019-10-20 VITALS — BP 100/72 | HR 81 | Temp 97.6°F | Ht 66.5 in | Wt 172.7 lb

## 2019-10-20 DIAGNOSIS — E611 Iron deficiency: Secondary | ICD-10-CM | POA: Diagnosis not present

## 2019-10-20 DIAGNOSIS — Z131 Encounter for screening for diabetes mellitus: Secondary | ICD-10-CM

## 2019-10-20 DIAGNOSIS — E538 Deficiency of other specified B group vitamins: Secondary | ICD-10-CM | POA: Diagnosis not present

## 2019-10-20 DIAGNOSIS — R635 Abnormal weight gain: Secondary | ICD-10-CM

## 2019-10-20 DIAGNOSIS — Z85828 Personal history of other malignant neoplasm of skin: Secondary | ICD-10-CM

## 2019-10-20 DIAGNOSIS — Z1231 Encounter for screening mammogram for malignant neoplasm of breast: Secondary | ICD-10-CM

## 2019-10-20 DIAGNOSIS — I10 Essential (primary) hypertension: Secondary | ICD-10-CM

## 2019-10-20 DIAGNOSIS — E559 Vitamin D deficiency, unspecified: Secondary | ICD-10-CM

## 2019-10-20 DIAGNOSIS — Z0000 Encounter for general adult medical examination without abnormal findings: Secondary | ICD-10-CM | POA: Diagnosis not present

## 2019-10-20 DIAGNOSIS — K219 Gastro-esophageal reflux disease without esophagitis: Secondary | ICD-10-CM

## 2019-10-20 DIAGNOSIS — Z13220 Encounter for screening for lipoid disorders: Secondary | ICD-10-CM | POA: Diagnosis not present

## 2019-10-20 LAB — CBC WITH DIFFERENTIAL/PLATELET
Basophils Absolute: 0 10*3/uL (ref 0.0–0.1)
Basophils Relative: 0.5 % (ref 0.0–3.0)
Eosinophils Absolute: 0.4 10*3/uL (ref 0.0–0.7)
Eosinophils Relative: 4.4 % (ref 0.0–5.0)
HCT: 34.6 % — ABNORMAL LOW (ref 36.0–46.0)
Hemoglobin: 11.9 g/dL — ABNORMAL LOW (ref 12.0–15.0)
Lymphocytes Relative: 53.4 % — ABNORMAL HIGH (ref 12.0–46.0)
Lymphs Abs: 4.8 10*3/uL — ABNORMAL HIGH (ref 0.7–4.0)
MCHC: 34.2 g/dL (ref 30.0–36.0)
MCV: 93 fl (ref 78.0–100.0)
Monocytes Absolute: 0.5 10*3/uL (ref 0.1–1.0)
Monocytes Relative: 5 % (ref 3.0–12.0)
Neutro Abs: 3.3 10*3/uL (ref 1.4–7.7)
Neutrophils Relative %: 36.7 % — ABNORMAL LOW (ref 43.0–77.0)
Platelets: 353 10*3/uL (ref 150.0–400.0)
RBC: 3.73 Mil/uL — ABNORMAL LOW (ref 3.87–5.11)
RDW: 12.6 % (ref 11.5–15.5)
WBC: 9.1 10*3/uL (ref 4.0–10.5)

## 2019-10-20 LAB — COMPREHENSIVE METABOLIC PANEL
ALT: 13 U/L (ref 0–35)
AST: 17 U/L (ref 0–37)
Albumin: 4.7 g/dL (ref 3.5–5.2)
Alkaline Phosphatase: 96 U/L (ref 39–117)
BUN: 12 mg/dL (ref 6–23)
CO2: 28 mEq/L (ref 19–32)
Calcium: 9.8 mg/dL (ref 8.4–10.5)
Chloride: 97 mEq/L (ref 96–112)
Creatinine, Ser: 0.68 mg/dL (ref 0.40–1.20)
GFR: 89.77 mL/min (ref >60.00)
Glucose, Bld: 97 mg/dL (ref 70–99)
Potassium: 3.6 mEq/L (ref 3.5–5.1)
Sodium: 135 mEq/L (ref 135–145)
Total Bilirubin: 0.3 mg/dL (ref 0.2–1.2)
Total Protein: 7.1 g/dL (ref 6.0–8.3)

## 2019-10-20 LAB — LIPID PANEL
Cholesterol: 184 mg/dL (ref 0–200)
HDL: 49.7 mg/dL (ref >39.00)
LDL Cholesterol: 120 mg/dL — ABNORMAL HIGH (ref 0–99)
NonHDL: 133.85
Total CHOL/HDL Ratio: 4
Triglycerides: 71 mg/dL (ref 0.0–149.0)
VLDL: 14.2 mg/dL (ref 0.0–40.0)

## 2019-10-20 LAB — IBC + FERRITIN
Ferritin: 44.7 ng/mL (ref 10.0–291.0)
Iron: 54 ug/dL (ref 42–145)
Saturation Ratios: 14.9 % — ABNORMAL LOW (ref 20.0–50.0)
Transferrin: 259 mg/dL (ref 212.0–360.0)

## 2019-10-20 LAB — HEMOGLOBIN A1C: Hgb A1c MFr Bld: 5.4 % (ref 4.6–6.5)

## 2019-10-20 LAB — VITAMIN B12: Vitamin B-12: 1313 pg/mL — ABNORMAL HIGH (ref 211–911)

## 2019-10-20 LAB — TSH: TSH: 2.3 u[IU]/mL (ref 0.35–4.50)

## 2019-10-20 LAB — VITAMIN D 25 HYDROXY (VIT D DEFICIENCY, FRACTURES): VITD: 95.48 ng/mL (ref 30.00–100.00)

## 2019-10-20 NOTE — Patient Instructions (Signed)
Let me know about gynecologist so we can get you set up with them.

## 2019-10-20 NOTE — Progress Notes (Signed)
Denise Campos DOB: May 20, 1966 Encounter date: 10/20/2019  This is a 54 y.o. female who presents for complete physical   History of present illness/Additional concerns:  Has endometriosis that is pretty significant. Has had a hard time with pap smears in past - increased pain with these. Still menstruating.   No other concerns today. Did want to let me know that she saw ophtho in march - for last year had been seeing large brown spot. 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   Hypertension: doing well back on medications. Didn't do as well on the losartan, but now that diovan is back she is doing well.   Migraines: have been bad when she gets them. They are menstrual. Periods are spacing out some. Has to just stop all activity when she gets them.   GERD: aciphex controls this.   Back pain and knee pain: continues with back pain and knee pain.   Still frustrated with weight gain. Not big carb person. Really good with eating vegetables. Not eating out. Not exercising, but never has.   Past Medical History:  Diagnosis Date  . Anemia 06/21/2017   -patient reports chronic, normal for her  . B12 deficiency 06/10/2017  . Cancer ([place])    basal cell carcinoma  . Endometriosis   . Essential hypertension 06/10/2017  . GERD (gastroesophageal reflux disease)   . History of stomach ulcers   . Hypertension   . Migraines   . Other chronic pain 06/10/2017  . UTI (urinary tract infection)    Past Surgical History:  Procedure Laterality Date  . APPENDECTOMY    . KNEE ARTHROSCOPY WITH FULKERSON SLIDE Right    followed by screw removal  . knee sugery Left    slipped patella  . LAPAROSCOPIC ENDOMETRIOSIS FULGURATION    . lasic surg Bilateral   . left knee arthroscopy    . LUMBAR DISC SURGERY     L5S1  . lumbar fusion     L3-S1  . TONSILLECTOMY AND ADENOIDECTOMY     Allergies  Allergen Reactions  . Bee Venom Anaphylaxis  .  Lisinopril     Didn't maintain bp well; didn't feel well  . Losartan     Nausea; but tolerates valsartan ok   Current Meds  Medication Sig  . amLODipine (NORVASC) 2.5 MG tablet TAKE 1 TABLET (2.5 MG TOTAL) BY MOUTH DAILY.  Marland Kitchen aspirin EC 81 MG tablet Take 81 mg by mouth daily.  . cetirizine (ZYRTEC) 10 MG tablet Take 1 tablet (10 mg total) by mouth daily.  . Cholecalciferol (VITAMIN D3) 1000 units CAPS Take 5,000 Units by mouth daily.   Marland Kitchen CRANBERRY PO Take 8,400 mg by mouth daily.  . cyclobenzaprine (FLEXERIL) 10 MG tablet Take 1 tablet (10 mg total) by mouth 3 (three) times daily as needed for muscle spasms.  Marland Kitchen EPINEPHrine 0.3 mg/0.3 mL IJ SOAJ injection Inject 0.3 mLs (0.3 mg total) into the muscle as needed for anaphylaxis.  . fluticasone (FLONASE) 50 MCG/ACT nasal spray Place 1 spray into both nostrils daily.  . Magnesium 500 MG TABS Take 500 mg by mouth daily.  . naratriptan (AMERGE) 2.5 MG tablet Take 1 tablet (2.5 mg total) by mouth as needed for migraine. Take one (1) tablet at onset of headache; if returns or does not resolve, may repeat after 4 hours; do not exceed five (5) mg in 24 hours.  . ondansetron (ZOFRAN) 4  MG tablet Take 1 tablet (4 mg total) by mouth every 8 (eight) hours as needed for nausea or vomiting.  . potassium chloride SA (K-DUR) 20 MEQ tablet Take 1 tablet (20 mEq total) by mouth 3 (three) times daily.  . pregabalin (LYRICA) 50 MG capsule Take 1 capsule (50 mg total) by mouth 3 (three) times daily.  . RABEprazole (ACIPHEX) 20 MG tablet Take 1 tablet (20 mg total) by mouth daily.  . Saccharomyces boulardii (FLORASTOR PO) Take by mouth daily.  Marland Kitchen triamterene-hydrochlorothiazide (MAXZIDE-25) 37.5-25 MG tablet TAKE 1 TABLET BY MOUTH AT BEDTIME.  . valsartan (DIOVAN) 160 MG tablet TAKE 1 TABLET (160 MG TOTAL) BY MOUTH DAILY.  . vitamin B-12 (CYANOCOBALAMIN) 1000 MCG tablet Take 1,000 mcg by mouth daily. Take 1 tablet by mouth every 3 days   Social History   Tobacco  Use  . Smoking status: Never Smoker  . Smokeless tobacco: Never Used  Substance Use Topics  . Alcohol use: No   Family History  Problem Relation Age of Onset  . Skin cancer Father        squamous  . Hypertension Father   . Stroke Father 39  . Heart disease Father   . Myelodysplastic syndrome Mother   . Asthma Mother   . High Cholesterol Sister   . Heart disease Sister        toprol for tachycardia  . Melanoma Paternal Uncle   . Asthma Sister      Review of Systems  Constitutional: Negative for activity change, appetite change, chills, fatigue, fever and unexpected weight change.  HENT: Negative for congestion, ear pain, hearing loss, sinus pressure, sinus pain, sore throat and trouble swallowing.   Eyes: Negative for pain and visual disturbance.  Respiratory: Negative for cough, chest tightness, shortness of breath and wheezing.   Cardiovascular: Negative for chest pain, palpitations and leg swelling.  Gastrointestinal: Negative for abdominal pain, blood in stool, constipation, diarrhea, nausea and vomiting.  Genitourinary: Negative for difficulty urinating and menstrual problem.  Musculoskeletal: Negative for arthralgias and back pain.  Skin: Negative for rash.  Neurological: Negative for dizziness, weakness, numbness and headaches.  Hematological: Negative for adenopathy. Does not bruise/bleed easily.  Psychiatric/Behavioral: Negative for sleep disturbance and suicidal ideas. The patient is not nervous/anxious.     CBC:  Lab Results  Component Value Date   WBC 9.1 10/20/2019   HGB 11.9 (L) 10/20/2019   HCT 34.6 (L) 10/20/2019   HCT 32.0 (L) 09/29/2017   MCH 30.7 05/03/2018   MCHC 34.2 10/20/2019   RDW 12.6 10/20/2019   PLT 353.0 10/20/2019   CMP: Lab Results  Component Value Date   NA 135 10/20/2019   K 3.6 10/20/2019   CL 97 10/20/2019   CO2 28 10/20/2019   ANIONGAP 13 05/03/2018   GLUCOSE 97 10/20/2019   BUN 12 10/20/2019   CREATININE 0.68 10/20/2019    GFRAA 59 (L) 05/03/2018   CALCIUM 9.8 10/20/2019   PROT 7.1 10/20/2019   BILITOT 0.3 10/20/2019   ALKPHOS 96 10/20/2019   ALT 13 10/20/2019   AST 17 10/20/2019   LIPID: Lab Results  Component Value Date   CHOL 184 10/20/2019   TRIG 71.0 10/20/2019   HDL 49.70 10/20/2019   LDLCALC 120 (H) 10/20/2019    Objective:  BP 100/72 (BP Location: Left Arm, Patient Position: Sitting, Cuff Size: Normal)   Pulse 81   Temp 97.6 F (36.4 C) (Temporal)   Ht 5' 6.5" (1.689 m)   [facility]  172 lb 11.2 oz (78.3 kg)   LMP 10/13/2019 (Exact Date)   BMI 27.46 kg/m   Weight: 172 lb 11.2 oz (78.3 kg)   BP Readings from Last 3 Encounters:  10/20/19 100/72  02/10/19 100/70  12/20/18 115/72   Wt Readings from Last 3 Encounters:  10/20/19 172 lb 11.2 oz (78.3 kg)  02/10/19 169 lb 1.6 oz (76.7 kg)  09/15/18 166 lb (75.3 kg)    Physical Exam Constitutional:      General: She is not in acute distress.    Appearance: She is well-developed.  HENT:     Head: Normocephalic and atraumatic.     Right Ear: External ear normal.     Left Ear: External ear normal.     Mouth/Throat:     Pharynx: No oropharyngeal exudate.  Eyes:     Conjunctiva/sclera: Conjunctivae normal.     Pupils: Pupils are equal, round, and reactive to light.  Neck:     Thyroid: No thyromegaly.  Cardiovascular:     Rate and Rhythm: Normal rate and regular rhythm.     Heart sounds: Normal heart sounds. No murmur. No friction rub. No gallop.   Pulmonary:     Effort: Pulmonary effort is normal.     Breath sounds: Normal breath sounds.  Abdominal:     General: Bowel sounds are normal. There is no distension.     Palpations: Abdomen is soft. There is no mass.     Tenderness: There is no abdominal tenderness. There is no guarding.     Hernia: No hernia is present.  Musculoskeletal:        General: No tenderness or deformity. Normal range of motion.     Cervical back: Normal range of motion and neck supple.  Lymphadenopathy:      Cervical: No cervical adenopathy.  Skin:    General: Skin is warm and dry.     Findings: No rash.  Neurological:     Mental Status: She is alert and oriented to person, place, and time.     Deep Tendon Reflexes: Reflexes normal.     Reflex Scores:      Tricep reflexes are 2+ on the right side and 2+ on the left side.      Bicep reflexes are 2+ on the right side and 2+ on the left side.      Brachioradialis reflexes are 2+ on the right side and 2+ on the left side.      Patellar reflexes are 2+ on the right side and 2+ on the left side. Psychiatric:        Speech: Speech normal.        Behavior: Behavior normal.        Thought Content: Thought content normal.     Assessment/Plan: Health Maintenance Due  Topic Date Due  . COLONOSCOPY  Never done  . PAP SMEAR-Modifier  09/22/2019   Health Maintenance reviewed -she agreed today for [place] to put in an order for mammogram so that she can get this scheduled, she agreed to a GI referral so that she can get colonoscopy completed, she is going to look into a gynecologist that she knows and will reach back out to me for referral.  She worries about seeing the gynecologist as this has been difficult for her in the past.  1. Preventative health care She agrees to referral for colonoscopy.  Reassured her about this procedure. - Ambulatory referral to Gastroenterology  2. Personal history of skin cancer  Referral placed for dermatology.  She has a history of skin cancer and should be seen yearly for follow-up. - Ambulatory referral to Dermatology  3. Essential hypertension Blood pressures been well controlled.  Continue current medications. - CBC with Differential/Platelet; Future - Comprehensive metabolic panel; Future - Comprehensive metabolic panel - CBC with Differential/Platelet  4. Gastroesophageal reflux disease without esophagitis She does well with AcipHex.  Continue this.  5. B12 deficiency - Vitamin B12; Future - Vitamin  B12  6. Lipid screening - Lipid panel; Future - Lipid panel  7. Encounter for screening mammogram for malignant neoplasm of breast - MM DIGITAL SCREENING BILATERAL; Future  8. Vitamin D deficiency - VITAMIN D 25 Hydroxy (Vit-D Deficiency, Fractures); Future - VITAMIN D 25 Hydroxy (Vit-D Deficiency, Fractures)  9. Weight gain - TSH; Future - Insulin, random; Future - Insulin, random - TSH  10. Iron deficiency - IBC + Ferritin; Future - IBC + Ferritin  11. Screening for diabetes mellitus - Hemoglobin A1c; Future - Hemoglobin A1c  Return in about 6 months (around 04/21/2020) for Chronic condition visit.  Micheline Rough, MD   She is interested in losing weight, we can discuss this in more detail pending blood work.

## 2019-10-24 ENCOUNTER — Other Ambulatory Visit: Payer: Self-pay | Admitting: Family Medicine

## 2019-10-24 LAB — INSULIN, RANDOM: Insulin: 9.6 u[IU]/mL

## 2019-10-24 MED ORDER — PREGABALIN 50 MG PO CAPS: 50.0000 mg | ORAL_CAPSULE | Freq: Three times a day (TID) | ORAL | 1 refills | Status: DC

## 2019-10-24 MED ORDER — ONDANSETRON HCL 4 MG PO TABS: 4.0000 mg | ORAL_TABLET | Freq: Three times a day (TID) | ORAL | 1 refills | Status: AC | PRN

## 2019-10-24 MED ORDER — EPINEPHRINE 0.3 MG/0.3ML IJ SOAJ: 0.3000 mg | INTRAMUSCULAR | 2 refills | Status: DC | PRN

## 2019-10-24 MED ORDER — CYCLOBENZAPRINE HCL 10 MG PO TABS
10.0000 mg | ORAL_TABLET | Freq: Three times a day (TID) | ORAL | 0 refills | Status: DC | PRN
  Filled 2020-09-27: qty 90, 30d supply, fill #0

## 2019-10-24 MED FILL — ONDANSETRON HCL 4 MG TABLET: 4 | 10 days supply | Qty: 30 | Fill #0

## 2019-10-24 MED FILL — EPINEPHRINE 0.3 MG AUTO-INJ: 0.3 | 2 days supply | Qty: 2 | Fill #0

## 2019-10-24 MED FILL — CYCLOBENZAPRINE HCL 10 MG T: 10 | 30 days supply | Qty: 90 | Fill #0

## 2019-10-24 MED FILL — PREGABALIN 50 MG CAPS: 50 | 90 days supply | Qty: 270 | Fill #0

## 2019-10-25 MED FILL — VALSARTAN 160 MG TABLET: 160 | 90 days supply | Qty: 90 | Fill #1

## 2019-10-30 ENCOUNTER — Telehealth: Payer: 59

## 2019-11-30 ENCOUNTER — Encounter: Payer: Self-pay | Admitting: Family Medicine

## 2019-11-30 ENCOUNTER — Other Ambulatory Visit: Payer: Self-pay

## 2019-11-30 ENCOUNTER — Ambulatory Visit: Payer: 59 | Admitting: Family Medicine

## 2019-11-30 VITALS — BP 124/82 | HR 116 | Temp 98.1°F | Ht 66.5 in | Wt 171.0 lb

## 2019-11-30 DIAGNOSIS — K5900 Constipation, unspecified: Secondary | ICD-10-CM

## 2019-11-30 DIAGNOSIS — N200 Calculus of kidney: Secondary | ICD-10-CM

## 2019-11-30 DIAGNOSIS — R109 Unspecified abdominal pain: Secondary | ICD-10-CM | POA: Diagnosis not present

## 2019-11-30 DIAGNOSIS — R300 Dysuria: Secondary | ICD-10-CM

## 2019-11-30 LAB — POCT URINALYSIS DIPSTICK
Bilirubin, UA: NEGATIVE
Blood, UA: POSITIVE
Glucose, UA: NEGATIVE
Ketones, UA: NEGATIVE
Leukocytes, UA: NEGATIVE
Nitrite, UA: NEGATIVE
Protein, UA: POSITIVE — AB
Spec Grav, UA: 1.025 (ref 1.010–1.025)
Urobilinogen, UA: 0.2 E.U./dL
pH, UA: 6 (ref 5.0–8.0)

## 2019-11-30 MED ORDER — TAMSULOSIN HCL 0.4 MG PO CAPS: 0.4000 mg | ORAL_CAPSULE | Freq: Every day | ORAL | 0 refills | Status: DC

## 2019-11-30 MED ORDER — TRAMADOL HCL 50 MG PO TABS: 50.0000 mg | ORAL_TABLET | Freq: Three times a day (TID) | ORAL | 0 refills | Status: AC | PRN

## 2019-11-30 MED ORDER — PHENAZOPYRIDINE HCL 100 MG PO TABS: 100.0000 mg | ORAL_TABLET | Freq: Three times a day (TID) | ORAL | 0 refills | Status: DC | PRN

## 2019-11-30 NOTE — Progress Notes (Signed)
Subjective:    Patient ID: Denise Campos, female    DOB: 18-Feb-1966, 54 y.o.   MRN: [id number]  Chief Complaint  Patient presents with  . Right flank pain    HPI Patient was seen today for acute concern.  Pt endorses right flank pain and dysuria since the weekend.  Pt notes h/o kidney stones.  Has noticed sediment in her urine.  Has a strainer at home.  States she drinks no water as it makes her nauseous.  Pt is taking ibuprofen for the pain.  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   Pt has h/o appendectomy.  Past Medical History:  Diagnosis Date  . Anemia 06/21/2017   -patient reports chronic, normal for her  . B12 deficiency 06/10/2017  . Cancer ([place])    basal cell carcinoma  . Endometriosis   . Essential hypertension 06/10/2017  . GERD (gastroesophageal reflux disease)   . History of stomach ulcers   . Hypertension   . Migraines   . Other chronic pain 06/10/2017  . UTI (urinary tract infection)     Allergies  Allergen Reactions  . Bee Venom Anaphylaxis  . Lisinopril     Didn't maintain bp well; didn't feel well  . Losartan     Nausea; but tolerates valsartan ok    ROS General: Denies fever, chills, night sweats, changes in weight, changes in appetite HEENT: Denies headaches, ear pain, changes in vision, rhinorrhea, sore throat CV: Denies CP, palpitations, SOB, orthopnea Pulm: Denies SOB, cough, wheezing GI: Denies abdominal pain, nausea, vomiting, diarrhea, constipation GU: Denies hematuria, frequency, vaginal discharge  + dysuria, sediment in urine Msk: Denies muscle cramps, joint pains  +R flank pain. Neuro: Denies weakness, numbness, tingling Skin: Denies rashes, bruising Psych: Denies depression, anxiety, hallucinations      Objective:    Blood pressure 124/82, pulse (!) 116, temperature 98.1 F (36.7 C), temperature source Temporal, height 5' 6.5" (1.689 m), weight 171 lb (77.6 kg), last menstrual period 11/09/2019, SpO2 99 %.  Gen.  Pleasant, well-nourished, hunched over, pacing room in apparent discomfort, normal affect   HEENT: [place]/AT, face symmetric,, no scleral icterus, PERRLA, EOMI, nares patent without drainage Lungs: no accessory muscle use Cardiovascular: RRR, no peripheral edema Musculoskeletal: +R CVA tenderness.  No deformities, no cyanosis or clubbing, normal tone Neuro:  A&Ox3, CN II-XII intact, slowed gait. Skin:  Warm, no lesions/ rash   Wt Readings from Last 3 Encounters:  11/30/19 171 lb (77.6 kg)  10/20/19 172 lb 11.2 oz (78.3 kg)  02/10/19 169 lb 1.6 oz (76.7 kg)    Lab Results  Component Value Date   WBC 9.1 10/20/2019   HGB 11.9 (L) 10/20/2019   HCT 34.6 (L) 10/20/2019   PLT 353.0 10/20/2019   GLUCOSE 97 10/20/2019   CHOL 184 10/20/2019   TRIG 71.0 10/20/2019   HDL 49.70 10/20/2019   LDLCALC 120 (H) 10/20/2019   ALT 13 10/20/2019   AST 17 10/20/2019   NA 135 10/20/2019   K 3.6 10/20/2019   CL 97 10/20/2019   CREATININE 0.68 10/20/2019   BUN 12 10/20/2019   CO2 28 10/20/2019   TSH 2.30 10/20/2019   HGBA1C 5.4 10/20/2019    Assessment/Plan:  Right flank pain  -2/2 renal calculi given history -UA with blood, protein, SG 1.025 -We will obtain urine culture -Patient encouraged to push p.o. fluids - Plan: POC Urinalysis Dipstick, Urine Culture  Kidney stone -Discussed straining urine.  Pt has strainer at home -Discussed  imaging to ensure no large stones present.  Pt declines CT stone study.  Will place order for renal ultrasound -increase po. Fluids -Given precautions - Plan: traMADol (ULTRAM) 50 MG tablet, tamsulosin (FLOMAX) 0.4 MG CAPS capsule  Dysuria  - Plan: UA, phenazopyridine (PYRIDIUM) 100 MG tablet  Constipation, unspecified type -Discussed increasing p.o. intake of fluids and fiber -Consider MiraLAX daily as needed  F/u with PCP as needed  Grier Mitts, MD

## 2019-11-30 NOTE — Patient Instructions (Signed)
Kidney Stones  Kidney stones are solid, rock-like deposits that form inside of the kidneys. The kidneys are a pair of organs that make urine. A kidney stone may form in a kidney and move into other parts of the urinary tract, including the tubes that connect the kidneys to the bladder (ureters), the bladder, and the tube that carries urine out of the body (urethra). As the stone moves through these areas, it can cause intense pain and block the flow of urine. Kidney stones are created when high levels of certain minerals are found in the urine. The stones are usually passed out of the body through urination, but in some cases, medical treatment may be needed to remove them. What are the causes? Kidney stones may be caused by:  A condition in which certain glands produce too much parathyroid hormone (primary hyperparathyroidism), which causes too much calcium buildup in the blood.  A buildup of uric acid crystals in the bladder (hyperuricosuria). Uric acid is a chemical that the body produces when you eat certain foods. It usually exits the body in the urine.  Narrowing (stricture) of one or both of the ureters.  A kidney blockage that is present at birth (congenital obstruction).  Past surgery on the kidney or the ureters, such as gastric bypass surgery. What increases the risk? The following factors may make you more likely to develop this condition:  Having had a kidney stone in the past.  Having a family history of kidney stones.  Not drinking enough water.  Eating a diet that is high in protein, salt (sodium), or sugar.  Being overweight or obese. What are the signs or symptoms? Symptoms of a kidney stone may include:  Pain in the side of the abdomen, right below the ribs (flank pain). Pain usually spreads (radiates) to the groin.  Needing to urinate frequently or urgently.  Painful urination.  Blood in the urine (hematuria).  Nausea.  Vomiting.  Fever and chills. How  is this diagnosed? This condition may be diagnosed based on:  Your symptoms and medical history.  A physical exam.  Blood tests.  Urine tests. These may be done before and after the stone passes out of your body through urination.  Imaging tests, such as a CT scan, abdominal X-ray, or ultrasound.  A procedure to examine the inside of the bladder (cystoscopy). How is this treated? Treatment for kidney stones depends on the size, location, and makeup of the stones. Kidney stones will often pass out of the body through urination. You may need to:  Increase your fluid intake to help pass the stone. In some cases, you may be given fluids through an IV and may need to be monitored at the hospital.  Take medicine for pain.  Make changes in your diet to help prevent kidney stones from coming back. Sometimes, medical procedures are needed to remove a kidney stone. This may involve:  A procedure to break up kidney stones using: ? A focused beam of light (laser therapy). ? Shock waves (extracorporeal shock wave lithotripsy).  Surgery to remove kidney stones. This may be needed if you have severe pain or have stones that block your urinary tract. Follow these instructions at home: Medicines  Take over-the-counter and prescription medicines only as told by your health care provider.  Ask your health care provider if the medicine prescribed to you requires you to avoid driving or using heavy machinery. Eating and drinking  Drink enough fluid to keep your urine pale yellow.   You may be instructed to drink at least 8-10 glasses of water each day. This will help you pass the kidney stone.  If directed, change your diet. This may include: ? Limiting how much sodium you eat. ? Eating more fruits and vegetables. ? Limiting how much animal protein--such as red meat, poultry, fish, and eggs--you eat.  Follow instructions from your health care provider about eating or drinking  restrictions. General instructions  Collect urine samples as told by your health care provider. You may need to collect a urine sample: ? 24 hours after you pass the stone. ? 8-12 weeks after passing the kidney stone, and every 6-12 months after that.  Strain your urine every time you urinate, for as long as directed. Use the strainer that your health care provider recommends.  Do not throw out the kidney stone after passing it. Keep the stone so it can be tested by your health care provider. Testing the makeup of your kidney stone may help prevent you from getting kidney stones in the future.  Keep all follow-up visits as told by your health care provider. This is important. You may need follow-up X-rays or ultrasounds to make sure that your stone has passed. How is this prevented? To prevent another kidney stone:  Drink enough fluid to keep your urine pale yellow. This is the best way to prevent kidney stones.  Eat a healthy diet and follow recommendations from your health care provider about foods to avoid. You may be instructed to eat a low-protein diet. Recommendations vary depending on the type of kidney stone that you have.  Maintain a healthy weight. Where to find more information  [place] [facility]): www.kidney.[facility]): www.urologyhealth.org Contact a health care provider if:  You have pain that gets worse or does not get better with medicine. Get help right away if:  You have a fever or chills.  You develop severe pain.  You develop new abdominal pain.  You faint.  You are unable to urinate. Summary  Kidney stones are solid, rock-like deposits that form inside of the kidneys.  Kidney stones can cause nausea, vomiting, blood in the urine, abdominal pain, and the urge to urinate frequently.  Treatment for kidney stones depends on the size, location, and makeup of the stones. Kidney stones will often pass out of the body  through urination.  Kidney stones can be prevented by drinking enough fluids, eating a healthy diet, and maintaining a healthy weight. This information is not intended to replace advice given to you by your health care provider. Make sure you discuss any questions you have with your health care provider. Document Revised: 09/27/2018 Document Reviewed: 09/27/2018 Elsevier Patient Education  [place].  Dysuria Dysuria is pain or discomfort while urinating. The pain or discomfort may be felt in the part of your body that drains urine from the bladder (urethra) or in the surrounding tissue of the genitals. The pain may also be felt in the groin area, lower abdomen, or lower back. You may have to urinate frequently or have the sudden feeling that you have to urinate (urgency). Dysuria can affect both men and women, but it is more common in women. Dysuria can be caused by many different things, including:  Urinary tract infection.  Kidney stones or bladder stones.  Certain sexually transmitted infections (STIs), such as chlamydia.  Dehydration.  Inflammation of the tissues of the vagina.  Use of certain medicines.  Use of certain  soaps or scented products that cause irritation. Follow these instructions at home: General instructions  Watch your condition for any changes.  Urinate often. Avoid holding urine for long periods of time.  After a bowel movement or urination, women should cleanse from front to back, using each tissue only once.  Urinate after sexual intercourse.  Keep all follow-up visits as told by your health care provider. This is important.  If you had any tests done to find the cause of dysuria, it is up to you to get your test results. Ask your health care provider, or the department that is doing the test, when your results will be ready. Eating and drinking   Drink enough fluid to keep your urine pale yellow.  Avoid caffeine, tea, and alcohol. They can  irritate the bladder and make dysuria worse. In men, alcohol may irritate the prostate. Medicines  Take over-the-counter and prescription medicines only as told by your health care provider.  If you were prescribed an antibiotic medicine, take it as told by your health care provider. Do not stop taking the antibiotic even if you start to feel better. Contact a health care provider if:  You have a fever.  You develop pain in your back or sides.  You have nausea or vomiting.  You have blood in your urine.  You are not urinating as often as you usually do. Get help right away if:  Your pain is severe and not relieved with medicines.  You cannot eat or drink without vomiting.  You are confused.  You have a rapid heartbeat while at rest.  You have shaking or chills.  You feel extremely weak. Summary  Dysuria is pain or discomfort while urinating. Many different conditions can lead to dysuria.  If you have dysuria, you may have to urinate frequently or have the sudden feeling that you have to urinate (urgency).  Watch your condition for any changes. Keep all follow-up visits as told by your health care provider.  Make sure that you urinate often and drink enough fluid to keep your urine pale yellow. This information is not intended to replace advice given to you by your health care provider. Make sure you discuss any questions you have with your health care provider. Document Revised: 04/23/2017 Document Reviewed: 02/25/2017 Elsevier Patient Education  2020 [place].  Dietary Guidelines to Help Prevent Kidney Stones Kidney stones are deposits of minerals and salts that form inside your kidneys. Your risk of developing kidney stones may be greater depending on your diet, your lifestyle, the medicines you take, and whether you have certain medical conditions. Most people can reduce their chances of developing kidney stones by following the instructions below. Depending on  your overall health and the type of kidney stones you tend to develop, your dietitian may give you more specific instructions. What are tips for following this plan? Reading food labels  Choose foods with "no salt added" or "low-salt" labels. Limit your sodium intake to less than 1500 mg per day.  Choose foods with calcium for each meal and snack. Try to eat about 300 mg of calcium at each meal. Foods that contain 200-500 mg of calcium per serving include: ? 8 oz (237 ml) of milk, fortified nondairy milk, and fortified fruit juice. ? 8 oz (237 ml) of kefir, yogurt, and soy yogurt. ? 4 oz (118 ml) of tofu. ? 1 oz of cheese. ? 1 cup (300 g) of dried figs. ? 1 cup (91 g) of  cooked broccoli. ? 1-3 oz can of sardines or mackerel.  Most people need 1000 to 1500 mg of calcium each day. Talk to your dietitian about how much calcium is recommended for you. Shopping  Buy plenty of fresh fruits and vegetables. Most people do not need to avoid fruits and vegetables, even if they contain nutrients that may contribute to kidney stones.  When shopping for convenience foods, choose: ? Whole pieces of fruit. ? Premade salads with dressing on the side. ? Low-fat fruit and yogurt smoothies.  Avoid buying frozen meals or prepared deli foods.  Look for foods with live cultures, such as yogurt and kefir. Cooking  Do not add salt to food when cooking. Place a salt shaker on the table and allow each person to add his or her own salt to taste.  Use vegetable protein, such as beans, textured vegetable protein (TVP), or tofu instead of meat in pasta, casseroles, and soups. Meal planning   Eat less salt, if told by your dietitian. To do this: ? Avoid eating processed or premade food. ? Avoid eating fast food.  Eat less animal protein, including cheese, meat, poultry, or fish, if told by your dietitian. To do this: ? Limit the number of times you have meat, poultry, fish, or cheese each week. Eat a diet  free of meat at least 2 days a week. ? Eat only one serving each day of meat, poultry, fish, or seafood. ? When you prepare animal protein, cut pieces into small portion sizes. For most meat and fish, one serving is about the size of one deck of cards.  Eat at least 5 servings of fresh fruits and vegetables each day. To do this: ? Keep fruits and vegetables on hand for snacks. ? Eat 1 piece of fruit or a handful of berries with breakfast. ? Have a salad and fruit at lunch. ? Have two kinds of vegetables at dinner.  Limit foods that are high in a substance called oxalate. These include: ? Spinach. ? Rhubarb. ? Beets. ? Potato chips and french fries. ? Nuts.  If you regularly take a diuretic medicine, make sure to eat at least 1-2 fruits or vegetables high in potassium each day. These include: ? Avocado. ? Banana. ? Orange, prune, carrot, or tomato juice. ? Baked potato. ? Cabbage. ? Beans and split peas. General instructions   Drink enough fluid to keep your urine clear or pale yellow. This is the most important thing you can do.  Talk to your health care provider and dietitian about taking daily supplements. Depending on your health and the cause of your kidney stones, you may be advised: ? Not to take supplements with vitamin C. ? To take a calcium supplement. ? To take a daily probiotic supplement. ? To take other supplements such as magnesium, fish oil, or vitamin B6.  Take all medicines and supplements as told by your health care provider.  Limit alcohol intake to no more than 1 drink a day for nonpregnant women and 2 drinks a day for men. One drink equals 12 oz of beer, 5 oz of wine, or 1 oz of hard liquor.  Lose weight if told by your health care provider. Work with your dietitian to find strategies and an eating plan that works best for you. What foods are not recommended? Limit your intake of the following foods, or as told by your dietitian. Talk to your dietitian  about specific foods you should avoid based on the  type of kidney stones and your overall health. Grains Breads. Bagels. Rolls. Baked goods. Salted crackers. Cereal. Pasta. Vegetables Spinach. Rhubarb. Beets. Canned vegetables. Angie Fava. Olives. Meats and other protein foods Nuts. Nut butters. Large portions of meat, poultry, or fish. Salted or cured meats. Deli meats. Hot dogs. Sausages. Dairy Cheese. Beverages Regular soft drinks. Regular vegetable juice. Seasonings and other foods Seasoning blends with salt. Salad dressings. Canned soups. Soy sauce. Ketchup. Barbecue sauce. Canned pasta sauce. Casseroles. Pizza. Lasagna. Frozen meals. Potato chips. [place] fries. Summary  You can reduce your risk of kidney stones by making changes to your diet.  The most important thing you can do is drink enough fluid. You should drink enough fluid to keep your urine clear or pale yellow.  Ask your health care provider or dietitian how much protein from animal sources you should eat each day, and also how much salt and calcium you should have each day. This information is not intended to replace advice given to you by your health care provider. Make sure you discuss any questions you have with your health care provider. Document Revised: 08/31/2018 Document Reviewed: 04/21/2016 Elsevier Patient Education  2020 [employer].

## 2019-12-01 LAB — URINE CULTURE
MICRO NUMBER:: 10681059
Result:: NO GROWTH
SPECIMEN QUALITY:: ADEQUATE

## 2019-12-07 ENCOUNTER — Ambulatory Visit
Admission: RE | Admit: 2019-12-07 | Discharge: 2019-12-07 | Disposition: A | Discharge status: Home / Self Care | Payer: 59 | Source: Ambulatory Visit | Attending: Family Medicine | Admitting: Family Medicine

## 2019-12-07 ENCOUNTER — Other Ambulatory Visit: Payer: Self-pay

## 2019-12-07 DIAGNOSIS — N200 Calculus of kidney: Secondary | ICD-10-CM

## 2019-12-07 DIAGNOSIS — R109 Unspecified abdominal pain: Secondary | ICD-10-CM

## 2019-12-18 ENCOUNTER — Other Ambulatory Visit: Payer: Self-pay | Admitting: Family Medicine

## 2019-12-19 MED FILL — POTASSIUM CHLORIDE CRYS ER: 20 | 90 days supply | Qty: 270 | Fill #0

## 2019-12-19 MED FILL — IBUPROFEN 800 MG TABS: 800 | 90 days supply | Qty: 270 | Fill #0

## 2019-12-20 ENCOUNTER — Encounter: Payer: Self-pay | Admitting: Family Medicine

## 2019-12-25 ENCOUNTER — Other Ambulatory Visit: Payer: Self-pay | Admitting: Family Medicine

## 2019-12-25 ENCOUNTER — Encounter: Payer: Self-pay | Admitting: Family Medicine

## 2019-12-25 MED ORDER — AMLODIPINE BESYLATE 2.5 MG PO TABS: 2.5000 mg | ORAL_TABLET | Freq: Every day | ORAL | 0 refills | Status: DC

## 2019-12-25 MED FILL — AMLODIPINE 2.5 MG TABLET: 2.5 | 90 days supply | Qty: 90 | Fill #0

## 2019-12-27 ENCOUNTER — Other Ambulatory Visit: Payer: Self-pay | Admitting: Family Medicine

## 2019-12-27 ENCOUNTER — Encounter: Payer: Self-pay | Admitting: Family Medicine

## 2019-12-27 MED FILL — RABEPRAZOLE SOD DR 20 MG TA: 20 | 90 days supply | Qty: 90 | Fill #0

## 2020-01-30 ENCOUNTER — Other Ambulatory Visit: Payer: Self-pay | Admitting: Family Medicine

## 2020-01-30 DIAGNOSIS — I10 Essential (primary) hypertension: Secondary | ICD-10-CM

## 2020-01-30 MED FILL — PREGABALIN 50 MG CAPS: 50 | 90 days supply | Qty: 270 | Fill #1

## 2020-01-30 MED FILL — VALSARTAN 160 MG TABLET: 160 | 90 days supply | Qty: 90 | Fill #0

## 2020-01-30 MED FILL — TRIAMTERENE-HCTZ 37.5-25 MG: 37.5-25 | 90 days supply | Qty: 90 | Fill #1

## 2020-01-31 ENCOUNTER — Telehealth: Payer: 59 | Admitting: Family

## 2020-01-31 DIAGNOSIS — R399 Unspecified symptoms and signs involving the genitourinary system: Secondary | ICD-10-CM

## 2020-01-31 MED ORDER — CEPHALEXIN 500 MG PO CAPS
500.0000 mg | ORAL_CAPSULE | Freq: Two times a day (BID) | ORAL | 0 refills | Status: DC
Start: 2020-01-31 — End: 2020-04-12

## 2020-01-31 MED FILL — CEPHALEXIN 500 MG CAPSULE: 500 | 7 days supply | Qty: 14 | Fill #0

## 2020-01-31 NOTE — Progress Notes (Signed)

## 2020-02-12 ENCOUNTER — Telehealth: Payer: 59 | Admitting: Family

## 2020-02-12 DIAGNOSIS — N390 Urinary tract infection, site not specified: Secondary | ICD-10-CM

## 2020-02-12 MED ORDER — SULFAMETHOXAZOLE-TRIMETHOPRIM 800-160 MG PO TABS: 1.0000 | ORAL_TABLET | Freq: Two times a day (BID) | ORAL | 0 refills | Status: DC

## 2020-02-12 MED FILL — SULFAMETHOXAZOLE-TMP DS TAB: 800-160 | 7 days supply | Qty: 14 | Fill #0

## 2020-02-12 NOTE — Progress Notes (Signed)
We are sorry that you are not feeling well.  Here is how we plan to help!  Based on what you shared with me it looks like you most likely have a simple urinary tract infection.  Please see below our treatment plan. We will go ahead and treat with another round of antibiotics, however, we usually require you to be seen face to face for a urine culture. If your symptoms, worsen or do not improve over the next 24-48 hours you need to be seen face to face to rule out a more serious infection and have urine culture completed.    A UTI (Urinary Tract Infection) is a bacterial infection of the bladder.  Most cases of urinary tract infections are simple to treat but a key part of your care is to encourage you to drink plenty of fluids and watch your symptoms carefully.  I have prescribed Bactrim DS One tablet twice a day for 7 days.  Your symptoms should gradually improve. Call us if the burning in your urine worsens, you develop worsening fever, back pain or pelvic pain or if your symptoms do not resolve after completing the antibiotic.  Urinary tract infections can be prevented by drinking plenty of water to keep your body hydrated.  Also be sure when you wipe, wipe from front to back and don't hold it in!  If possible, empty your bladder every 4 hours.  Your e-visit answers were reviewed by a board certified advanced clinical practitioner to complete your personal care plan.  Depending on the condition, your plan could have included both over the counter or prescription medications.  If there is a problem please reply  once you have received a response from your provider.  Your safety is important to [place].  If you have drug allergies check your prescription carefully.    You can use MyChart to ask questions about today's visit, request a non-urgent call back, or ask for a work or school excuse for 24 hours related to this e-Visit. If it has been greater than 24 hours you will need to follow up with your  provider, or enter a new e-Visit to address those concerns.   You will get an e-mail in the next two days asking about your experience.  I hope that your e-visit has been valuable and will speed your recovery. Thank you for using e-visits.  Approximately 5 minutes was spent documenting and reviewing patient's chart.

## 2020-03-16 ENCOUNTER — Telehealth: Payer: 59 | Admitting: Nurse Practitioner

## 2020-03-16 DIAGNOSIS — J029 Acute pharyngitis, unspecified: Secondary | ICD-10-CM

## 2020-03-16 NOTE — Progress Notes (Signed)

## 2020-03-26 MED FILL — RABEPRAZOLE SOD DR 20 MG TA: 20 | 90 days supply | Qty: 90 | Fill #1

## 2020-04-12 ENCOUNTER — Ambulatory Visit: Payer: 59 | Admitting: Family Medicine

## 2020-04-12 ENCOUNTER — Other Ambulatory Visit: Payer: Self-pay | Admitting: Family Medicine

## 2020-04-12 ENCOUNTER — Encounter: Payer: Self-pay | Admitting: Family Medicine

## 2020-04-12 ENCOUNTER — Other Ambulatory Visit: Payer: Self-pay

## 2020-04-12 VITALS — BP 116/80 | HR 44 | Temp 97.5°F | Ht 66.5 in | Wt 165.1 lb

## 2020-04-12 DIAGNOSIS — Z1159 Encounter for screening for other viral diseases: Secondary | ICD-10-CM | POA: Diagnosis not present

## 2020-04-12 DIAGNOSIS — M5126 Other intervertebral disc displacement, lumbar region: Secondary | ICD-10-CM

## 2020-04-12 DIAGNOSIS — G43809 Other migraine, not intractable, without status migrainosus: Secondary | ICD-10-CM

## 2020-04-12 DIAGNOSIS — K219 Gastro-esophageal reflux disease without esophagitis: Secondary | ICD-10-CM | POA: Diagnosis not present

## 2020-04-12 DIAGNOSIS — I10 Essential (primary) hypertension: Secondary | ICD-10-CM

## 2020-04-12 MED ORDER — TRIAMTERENE-HCTZ 37.5-25 MG PO TABS: 1.0000 | ORAL_TABLET | Freq: Every day | ORAL | 1 refills | Status: DC

## 2020-04-12 MED ORDER — AMLODIPINE BESYLATE 2.5 MG PO TABS: 2.5000 mg | ORAL_TABLET | Freq: Every day | ORAL | 1 refills | Status: DC

## 2020-04-12 MED ORDER — VALSARTAN 160 MG PO TABS: 160.0000 mg | ORAL_TABLET | Freq: Every day | ORAL | 1 refills | Status: DC

## 2020-04-12 MED ORDER — POTASSIUM CHLORIDE CRYS ER 20 MEQ PO TBCR: 20.0000 meq | EXTENDED_RELEASE_TABLET | Freq: Three times a day (TID) | ORAL | 1 refills | Status: DC

## 2020-04-12 MED ORDER — PREGABALIN 50 MG PO CAPS: 50.0000 mg | ORAL_CAPSULE | Freq: Three times a day (TID) | ORAL | 1 refills | Status: DC

## 2020-04-12 MED FILL — AMLODIPINE 2.5 MG TABLET: 2.5 | 90 days supply | Qty: 90 | Fill #0

## 2020-04-12 MED FILL — VALSARTAN 160 MG TABLET: 160 | 90 days supply | Qty: 90 | Fill #0

## 2020-04-12 MED FILL — TRIAMTERENE-HCTZ 37.5-25 MG: 37.5-25 | 90 days supply | Qty: 90 | Fill #0

## 2020-04-12 MED FILL — POTASSIUM CHLORIDE CRYS ER: 20 | 90 days supply | Qty: 270 | Fill #0

## 2020-04-12 NOTE — Progress Notes (Signed)
Denise Campos DOB: 24-Jan-1966 Encounter date: 04/12/2020  This is a 54 y.o. female who presents with Chief Complaint  Patient presents with  . Follow-up    History of present illness:  Doing well overall. Still needs COVID booster; but has done flu shot this year.  Last visit with me was May/2021 for preventative health care.she has been busy with work and driving to [employer] to take mom to chemo.   Seeing dentist Monday - hasn't done this in awhile.   Didn't set up with gynecology yet. Has two names and just trying to decide who she wants to see. Doesn't need referral.   Does have number for gastroenterology.   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   Hypertension: amlodipine 2.5mg , maxzide-25, valsartan 160.   Migraines: these have been doing ok. Has been 8 weeks since she had a period so this has been great.   GERD: aciphex controls this.   Back pain and knee pain: continues with back pain and knee pain.   Excited that she has lost weight today.  Allergies  Allergen Reactions  . Bee Venom Anaphylaxis  . Lisinopril     Didn't maintain bp well; didn't feel well  . Losartan     Nausea; but tolerates valsartan ok   Current Meds  Medication Sig  . amLODipine (NORVASC) 2.5 MG tablet Take 1 tablet (2.5 mg total) by mouth daily.  Marland Kitchen aspirin (ASPIRIN 81) 81 MG chewable tablet   . cephALEXin (KEFLEX) 500 MG capsule Take 1 capsule (500 mg total) by mouth 2 (two) times daily.  . cetirizine (ZYRTEC) 10 MG tablet Take 1 tablet (10 mg total) by mouth daily.  . Cholecalciferol (VITAMIN D3) 1000 units CAPS Take 5,000 Units by mouth daily.   Marland Kitchen CRANBERRY PO Take 8,400 mg by mouth daily.  . cyclobenzaprine (FLEXERIL) 10 MG tablet Take 1 tablet (10 mg total) by mouth 3 (three) times daily as needed for muscle spasms.  Marland Kitchen EPINEPHrine 0.3 mg/0.3 mL IJ SOAJ injection Inject 0.3 mLs (0.3 mg total) into the muscle as needed for  anaphylaxis.  . fluticasone (FLONASE) 50 MCG/ACT nasal spray Place 1 spray into both nostrils daily.  Marland Kitchen HYDROcodone-acetaminophen (NORCO/VICODIN) 5-325 MG tablet Take 1-2 tablets by mouth every 6 (six) hours as needed.  Marland Kitchen ibuprofen (ADVIL) 800 MG tablet TAKE 1 TABLET BY MOUTH EVERY 8 HOURS AS NEEDED.  . Magnesium 500 MG TABS Take 500 mg by mouth daily.  . naratriptan (AMERGE) 2.5 MG tablet Take 1 tablet (2.5 mg total) by mouth as needed for migraine. Take one (1) tablet at onset of headache; if returns or does not resolve, may repeat after 4 hours; do not exceed five (5) mg in 24 hours.  . norethindrone (MICRONOR) 0.35 MG tablet Take by mouth.  . ondansetron (ZOFRAN) 4 MG tablet Take 1 tablet (4 mg total) by mouth every 8 (eight) hours as needed for nausea or vomiting.  . phenazopyridine (PYRIDIUM) 100 MG tablet Take 1 tablet (100 mg total) by mouth 3 (three) times daily as needed for pain.  . potassium chloride SA (KLOR-CON) 20 MEQ tablet TAKE 1 TABLET (20 MEQ TOTAL) BY MOUTH 3 (THREE) TIMES DAILY.  Marland Kitchen pregabalin (LYRICA) 50 MG capsule Take 1 capsule (50 mg total) by mouth 3 (three) times daily.  . RABEprazole (ACIPHEX) 20 MG tablet TAKE 1 TABLET (20 MG TOTAL) BY MOUTH DAILY.  Marland Kitchen Saccharomyces boulardii (FLORASTOR PO) Take by mouth daily.  Marland Kitchen  sulfamethoxazole-trimethoprim (BACTRIM DS) 800-160 MG tablet Take 1 tablet by mouth 2 (two) times daily.  . tamsulosin (FLOMAX) 0.4 MG CAPS capsule Take 1 capsule (0.4 mg total) by mouth daily.  Marland Kitchen triamterene-hydrochlorothiazide (MAXZIDE-25) 37.5-25 MG tablet TAKE 1 TABLET BY MOUTH AT BEDTIME.  . valsartan (DIOVAN) 160 MG tablet TAKE 1 TABLET (160 MG TOTAL) BY MOUTH DAILY.  . vitamin B-12 (CYANOCOBALAMIN) 1000 MCG tablet Take 1,000 mcg by mouth daily. Take 1 tablet by mouth every 3 days    Review of Systems  Constitutional: Negative for chills, fatigue and fever.  Respiratory: Negative for cough, chest tightness, shortness of breath and wheezing.    Cardiovascular: Negative for chest pain, palpitations and leg swelling.    Objective:  BP 116/80 (BP Location: Left Arm, Patient Position: Sitting, Cuff Size: Large)   Pulse (!) 44   Temp (!) 97.5 F (36.4 C) (Oral)   Ht 5' 6.5" (1.689 m)   Wt 165 lb 1.6 oz (74.9 kg)   BMI 26.25 kg/m   Weight: 165 lb 1.6 oz (74.9 kg)   BP Readings from Last 3 Encounters:  04/12/20 116/80  11/30/19 124/82  10/20/19 100/72   Wt Readings from Last 3 Encounters:  04/12/20 165 lb 1.6 oz (74.9 kg)  11/30/19 171 lb (77.6 kg)  10/20/19 172 lb 11.2 oz (78.3 kg)    Physical Exam Constitutional:      General: She is not in acute distress.    Appearance: She is well-developed.  Cardiovascular:     Rate and Rhythm: Normal rate and regular rhythm.     Heart sounds: Normal heart sounds. No murmur heard.  No friction rub.  Pulmonary:     Effort: Pulmonary effort is normal. No respiratory distress.     Breath sounds: Normal breath sounds. No wheezing or rales.  Musculoskeletal:     Right lower leg: No edema.     Left lower leg: No edema.  Neurological:     Mental Status: She is alert and oriented to person, place, and time.  Psychiatric:        Behavior: Behavior normal.     Assessment/Plan  1. Essential hypertension Well controlled. Continue current medication. - CBC with Differential/Platelet; Future - Comprehensive metabolic panel; Future - valsartan (DIOVAN) 160 MG tablet; Take 1 tablet (160 mg total) by mouth daily.  Dispense: 90 tablet; Refill: 1 - triamterene-hydrochlorothiazide (MAXZIDE-25) 37.5-25 MG tablet; Take 1 tablet by mouth at bedtime.  Dispense: 90 tablet; Refill: 1 - potassium chloride SA (KLOR-CON) 20 MEQ tablet; Take 1 tablet (20 mEq total) by mouth 3 (three) times daily.  Dispense: 270 tablet; Refill: 1 - amLODipine (NORVASC) 2.5 MG tablet; Take 1 tablet (2.5 mg total) by mouth daily.  Dispense: 90 tablet; Refill: 1  2. Other migraine without status migrainosus, not  intractable Doing better now that she is slowing down on periods.   3. Lumbar disc herniation lyrica controls back pain.  - pregabalin (LYRICA) 50 MG capsule; Take 1 capsule (50 mg total) by mouth 3 (three) times daily.  Dispense: 270 capsule; Refill: 1  4. Gastroesophageal reflux disease without esophagitis Stable with aciphex. Continue current medication.  5. Encounter for hepatitis C screening test for low risk patient - Hepatitis C antibody; Future   Return in about 6 months (around 10/10/2020) for physical exam.    Micheline Rough, MD

## 2020-04-12 NOTE — Patient Instructions (Signed)
Schedule colonoscopy with GI Set up visit with gynecology

## 2020-04-12 NOTE — Addendum Note (Signed)
Addended by: Marrion Coy on: 04/12/2020 09:38 AM   Modules accepted: Orders

## 2020-04-15 LAB — COMPREHENSIVE METABOLIC PANEL
AG Ratio: 1.9 (calc) (ref 1.0–2.5)
ALT: 14 U/L (ref 6–29)
AST: 17 U/L (ref 10–35)
Albumin: 4.5 g/dL (ref 3.6–5.1)
Alkaline phosphatase (APISO): 94 U/L (ref 37–153)
BUN: 18 mg/dL (ref 7–25)
CO2: 23 mmol/L (ref 20–32)
Calcium: 9.9 mg/dL (ref 8.6–10.4)
Chloride: 98 mmol/L (ref 98–110)
Creat: 0.73 mg/dL (ref 0.50–1.05)
Globulin: 2.4 g/dL (calc) (ref 1.9–3.7)
Glucose, Bld: 96 mg/dL (ref 65–99)
Potassium: 4 mmol/L (ref 3.5–5.3)
Sodium: 135 mmol/L (ref 135–146)
Total Bilirubin: 0.2 mg/dL (ref 0.2–1.2)
Total Protein: 6.9 g/dL (ref 6.1–8.1)

## 2020-04-15 LAB — CBC WITH DIFFERENTIAL/PLATELET
Absolute Monocytes: 461 cells/uL (ref 200–950)
Basophils Absolute: 52 cells/uL (ref 0–200)
Basophils Relative: 0.6 %
Eosinophils Absolute: 235 cells/uL (ref 15–500)
Eosinophils Relative: 2.7 %
HCT: 36.4 % (ref 35.0–45.0)
Hemoglobin: 11.8 g/dL (ref 11.7–15.5)
Lymphs Abs: 5046 cells/uL — ABNORMAL HIGH (ref 850–3900)
MCH: 30.2 pg (ref 27.0–33.0)
MCHC: 32.4 g/dL (ref 32.0–36.0)
MCV: 93.1 fL (ref 80.0–100.0)
MPV: 10.3 fL (ref 7.5–12.5)
Monocytes Relative: 5.3 %
Neutro Abs: 2906 cells/uL (ref 1500–7800)
Neutrophils Relative %: 33.4 %
Platelets: 388 10*3/uL (ref 140–400)
RBC: 3.91 10*6/uL (ref 3.80–5.10)
RDW: 12 % (ref 11.0–15.0)
Total Lymphocyte: 58 %
WBC: 8.7 10*3/uL (ref 3.8–10.8)

## 2020-04-15 LAB — HEPATITIS C ANTIBODY
Hepatitis C Ab: NONREACTIVE
SIGNAL TO CUT-OFF: 0.01 (ref <1.00)

## 2020-04-30 ENCOUNTER — Encounter: Payer: Self-pay | Admitting: Family Medicine

## 2020-05-28 ENCOUNTER — Emergency Department (HOSPITAL_COMMUNITY)
Admission: EM | Admit: 2020-05-28 | Discharge: 2020-05-29 | Disposition: A | Discharge status: Home / Self Care | Payer: 59 | Attending: Emergency Medicine | Admitting: Emergency Medicine

## 2020-05-28 ENCOUNTER — Other Ambulatory Visit: Payer: Self-pay

## 2020-05-28 DIAGNOSIS — R42 Dizziness and giddiness: Secondary | ICD-10-CM | POA: Insufficient documentation

## 2020-05-28 DIAGNOSIS — Z5321 Procedure and treatment not carried out due to patient leaving prior to being seen by health care provider: Secondary | ICD-10-CM | POA: Insufficient documentation

## 2020-05-28 LAB — BASIC METABOLIC PANEL
Anion gap: 13 (ref 5–15)
BUN: 17 mg/dL (ref 6–20)
CO2: 20 mmol/L — ABNORMAL LOW (ref 22–32)
Calcium: 9.6 mg/dL (ref 8.9–10.3)
Chloride: 101 mmol/L (ref 98–111)
Creatinine, Ser: 0.91 mg/dL (ref 0.44–1.00)
GFR, Estimated: >60 mL/min (ref >60)
Glucose, Bld: 126 mg/dL — ABNORMAL HIGH (ref 70–99)
Potassium: 3 mmol/L — ABNORMAL LOW (ref 3.5–5.1)
Sodium: 134 mmol/L — ABNORMAL LOW (ref 135–145)

## 2020-05-28 LAB — URINALYSIS, ROUTINE W REFLEX MICROSCOPIC
Bilirubin Urine: NEGATIVE
Glucose, UA: NEGATIVE mg/dL
Ketones, ur: NEGATIVE mg/dL
Nitrite: NEGATIVE
Protein, ur: NEGATIVE mg/dL
Specific Gravity, Urine: 1.019 (ref 1.005–1.030)
pH: 5 (ref 5.0–8.0)

## 2020-05-28 LAB — I-STAT BETA HCG BLOOD, ED (MC, WL, AP ONLY): I-stat hCG, quantitative: <5 m[IU]/mL (ref <5)

## 2020-05-28 LAB — CBC
HCT: 35.2 % — ABNORMAL LOW (ref 36.0–46.0)
Hemoglobin: 12.5 g/dL (ref 12.0–15.0)
MCH: 32.1 pg (ref 26.0–34.0)
MCHC: 35.5 g/dL (ref 30.0–36.0)
MCV: 90.3 fL (ref 80.0–100.0)
Platelets: 328 10*3/uL (ref 150–400)
RBC: 3.9 MIL/uL (ref 3.87–5.11)
RDW: 12.1 % (ref 11.5–15.5)
WBC: 11.1 10*3/uL — ABNORMAL HIGH (ref 4.0–10.5)
nRBC: 0 % (ref 0.0–0.2)

## 2020-05-28 NOTE — ED Triage Notes (Signed)
Patient c/o feeling like passing out. Stated its not vertigo but room is "spinning" with eyes closed.

## 2020-05-29 DIAGNOSIS — N390 Urinary tract infection, site not specified: Secondary | ICD-10-CM | POA: Diagnosis not present

## 2020-05-29 DIAGNOSIS — E876 Hypokalemia: Secondary | ICD-10-CM | POA: Diagnosis not present

## 2020-05-29 DIAGNOSIS — E861 Hypovolemia: Secondary | ICD-10-CM | POA: Diagnosis not present

## 2020-05-29 DIAGNOSIS — E860 Dehydration: Secondary | ICD-10-CM | POA: Diagnosis not present

## 2020-05-29 DIAGNOSIS — R55 Syncope and collapse: Secondary | ICD-10-CM | POA: Diagnosis not present

## 2020-05-29 DIAGNOSIS — Z20822 Contact with and (suspected) exposure to covid-19: Secondary | ICD-10-CM | POA: Diagnosis not present

## 2020-05-29 NOTE — ED Notes (Signed)
Pt stated she was leaving & would go to UC in the morning.

## 2020-06-01 DIAGNOSIS — R5381 Other malaise: Secondary | ICD-10-CM | POA: Diagnosis not present

## 2020-06-01 DIAGNOSIS — N390 Urinary tract infection, site not specified: Secondary | ICD-10-CM | POA: Diagnosis not present

## 2020-06-01 DIAGNOSIS — Z20822 Contact with and (suspected) exposure to covid-19: Secondary | ICD-10-CM | POA: Diagnosis not present

## 2020-06-06 ENCOUNTER — Ambulatory Visit: Payer: 59 | Admitting: Family Medicine

## 2020-06-06 ENCOUNTER — Encounter: Payer: Self-pay | Admitting: Family Medicine

## 2020-06-06 ENCOUNTER — Other Ambulatory Visit: Payer: Self-pay

## 2020-06-06 VITALS — Temp 98.0°F | Wt 165.6 lb

## 2020-06-06 DIAGNOSIS — R55 Syncope and collapse: Secondary | ICD-10-CM | POA: Diagnosis not present

## 2020-06-06 DIAGNOSIS — I9589 Other hypotension: Secondary | ICD-10-CM | POA: Diagnosis not present

## 2020-06-06 DIAGNOSIS — E876 Hypokalemia: Secondary | ICD-10-CM

## 2020-06-06 DIAGNOSIS — E861 Hypovolemia: Secondary | ICD-10-CM

## 2020-06-06 NOTE — Progress Notes (Signed)
Subjective:    Patient ID: Denise Campos, female    DOB: 06/16/1965, 55 y.o.   MRN: [id number]  No chief complaint on file.   HPI Patient was seen today for f/u.  Patient endorses diarrhea and emesis for 4 days starting on 12/28.  Pt endorses confusion, syncopal episode, and hypotension on 05/29/19 while in the shower.  Woke up on the bed, BP was 77/57.  Pt went to the ED, but LWBS 2/2 extended wait times.  Was seen at [facility] the next day, labs from ED reviewed, pt noted to be hypokalemic at 3.0, hyponatremic 134, and Hypocarbic at 20.  Patient given NS IV.  Seen again at UC days later, potassium improved.  Pt had another episode dizziness in the shower, bp was 80/50  Past Medical History:  Diagnosis Date  . Anemia 06/21/2017   -patient reports chronic, normal for her  . B12 deficiency 06/10/2017  . Cancer ([place])    basal cell carcinoma  . Endometriosis   . Essential hypertension 06/10/2017  . GERD (gastroesophageal reflux disease)   . History of stomach ulcers   . Hypertension   . Migraines   . Other chronic pain 06/10/2017  . UTI (urinary tract infection)     Allergies  Allergen Reactions  . Bee Venom Anaphylaxis  . Lisinopril     Didn't maintain bp well; didn't feel well  . Losartan     Nausea; but tolerates valsartan ok    ROS General: Denies fever, chills, night sweats, changes in weight, changes in appetite + syncope, hypotension, dizziness HEENT: Denies headaches, ear pain, changes in vision, rhinorrhea, sore throat CV: Denies CP, palpitations, SOB, orthopnea Pulm: Denies SOB, cough, wheezing GI: Denies abdominal pain, nausea, constipation +diarrhea, emesis GU: Denies dysuria, hematuria, frequency, vaginal discharge Msk: Denies muscle cramps, joint pains Neuro: Denies weakness, numbness, tingling Skin: Denies rashes, bruising Psych: Denies depression, anxiety, hallucinations     Objective:    Blood pressure 120/80, pulse 69, temperature 98 F (36.7 C), temperature source  Oral, weight 165 lb 9.6 oz (75.1 kg), SpO2 97 %.  Gen. Pleasant, well-nourished, in no distress, normal affect   HEENT: [place]/AT, face symmetric, conjunctiva clear, no scleral icterus, PERRLA, EOMI, nares patent without drainage, pharynx without erythema or exudate. Neck: No JVD, no thyromegaly, no carotid bruits Lungs: no accessory muscle use, CTAB, no wheezes or rales Cardiovascular: Faint heart sounds, RRR, no m/r/g, no peripheral edema Musculoskeletal: No deformities, no cyanosis or clubbing, normal tone Neuro:  A&Ox3, CN II-XII intact, normal gait Skin:  Warm, no lesions/ rash   Wt Readings from Last 3 Encounters:  06/06/20 165 lb 9.6 oz (75.1 kg)  05/28/20 160 lb (72.6 kg)  04/12/20 165 lb 1.6 oz (74.9 kg)    Lab Results  Component Value Date   WBC 11.1 (H) 05/28/2020   HGB 12.5 05/28/2020   HCT 35.2 (L) 05/28/2020   PLT 328 05/28/2020   GLUCOSE 126 (H) 05/28/2020   CHOL 184 10/20/2019   TRIG 71.0 10/20/2019   HDL 49.70 10/20/2019   LDLCALC 120 (H) 10/20/2019   ALT 14 04/12/2020   AST 17 04/12/2020   NA 134 (L) 05/28/2020   K 3.0 (L) 05/28/2020   CL 101 05/28/2020   CREATININE 0.91 05/28/2020   BUN 17 05/28/2020   CO2 20 (L) 05/28/2020   TSH 2.30 10/20/2019   HGBA1C 5.4 10/20/2019    Assessment/Plan:  Hypotension due to hypovolemia -Likely caused by dehydration from gastroenteritis and decreased fluid intake  at baseline -Orthostatics negative this visit -Patient encouraged to increase p.o. intake of water and fluids -Discussed holding Norvasc 2.5 mg or hydrochlorothiazide portion of his BP medication, however patient declines. -Patient encouraged to continue monitoring BP daily and keep a log to bring with her to clinic -Continue Norvasc 2.5 mg, valsartan 160 mg, triamterene-hydrochlorothiazide 37.5-25 mg daily, and Klor-Con 20 mEq 3 times daily -Repeat labs from 06/04/20 reviewed including CBC (WBC 7.9, H&H 13.6/40.3, Plts 406), CMP (creatinine 0.77, sodium 136,  potassium 4.1, CO2 25, calcium 10.4) TSH 1.33 vitamin B12 > 1442, folate> 24, vitamin D 84, and SARS-CoV-2 RNA not detected on 06/01/2020 -We will wait to repeat labs  Syncope, unspecified syncope type  -Likely 2/2 hypovolemia -Orthostatics negative this visit -Patient encouraged to increase p.o. intake of water and fluids -Discussed holding Norvasc 2.5 mg, however patient declines. -Follow-up with PCP  Hypokalemia -Improving -Continue potassium supplement 20 mEq 3 times daily  F/u prn with pcp in 1 month  Grier Mitts, MD

## 2020-06-06 NOTE — Patient Instructions (Signed)
Hypotension As your heart beats, it forces blood through your body. Hypotension, commonly called low blood pressure, is when the force of blood pumping through your arteries is too weak. Arteries are blood vessels that carry blood from the heart throughout the body. Depending on the cause and severity, hypotension may be harmless (benign) or may cause serious problems (be critical). When blood pressure is too low, you may not get enough blood to your brain or to the rest of your organs. This can cause weakness, light-headedness, rapid heartbeat, and fainting. What are the causes? This condition may be caused by:  Blood loss.  Loss of body fluids (dehydration).  Heart problems.  Hormone (endocrine) problems.  Pregnancy.  Severe infection.  Lack of certain nutrients.  Severe allergic reactions (anaphylaxis).  Certain medicines, such as blood pressure medicine or medicines that make the body lose excess fluids (diuretics). Sometimes, hypotension may be caused by not taking medicine as directed, such as taking too much of a certain medicine. What increases the risk? The following factors may make you more likely to develop this condition:  Age. Risk increases as you get older.  Conditions that affect the heart or the central nervous system.  Taking certain medicines, such as blood pressure medicine or diuretics.  Being pregnant. What are the signs or symptoms? Common symptoms of this condition include:  Weakness.  Light-headedness.  Dizziness.  Blurred vision.  Fatigue.  Rapid heartbeat.  Fainting, in severe cases. How is this diagnosed? This condition is diagnosed based on:  Your medical history.  Your symptoms.  Your blood pressure measurement. Your health care provider will check your blood pressure when you are: ? Lying down. ? Sitting. ? Standing. A blood pressure reading is recorded as two numbers, such as "120 over 80" (or 120/80). The first ("top")  number is called the systolic pressure. It is a measure of the pressure in your arteries as your heart beats. The second ("bottom") number is called the diastolic pressure. It is a measure of the pressure in your arteries when your heart relaxes between beats. Blood pressure is measured in a unit called mm Hg. Healthy blood pressure for most adults is 120/80. If your blood pressure is below 90/60, you may be diagnosed with hypotension. Other information or tests that may be used to diagnose hypotension include:  Your other vital signs, such as your heart rate and temperature.  Blood tests.  Tilt table test. For this test, you will be safely secured to a table that moves you from a lying position to an upright position. Your heart rhythm and blood pressure will be monitored during the test. How is this treated? Treatment for this condition may include:  Changing your diet. This may involve eating more salt (sodium) or drinking more water.  Taking medicines to raise your blood pressure.  Changing the dosage of certain medicines you are taking that might be lowering your blood pressure.  Wearing compression stockings. These stockings help to prevent blood clots and reduce swelling in your legs. In some cases, you may need to go to the hospital for:  Fluid replacement. This means you will receive fluids through an IV.  Blood replacement. This means you will receive donated blood through an IV (transfusion).  Treating an infection or heart problems, if this applies.  Monitoring. You may need to be monitored while medicines that you are taking wear off. Follow these instructions at home: Eating and drinking  Drink enough fluid to keep your urine  pale yellow.  Eat a healthy diet, and follow instructions from your health care provider about eating or drinking restrictions. A healthy diet includes: ? Fresh fruits and vegetables. ? Whole grains. ? Lean meats. ? Low-fat dairy  products.  Eat extra salt only as directed. Do not add extra salt to your diet unless your health care provider told you to do that.  Eat frequent, small meals.  Avoid standing up suddenly after eating.   Medicines  Take over-the-counter and prescription medicines only as told by your health care provider. ? Follow instructions from your health care provider about changing the dosage of your current medicines, if this applies. ? Do not stop or adjust any of your medicines on your own. General instructions  Wear compression stockings as told by your health care provider.  Get up slowly from lying down or sitting positions. This gives your blood pressure a chance to adjust.  Avoid hot showers and excessive heat as directed by your health care provider.  Return to your normal activities as told by your health care provider. Ask your health care provider what activities are safe for you.  Do not use any products that contain nicotine or tobacco, such as cigarettes, e-cigarettes, and chewing tobacco. If you need help quitting, ask your health care provider.  Keep all follow-up visits as told by your health care provider. This is important.   Contact a health care provider if you:  Vomit.  Have diarrhea.  Have a fever for more than 2-3 days.  Feel more thirsty than usual.  Feel weak and tired. Get help right away if you:  Have chest pain.  Have a fast or irregular heartbeat.  Develop numbness in any part of your body.  Cannot move your arms or your legs.  Have trouble speaking.  Become sweaty or feel light-headed.  Faint.  Feel short of breath.  Have trouble staying awake.  Feel confused. Summary  Hypotension is when the force of blood pumping through your arteries is too weak.  Hypotension may be harmless (benign) or may cause serious problems (be critical).  Treatment for this condition may include changing your diet, changing your medicines, and wearing  compression stockings.  In some cases, you may need to go to the hospital for fluid or blood replacement. This information is not intended to replace advice given to you by your health care provider. Make sure you discuss any questions you have with your health care provider. Document Revised: 11/04/2017 Document Reviewed: 11/04/2017 Elsevier Patient Education  2021 [place].  Hypokalemia Hypokalemia means that the amount of potassium in the blood is lower than normal. Potassium is a chemical (electrolyte) that helps regulate the amount of fluid in the body. It also stimulates muscle tightening (contraction) and helps nerves work properly. Normally, most of the body's potassium is inside cells, and only a very small amount is in the blood. Because the amount in the blood is so small, minor changes to potassium levels in the blood can be life-threatening. What are the causes? This condition may be caused by:  Antibiotic medicine.  Diarrhea or vomiting. Taking too much of a medicine that helps you have a bowel movement (laxative) can cause diarrhea and lead to hypokalemia.  Chronic kidney disease (CKD).  Medicines that help the body get rid of excess fluid (diuretics).  Eating disorders, such as bulimia.  Low magnesium levels in the body.  Sweating a lot. What are the signs or symptoms? Symptoms of this  condition include:  Weakness.  Constipation.  Fatigue.  Muscle cramps.  Mental confusion.  Skipped heartbeats or irregular heartbeat (palpitations).  Tingling or numbness. How is this diagnosed? This condition is diagnosed with a blood test. How is this treated? This condition may be treated by:  Taking potassium supplements by mouth.  Adjusting the medicines that you take.  Eating more foods that contain a lot of potassium. If your potassium level is very low, you may need to get potassium through an IV and be monitored in the hospital. Follow these  instructions at home:  Take over-the-counter and prescription medicines only as told by your health care provider. This includes vitamins and supplements.  Eat a healthy diet. A healthy diet includes fresh fruits and vegetables, whole grains, healthy fats, and lean proteins.  If instructed, eat more foods that contain a lot of potassium. This includes: ? Nuts, such as peanuts and pistachios. ? Seeds, such as sunflower seeds and pumpkin seeds. ? Peas, lentils, and lima beans. ? Whole grain and bran cereals and breads. ? Fresh fruits and vegetables, such as apricots, avocado, bananas, cantaloupe, kiwi, oranges, tomatoes, asparagus, and potatoes. ? Orange juice. ? Tomato juice. ? Red meats. ? Yogurt.  Keep all follow-up visits as told by your health care provider. This is important.   Contact a health care provider if you:  Have weakness that gets worse.  Feel your heart pounding or racing.  Vomit.  Have diarrhea.  Have diabetes (diabetes mellitus) and you have trouble keeping your blood sugar (glucose) in your target range. Get help right away if you:  Have chest pain.  Have shortness of breath.  Have vomiting or diarrhea that lasts for more than 2 days.  Faint. Summary  Hypokalemia means that the amount of potassium in the blood is lower than normal.  This condition is diagnosed with a blood test.  Hypokalemia may be treated by taking potassium supplements, adjusting the medicines that you take, or eating more foods that are high in potassium.  If your potassium level is very low, you may need to get potassium through an IV and be monitored in the hospital. This information is not intended to replace advice given to you by your health care provider. Make sure you discuss any questions you have with your health care provider. Document Revised: 12/22/2017 Document Reviewed: 12/22/2017 Elsevier Patient Education  2021 [place].  Syncope  Syncope refers to a  condition in which a person temporarily loses consciousness. Syncope may also be called fainting or passing out. It is caused by a sudden decrease in blood flow to the brain. Even though most causes of syncope are not dangerous, syncope can be a sign of a serious medical problem. Your health care provider may do tests to find the reason why you are having syncope. Signs that you may be about to faint include:  Feeling dizzy or light-headed.  Feeling nauseous.  Seeing all white or all black in your field of vision.  Having cold, clammy skin. If you faint, get medical help right away. Call your local emergency services (911 in the [place] Do not drive yourself to the hospital. Follow these instructions at home: Pay attention to any changes in your symptoms. Take these actions to stay safe and to help relieve your symptoms: Lifestyle  Do not drive, use machinery, or play sports until your health care provider says it is okay.  Do not drink alcohol.  Do not use any products that contain  nicotine or tobacco, such as cigarettes and e-cigarettes. If you need help quitting, ask your health care provider.  Drink enough fluid to keep your urine pale yellow. General instructions  Take over-the-counter and prescription medicines only as told by your health care provider.  If you are taking blood pressure or heart medicine, get up slowly and take several minutes to sit and then stand. This can reduce dizziness or light-headedness.  Have someone stay with you until you feel stable.  If you start to feel like you might faint, lie down right away and raise (elevate) your feet above the level of your heart. Breathe deeply and steadily. Wait until all the symptoms have passed.  Keep all follow-up visits as told by your health care provider. This is important. Get help right away if you:  Have a severe headache.  Faint once or repeatedly.  Have pain in your chest, abdomen, or back.  Have a very  fast or irregular heartbeat (palpitations).  Have pain when you breathe.  Are bleeding from your mouth or rectum, or you have black or tarry stool.  Have a seizure.  Are confused.  Have trouble walking.  Have severe weakness.  Have vision problems. These symptoms may represent a serious problem that is an emergency. Do not wait to see if your symptoms will go away. Get medical help right away. Call your local emergency services (911 in the [place] Do not drive yourself to the hospital. Summary  Syncope refers to a condition in which a person temporarily loses consciousness. It is caused by a sudden decrease in blood flow to the brain.  Signs that you may be about to faint include dizziness, feeling light-headed, feeling nauseous, sudden vision changes, or cold, clammy skin.  Although most causes of syncope are not dangerous, syncope can be a sign of a serious medical problem. If you faint, get medical help right away. This information is not intended to replace advice given to you by your health care provider. Make sure you discuss any questions you have with your health care provider. Document Revised: 09/21/2019 Document Reviewed: 09/21/2019 Elsevier Patient Education  2021 [employer].

## 2020-06-25 ENCOUNTER — Other Ambulatory Visit: Payer: Self-pay | Admitting: Family Medicine

## 2020-06-25 ENCOUNTER — Encounter: Payer: Self-pay | Admitting: Family Medicine

## 2020-06-26 ENCOUNTER — Other Ambulatory Visit: Payer: Self-pay | Admitting: Family Medicine

## 2020-06-26 MED ORDER — RABEPRAZOLE SODIUM 20 MG PO TBEC: 20.0000 mg | DELAYED_RELEASE_TABLET | Freq: Every day | ORAL | 1 refills | Status: DC

## 2020-06-26 MED FILL — RABEPRAZOLE SOD DR 20 MG TA: 20 | 90 days supply | Qty: 90 | Fill #0

## 2020-07-15 ENCOUNTER — Ambulatory Visit: Payer: 59 | Admitting: Family Medicine

## 2020-07-17 MED FILL — AMLODIPINE 2.5 MG TABLET: 2.5 | 90 days supply | Qty: 90 | Fill #1

## 2020-07-23 ENCOUNTER — Encounter: Payer: Self-pay | Admitting: Family Medicine

## 2020-07-23 DIAGNOSIS — M5126 Other intervertebral disc displacement, lumbar region: Secondary | ICD-10-CM

## 2020-07-23 MED FILL — VALSARTAN 160 MG TABLET: 160 | 90 days supply | Qty: 90 | Fill #1

## 2020-07-23 MED FILL — PREGABALIN 50 MG CAPS: 50 | 90 days supply | Qty: 270 | Fill #0

## 2020-07-23 MED FILL — TRIAMTERENE-HCTZ 37.5-25 MG: 37.5-25 | 90 days supply | Qty: 90 | Fill #1

## 2020-07-25 ENCOUNTER — Other Ambulatory Visit: Payer: Self-pay | Admitting: Family Medicine

## 2020-07-25 MED ORDER — PREGABALIN 50 MG PO CAPS: 50.0000 mg | ORAL_CAPSULE | Freq: Three times a day (TID) | ORAL | 1 refills | Status: DC

## 2020-07-29 MED FILL — POTASSIUM CHLORIDE CRYS ER: 20 | 90 days supply | Qty: 270 | Fill #1

## 2020-08-09 ENCOUNTER — Telehealth: Payer: 59 | Admitting: Physician Assistant

## 2020-08-09 DIAGNOSIS — B001 Herpesviral vesicular dermatitis: Secondary | ICD-10-CM

## 2020-08-09 MED ORDER — ACYCLOVIR 800 MG PO TABS: 800.0000 mg | ORAL_TABLET | Freq: Two times a day (BID) | ORAL | 0 refills | Status: AC

## 2020-08-09 NOTE — Progress Notes (Signed)
We are sorry that you are not feeling well.  Here is how we plan to help! ? ?Based on what you have shared with me it does look like you have a viral infection.   ? ?Most cold sores or fever blisters are small fluid filled blisters around the mouth caused by herpes simplex virus.  The most common strain of the virus causing cold sores is herpes simplex virus 1.  It can be spread by skin contact, sharing eating utensils, or even sharing towels.  Cold sores are contagious to other people until dry. (Approximately 5-7 days).  Wash your hands. You can spread the virus to your eyes through handling your contact lenses after touching the lesions. ? ?Most people experience pain at the sight or tingling sensations in their lips that may begin before the ulcers erupt. ? ?Herpes simplex is treatable but not curable.  It may lie dormant for a long time and then reappear due to stress or prolonged sun exposure.  Many patients have success in treating their cold sores with an over the counter topical called Abreva.  You may apply the cream up to 5 times daily (maximum 10 days) until healing occurs. ? ?If you would like to use an oral antiviral medication to speed the healing of your cold sore, I have sent a prescription to your local pharmacy Acyclovir 800 mg take one by mouth twice a day for 7 days   ? ?HOME CARE: ? ?Wash your hands frequently. ?Do not pick at or rub the sore. ?Don't open the blisters. ?Avoid kissing other people during this time. ?Avoid sharing drinking glasses, eating utensils, or razors. ?Do not handle contact lenses unless you have thoroughly washed your hands with soap and warm water! ?Avoid oral sex during this time.  Herpes from sores on your mouth can spread to your partner's genital area. ?Avoid contact with anyone who has eczema or a weakened immune system. ?Cold sores are often triggered by exposure to intense sunlight, use a lip balm containing a sunscreen (SPF 30 or higher). ? ?GET HELP RIGHT AWAY  IF: ? ?Blisters look infected. ?Blisters occur near or in the eye. ?Symptoms last longer than 10 days. ?Your symptoms become worse. ? ?MAKE SURE YOU: ? ?Understand these instructions. ?Will watch your condition. ?Will get help right away if you are not doing well or get worse. ? ?  ?Your e-visit answers were reviewed by a board certified advanced clinical practitioner to complete your personal care plan.  Depending upon the condition, your plan could have  Included both over the counter or prescription medications.   ? ?Please review your pharmacy choice.  Be sure that the pharmacy you have chosen is open so that you can pick up your prescription now.  If there is a problem you can message your provider in MyChart to have the prescription routed to another pharmacy.   ? ?Your safety is important to us.  If you have drug allergies check our prescription carefully. ? ?For the next 24 hours you can use MyChart to ask questions about today's visit, request a non-urgent call back, or ask for a work or school excuse from your e-visit provider. ? ?You will get an email in the next two days asking about your experience.  I hope that your e-visit has been valuable and will speed your recovery. ? ?

## 2020-08-09 NOTE — Progress Notes (Signed)
I have spent 5 minutes in review of e-visit questionnaire, review and updating patient chart, medical decision making and response to patient.   Paola Aleshire Cody Gardenia Witter, PA-C    

## 2020-08-12 ENCOUNTER — Ambulatory Visit: Payer: 59 | Admitting: Family Medicine

## 2020-09-17 DIAGNOSIS — J029 Acute pharyngitis, unspecified: Secondary | ICD-10-CM | POA: Diagnosis not present

## 2020-09-17 DIAGNOSIS — U071 COVID-19: Secondary | ICD-10-CM | POA: Diagnosis not present

## 2020-09-22 MED FILL — Rabeprazole Sodium EC Tab 20 MG: ORAL | 90 days supply | Qty: 90 | Fill #0 | Status: AC

## 2020-09-23 ENCOUNTER — Other Ambulatory Visit (HOSPITAL_COMMUNITY): Payer: Self-pay

## 2020-09-26 ENCOUNTER — Telehealth: Payer: Self-pay | Admitting: Family Medicine

## 2020-09-26 ENCOUNTER — Encounter: Payer: Self-pay | Admitting: Family Medicine

## 2020-09-26 DIAGNOSIS — M5126 Other intervertebral disc displacement, lumbar region: Secondary | ICD-10-CM

## 2020-09-26 DIAGNOSIS — I10 Essential (primary) hypertension: Secondary | ICD-10-CM

## 2020-09-26 NOTE — Telephone Encounter (Signed)
Pt is calling in stating that she is in the process of [place] out [place] and will be needing to get her blood pressure medications refilled so that she does not run out.  Rx's needed are amlodipine (NORVASC) 2.5 MG, triamterene-hydrochlorothiazide (MAXZIDE-25) 37.5 -25 MG and valsartan (DIOVAN) 160 MG.  She is wanting to see if she can get these refilled and cancel her CPE if she is not able to get the CPE done before she leaves b/c she is needing blood work.  Pt would like to have a call back to let her know what she can do about getting her CPE moved up and medications refilled.

## 2020-09-27 ENCOUNTER — Other Ambulatory Visit (HOSPITAL_COMMUNITY): Payer: Self-pay

## 2020-09-27 DIAGNOSIS — R0600 Dyspnea, unspecified: Secondary | ICD-10-CM | POA: Diagnosis not present

## 2020-09-27 DIAGNOSIS — U099 Post covid-19 condition, unspecified: Secondary | ICD-10-CM | POA: Diagnosis not present

## 2020-09-30 ENCOUNTER — Other Ambulatory Visit: Payer: Self-pay | Admitting: Family Medicine

## 2020-09-30 ENCOUNTER — Other Ambulatory Visit (HOSPITAL_COMMUNITY): Payer: Self-pay

## 2020-09-30 DIAGNOSIS — I10 Essential (primary) hypertension: Secondary | ICD-10-CM

## 2020-09-30 MED ORDER — TRIAMTERENE-HCTZ 37.5-25 MG PO TABS
1.0000 | ORAL_TABLET | Freq: Every day | ORAL | 1 refills | Status: AC
  Filled 2020-09-30: qty 90, 90d supply, fill #0

## 2020-09-30 MED ORDER — VALSARTAN 160 MG PO TABS
160.0000 mg | ORAL_TABLET | Freq: Every day | ORAL | 1 refills | Status: AC
  Filled 2020-09-30: qty 90, 90d supply, fill #0

## 2020-09-30 MED ORDER — AMLODIPINE BESYLATE 2.5 MG PO TABS
2.5000 mg | ORAL_TABLET | Freq: Every day | ORAL | 1 refills | Status: AC
  Filled 2020-09-30: qty 90, 90d supply, fill #0

## 2020-09-30 NOTE — Telephone Encounter (Signed)
Left a detailed message at the pts cell number with the information below, stating refills were sent to the pharmacy and requested she call back for a lab appt if preferred.

## 2020-09-30 NOTE — Addendum Note (Signed)
Addended by: Agnes Lawrence on: 09/30/2020 10:39 AM   Modules accepted: Orders

## 2020-09-30 NOTE — Telephone Encounter (Signed)
Ok to send 6 months supply of her medications. I'm not sure from message when she is leaving; she has CPE scheduled in June with me. If she would like to get bloodwork before moving I can order that to complete. I'm not sure about moving physical as I know our spots are limited but if she needs/wants earlier appointment I can try to find her a spot.

## 2020-10-01 ENCOUNTER — Other Ambulatory Visit (HOSPITAL_COMMUNITY): Payer: Self-pay

## 2020-10-01 MED ORDER — PREGABALIN 50 MG PO CAPS
50.0000 mg | ORAL_CAPSULE | Freq: Three times a day (TID) | ORAL | 1 refills | Status: AC
  Filled 2020-10-01 – 2020-10-22 (×5): qty 270, 90d supply, fill #0

## 2020-10-01 MED ORDER — CYCLOBENZAPRINE HCL 10 MG PO TABS
10.0000 mg | ORAL_TABLET | Freq: Three times a day (TID) | ORAL | 1 refills | Status: AC | PRN
  Filled 2020-10-01 – 2020-10-04 (×2): qty 90, 30d supply, fill #0

## 2020-10-01 MED ORDER — POTASSIUM CHLORIDE CRYS ER 20 MEQ PO TBCR
20.0000 meq | EXTENDED_RELEASE_TABLET | Freq: Three times a day (TID) | ORAL | 1 refills | Status: AC
  Filled 2020-10-01 – 2020-10-04 (×2): qty 270, 90d supply, fill #0

## 2020-10-01 MED ORDER — IBUPROFEN 800 MG PO TABS
800.0000 mg | ORAL_TABLET | Freq: Three times a day (TID) | ORAL | 0 refills | Status: AC | PRN
  Filled 2020-10-01: qty 270, 90d supply, fill #0

## 2020-10-01 MED ORDER — EPINEPHRINE 0.3 MG/0.3ML IJ SOAJ
0.3000 mg | INTRAMUSCULAR | 2 refills | Status: AC | PRN
  Filled 2020-10-01: qty 2, 30d supply, fill #0

## 2020-10-04 ENCOUNTER — Other Ambulatory Visit (HOSPITAL_COMMUNITY): Payer: Self-pay

## 2020-10-09 ENCOUNTER — Other Ambulatory Visit (HOSPITAL_COMMUNITY): Payer: Self-pay

## 2020-10-11 ENCOUNTER — Other Ambulatory Visit (HOSPITAL_COMMUNITY): Payer: Self-pay

## 2020-10-14 ENCOUNTER — Other Ambulatory Visit (HOSPITAL_COMMUNITY): Payer: Self-pay

## 2020-10-22 ENCOUNTER — Other Ambulatory Visit (HOSPITAL_COMMUNITY): Payer: Self-pay

## 2020-10-23 ENCOUNTER — Other Ambulatory Visit (HOSPITAL_COMMUNITY): Payer: Self-pay

## 2020-10-30 ENCOUNTER — Encounter: Payer: 59 | Admitting: Family Medicine

## 2020-11-03 DIAGNOSIS — J029 Acute pharyngitis, unspecified: Secondary | ICD-10-CM | POA: Diagnosis not present

## 2020-11-03 DIAGNOSIS — Z20822 Contact with and (suspected) exposure to covid-19: Secondary | ICD-10-CM | POA: Diagnosis not present

## 2020-11-03 DIAGNOSIS — J019 Acute sinusitis, unspecified: Secondary | ICD-10-CM | POA: Diagnosis not present

## 2020-12-31 IMAGING — US US RENAL
1 series · 14 of 25 positions shown · non-contrast
Comparison: None.

CLINICAL DATA: Right flank pain.  History of renal calculi.

EXAM:
RENAL / URINARY TRACT ULTRASOUND COMPLETE

[Series 1: us renal · 0.22mm/px · 14 of 31 slices shown]
[im 1/31]
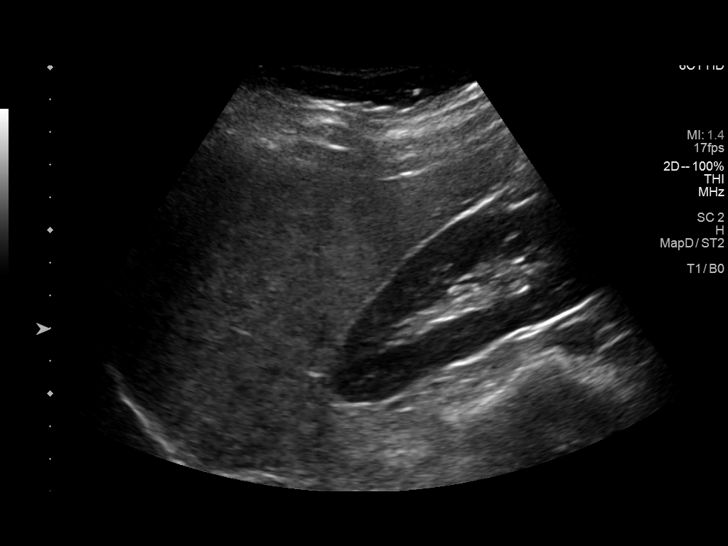
[im 3/31]
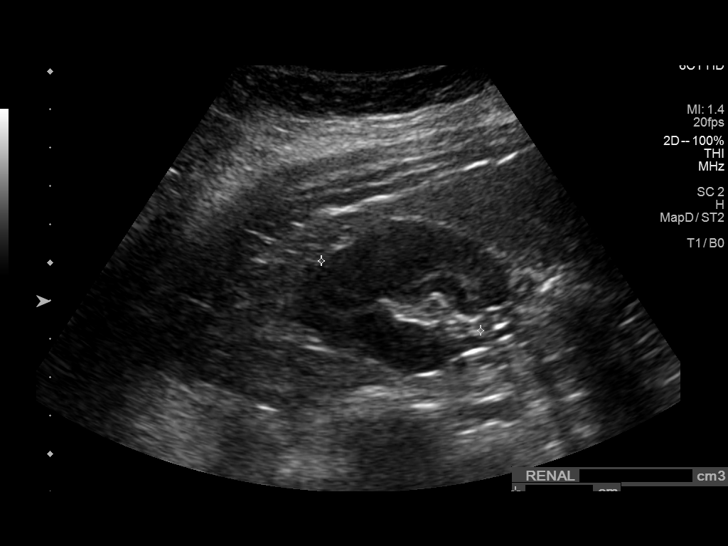
[im 6/31]
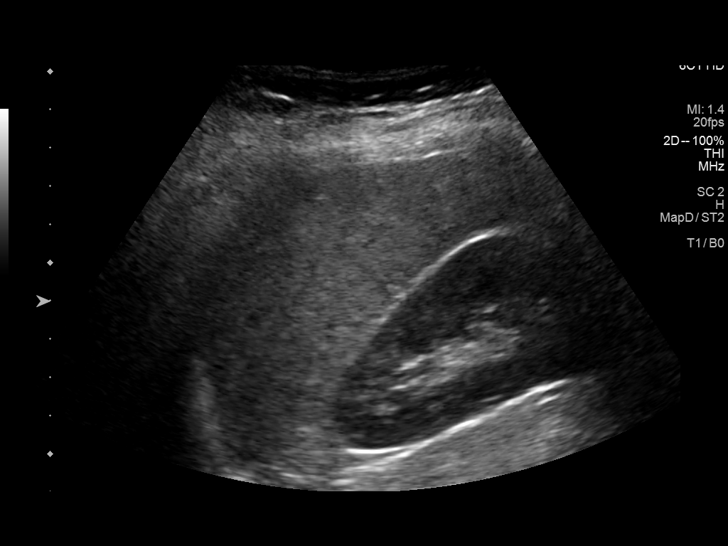
[im 8/31]
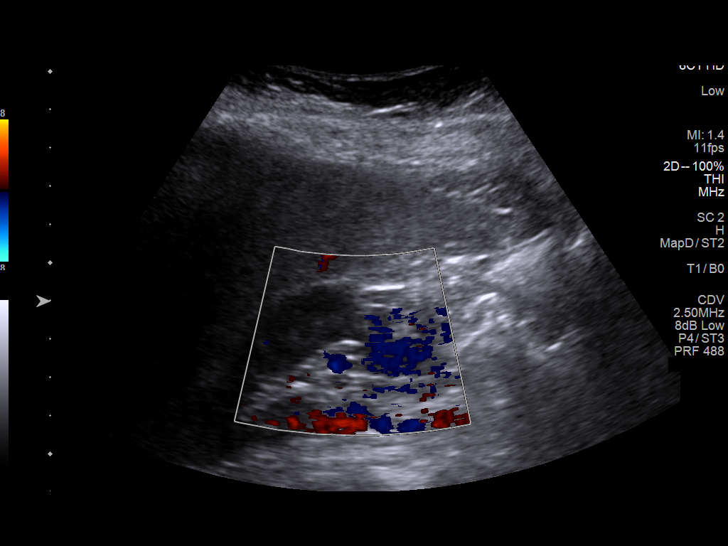
[im 11/31]
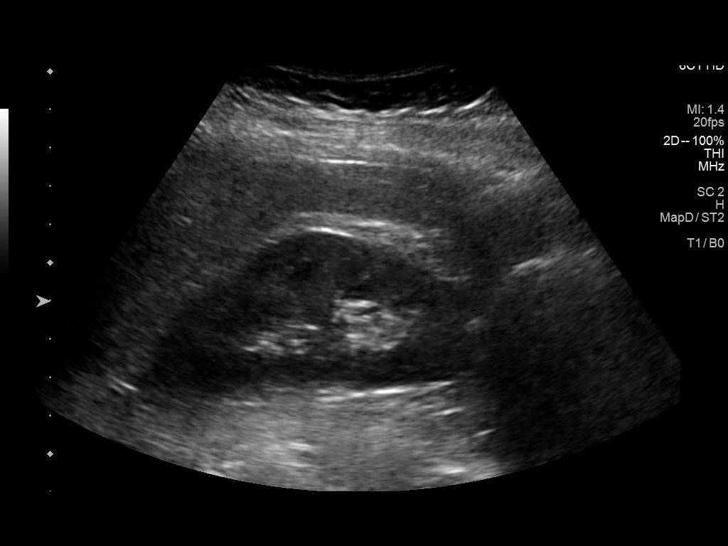
[im 12/31]
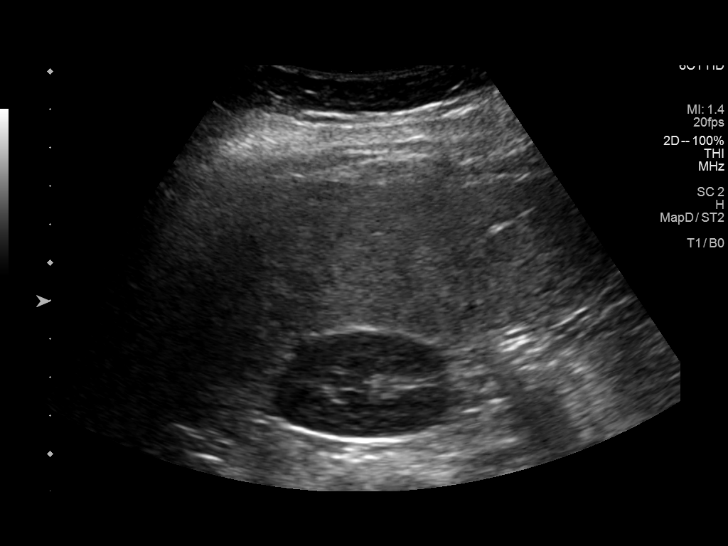
[im 14/31]
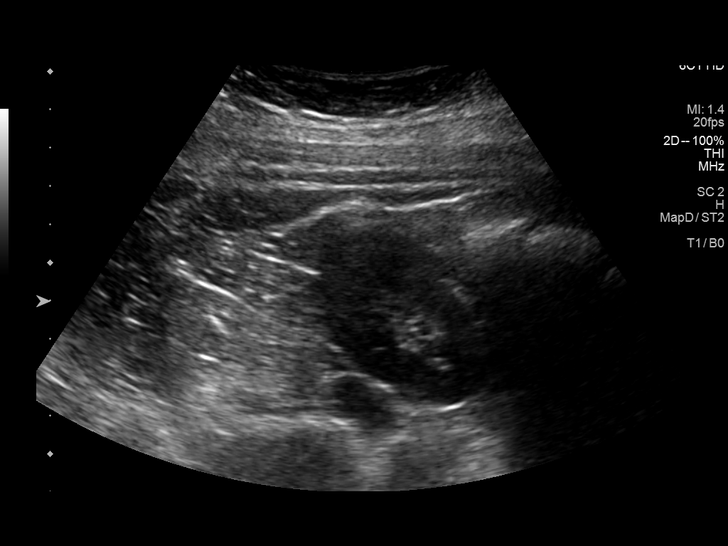
[im 17/31]
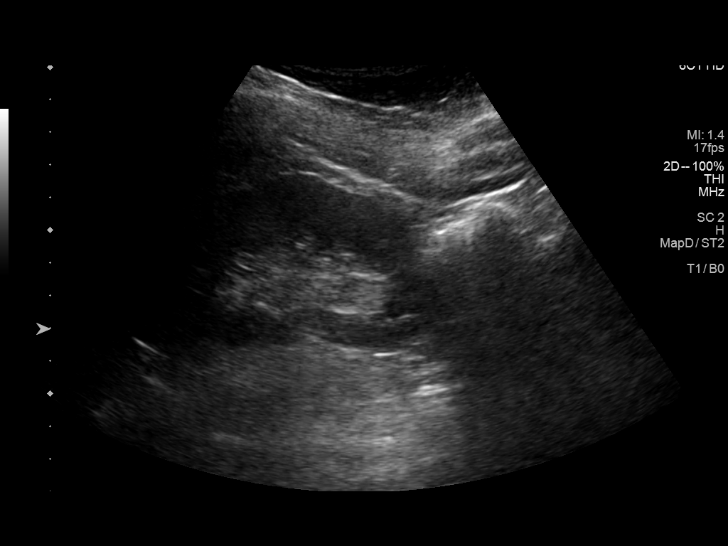
[im 19/31]
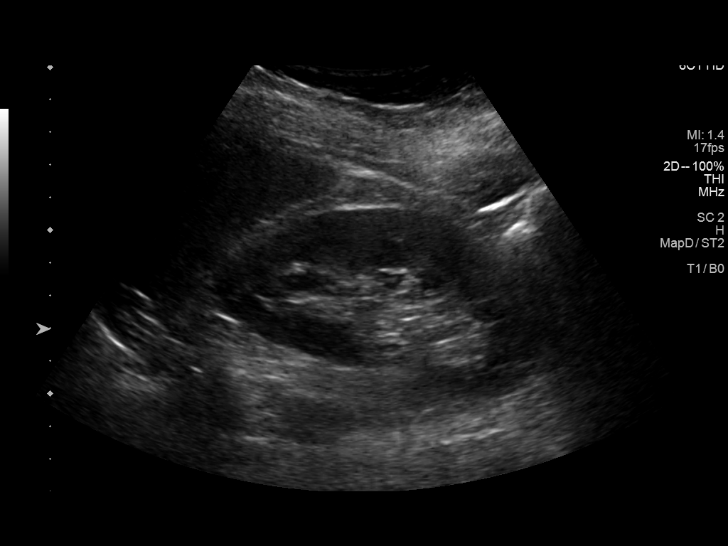
[im 21/31]
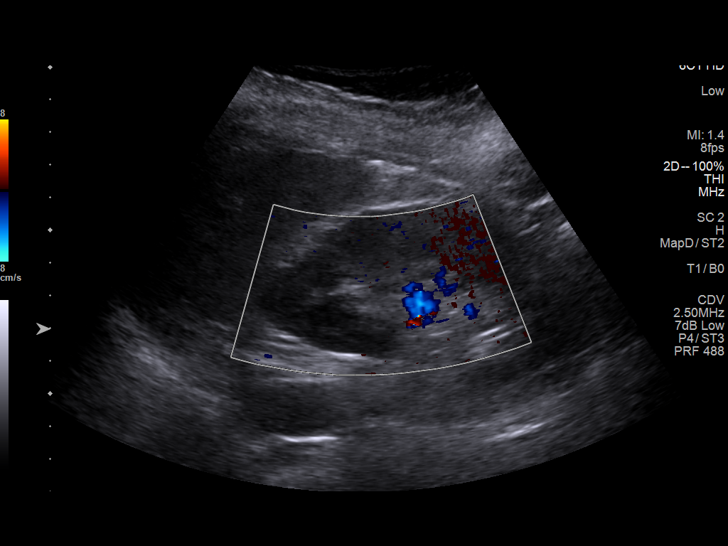
[im 23/31]
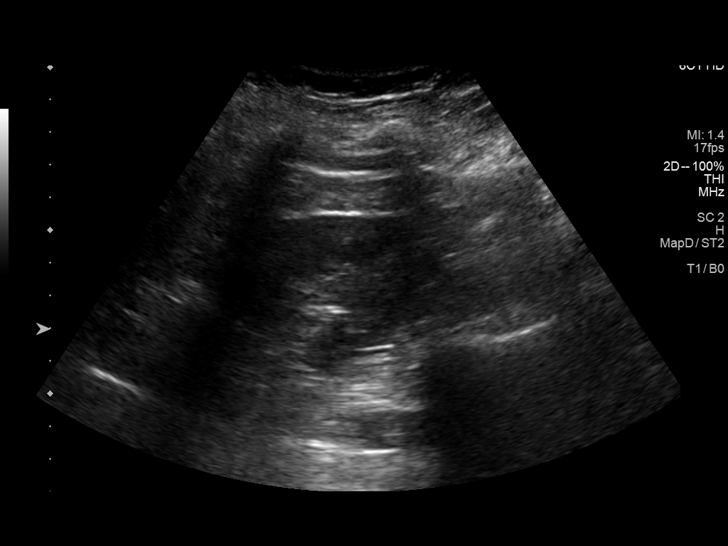
[im 26/31]
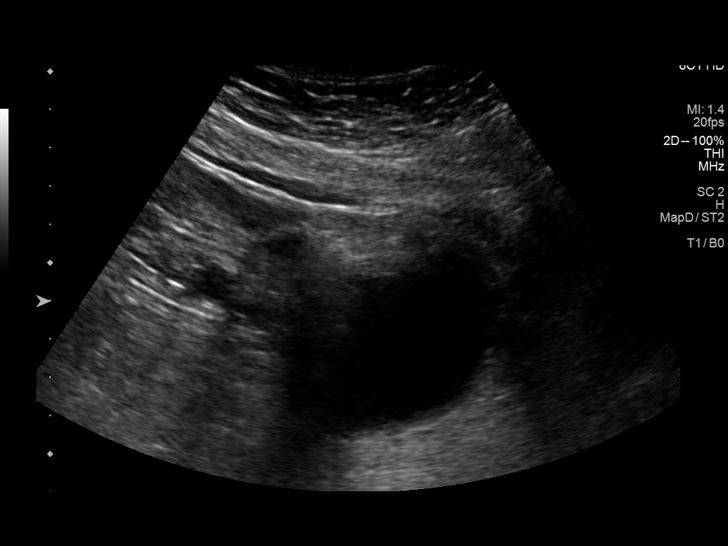
[im 28/31]
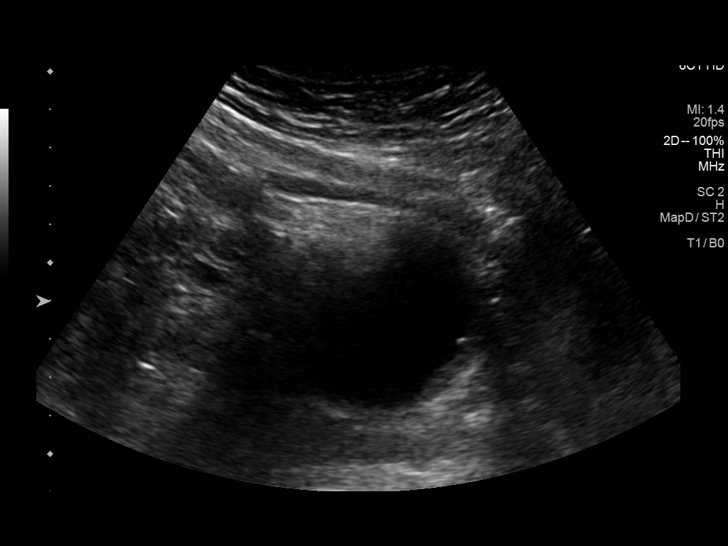
[im 31/31]
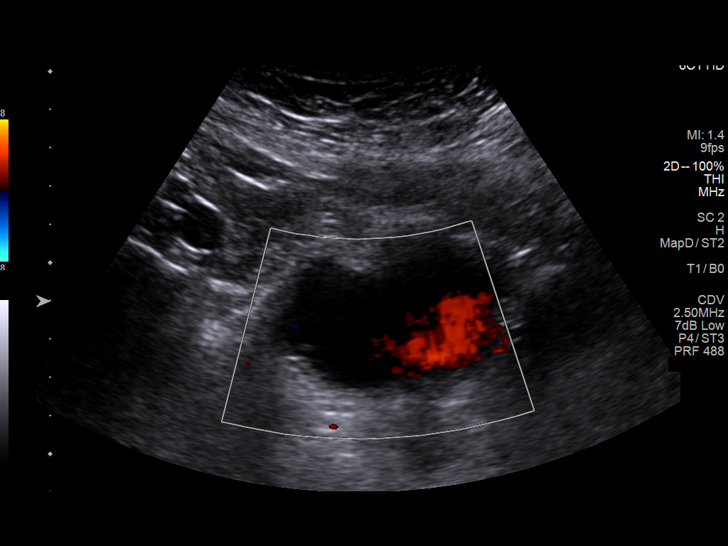

[14 of 25 positions shown; findings below may reference images not displayed]

FINDINGS: Right Kidney:

Renal measurements: 10.2 x 4.5 x 4.5 cm = volume: 108 mL .
Echogenicity within normal limits. No mass or hydronephrosis
visualized.

Left Kidney:

Renal measurements: 10.8 x 5.5 x 4.8 cm = volume: 149 mL.
Echogenicity within normal limits. No mass or hydronephrosis
visualized.

Bladder:

Normal bladder. Left ureteral jet visualized. Right jet not
visualized.

Other:

None.
IMPRESSION: No renal calculi no renal obstruction.

## 2021-09-22 ENCOUNTER — Encounter (RURAL_HEALTH_CENTER): Payer: Self-pay | Admitting: Family Medicine

## 2021-09-22 NOTE — Progress Notes (Signed)
Chronic Disease Management-AMB  INTERVAL HISTORY    56 year old female is here today for routine follow-up  She is established with a counselor since her last visit with me for her grief/bereavement.  She is not yet ready to start an antidepressant though we did discuss today that if we were to choose an antidepressant Cymbalta would be a good choice because as below she is having some pain in her legs.  Pain in her legs has been ongoing now but worsening with her new job, she used to be very active and has become more sedentary with her new position.  She feels like this is creating increased swelling and some pain in the evenings.  She is doing some massage therapy and uses ibuprofen Flexeril and Lyrica already to help with her chronic pain from back and knee surgeries.    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:  Fioricet as needed for mild to moderate migraine symptoms  Zofran as needed for nausea  Naratriptan 2.5 mg as needed for severe migraine symptoms    OTC medications:  Of vitamin D3 1000 units daily  Magnesium 500 mg daily  Cranberry 8400 mg daily  Probiotic daily  B12 1000 mcg every 3 days    Grief, bereavement:  Death of both parents at end of 2022  est with counselor    Insomnia:  Trazodone    PREVENTATIVE  Never had mammogram, family history in paternal aunt  COVID-19 vaccine: Pfizer x3  Reports Tdap flu hepatitis A and hepatitis B up-to-date                            Assessment & Plan  (1) Migraines:        Status: Acute        Code(s):  G43.909 - Migraine, unspecified, not intractable, without  status migrainosus        Qualifiers:          Migraine type: unspecified  Status migrainosus presence: without status migrainosus  Intractability: not intractable  Qualified Code(s): G43.909 - Migraine, unspecified, not intractable, without status migrainosus  (2) Vitamin D deficiency:        Status: Acute        Code(s):  E55.9 - Vitamin D deficiency, unspecified  (3) Vitamin B12 deficiency:        Status: Acute        Code(s):  E53.8 - Deficiency of other specified B group vitamins  (4) Hypokalemia:        Status: Acute        Code(s):  E87.6 - Hypokalemia  (5) Chronic radicular low back pain:        Status: Acute        Code(s):  M54.16 - Radiculopathy, lumbar region; G89.29 - Other chronic pain  (6) GERD without esophagitis:        Status: Acute        Code(s):  K21.9 - Gastro-esophageal reflux disease without  esophagitis  (7) Essential hypertension:        Status: Acute        Code(s):  I10 - Essential (primary) hypertension  (8) Grief reaction with prolonged bereavement:        Status: Acute        Code(s):  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  She wants to continue with counseling for the time being but if she would want to start antidepressant she can call office and let us know because we already discussed risk versus benefits of Cymbalta.  We would start Cymbalta 20 mg daily and uptitrate if necessary.  To be a good choice for her to also help combat her chronic pain issues.  I will follow-up with her in 3 months but sooner if needed        Orders:  Orders  Basic Metabolic Panel 3 Months E53.8 - Deficiency of other specified B group vitamins, E55.9 - Vitamin D deficiency, unspecified, E87.6 - Hypokalemia, F43.81 - Prolonged grief disorder, G43.909 - Migraine, unspecified, not intractable, without status migrainosus, G89.29 - Other chronic pain, I10 -  Essential (primary) hypertension, K21.9 - Gastro-esophageal reflux disease without esophagitis, M54.16 - Radiculopathy, lumbar region    Lipid Panel 3 Months E53.8 - Deficiency of other specified B group vitamins, E55.9 - Vitamin D deficiency, unspecified, E78.5 - Hyperlipidemia, unspecified, E87.6 - Hypokalemia, F43.81 - Prolonged grief disorder, G43.909 - Migraine, unspecified, not intractable, without status migrainosus, G89.29 - Other chronic pain, I10 - Essential (primary) hypertension, K21.9 - Gastro-esophageal reflux disease without esophagitis, M54.16 - Radiculopathy, lumbar region    Liver Panel 3 Months E53.8 - Deficiency of other specified B group vitamins, E55.9 - Vitamin D deficiency, unspecified, E87.6 - Hypokalemia, F43.81 - Prolonged grief disorder, G43.909 - Migraine, unspecified, not intractable, without status migrainosus, G89.29 - Other chronic pain, I10 - Essential (primary) hypertension, K21.9 - Gastro-esophageal reflux disease without esophagitis, M54.16 - Radiculopathy, lumbar region, Z51.81 - Encounter for therapeutic drug level monitoring    Vitamin B12 3 Months E53.8 - Deficiency of other specified B group vitamins, E55.9 - Vitamin D deficiency, unspecified, E87.6 - Hypokalemia, F43.81 - Prolonged grief disorder, G43.909 - Migraine, unspecified, not intractable, without status migrainosus, G89.29 - Other chronic pain, I10 - Essential (primary) hypertension, K21.9 - Gastro-esophageal reflux disease without esophagitis, M54.16 - Radiculopathy, lumbar region    Thyroid Stimulating Hormone 3 Months E07.9 - Disorder of thyroid, unspecified, E53.8 - Deficiency of other specified B group vitamins, E55.9 - Vitamin D deficiency, unspecified, E87.6 - Hypokalemia, F43.81 - Prolonged grief disorder, G43.909 - Migraine, unspecified, not intractable, without status migrainosus, G89.29 - Other chronic pain, I10 - Essential (primary) hypertension, K21.9 - Gastro-esophageal reflux disease without  esophagitis, M54.16 - Radiculopathy, lumbar region    VIT D25(OH) TOT 3 Months E53.8 - Deficiency of other specified B group vitamins, E55.9 - Vitamin D deficiency, unspecified, E87.6 - Hypokalemia, F43.81 - Prolonged grief disorder, G43.909 - Migraine, unspecified, not intractable, without status migrainosus, G89.29 - Other chronic pain, I10 - Essential (primary) hypertension, K21.9 - Gastro-esophageal reflux disease without esophagitis, M54.16 - Radiculopathy, lumbar region    HEMOGLOBIN A1c 3 Months E53.8 - Deficiency of other specified B group vitamins, E55.9 - Vitamin D deficiency, unspecified, E87.6 - Hypokalemia, F43.81 - Prolonged grief disorder, G43.909 - Migraine, unspecified, not intractable, without status migrainosus,  G89.29 - Other chronic pain, I10 - Essential (primary) hypertension, K21.9 - Gastro-esophageal reflux disease without esophagitis, M54.16 - Radiculopathy, lumbar region, R73.9 - Hyperglycemia, unspecified    Complete Blood Count Auto Diff 3 Months E53.8 - Deficiency of other specified B group vitamins, E55.9 - Vitamin D deficiency, unspecified, E87.6 - Hypokalemia, F43.81 - Prolonged grief disorder, G43.909 - Migraine, unspecified, not intractable, without status migrainosus, G89.29 - Other chronic pain, I10 - Essential (primary) hypertension, K21.9 - Gastro-esophageal reflux disease without esophagitis, M54.16 - Radiculopathy, lumbar region      Plan Details  Instructions:      Vitamin B-12      Vitamin D

## 2021-10-01 ENCOUNTER — Ambulatory Visit: Payer: 59 | Attending: Family Medicine | Admitting: Family Medicine

## 2021-10-01 ENCOUNTER — Other Ambulatory Visit: Payer: Self-pay

## 2021-10-01 ENCOUNTER — Ambulatory Visit (RURAL_HEALTH_CENTER): Payer: 59 | Attending: Family Medicine

## 2021-10-01 DIAGNOSIS — I10 Essential (primary) hypertension: Secondary | ICD-10-CM | POA: Insufficient documentation

## 2021-10-01 DIAGNOSIS — G43909 Migraine, unspecified, not intractable, without status migrainosus: Secondary | ICD-10-CM | POA: Insufficient documentation

## 2021-10-01 DIAGNOSIS — M5416 Radiculopathy, lumbar region: Secondary | ICD-10-CM | POA: Insufficient documentation

## 2021-10-01 DIAGNOSIS — K219 Gastro-esophageal reflux disease without esophagitis: Secondary | ICD-10-CM | POA: Insufficient documentation

## 2021-10-01 DIAGNOSIS — Z5181 Encounter for therapeutic drug level monitoring: Secondary | ICD-10-CM | POA: Insufficient documentation

## 2021-10-01 DIAGNOSIS — R739 Hyperglycemia, unspecified: Secondary | ICD-10-CM | POA: Insufficient documentation

## 2021-10-01 DIAGNOSIS — E876 Hypokalemia: Secondary | ICD-10-CM | POA: Insufficient documentation

## 2021-10-01 DIAGNOSIS — E538 Deficiency of other specified B group vitamins: Secondary | ICD-10-CM | POA: Insufficient documentation

## 2021-10-01 DIAGNOSIS — G8929 Other chronic pain: Secondary | ICD-10-CM | POA: Insufficient documentation

## 2021-10-01 DIAGNOSIS — E559 Vitamin D deficiency, unspecified: Secondary | ICD-10-CM | POA: Insufficient documentation

## 2021-10-01 LAB — HEPATIC FUNCTION PANEL
ALBUMIN/GLOBULIN RATIO: 1.5 — ABNORMAL HIGH (ref 0.8–1.4)
ALBUMIN: 4.1 g/dL (ref 3.5–5.7)
ALKALINE PHOSPHATASE: 81 U/L (ref 34–104)
ALT (SGPT): 13 U/L (ref 7–52)
AST (SGOT): 16 U/L (ref 13–39)
BILIRUBIN DIRECT: 0.08 md/dL (ref <=0.20)
BILIRUBIN TOTAL: 0.5 mg/dL (ref 0.3–1.2)
BILIRUBIN, INDIRECT: 0.42 mg/dL (ref <=1)
GLOBULIN: 2.7 — ABNORMAL LOW (ref 2.9–5.4)
PROTEIN TOTAL: 6.8 g/dL (ref 6.4–8.9)

## 2021-10-01 LAB — LIPID PANEL
CHOL/HDL RATIO: 3.5
CHOLESTEROL: 187 mg/dL (ref <200)
HDL CHOL: 53 mg/dL (ref 23–92)
LDL CALC: 102 mg/dL — ABNORMAL HIGH (ref 0–100)
TRIGLYCERIDES: 159 mg/dL — ABNORMAL HIGH (ref <=150)
VLDL CALC: 32 mg/dL (ref 0–50)

## 2021-10-01 LAB — CBC WITH DIFF
BASOPHIL #: 0.1 10*3/uL (ref 0.00–0.30)
BASOPHIL %: 1 % (ref 0–3)
EOSINOPHIL #: 0.2 10*3/uL (ref 0.00–0.80)
EOSINOPHIL %: 2 % (ref 0–7)
HCT: 35.6 % — ABNORMAL LOW (ref 37.0–47.0)
HGB: 12.3 g/dL — ABNORMAL LOW (ref 12.5–16.0)
LYMPHOCYTE #: 6.2 10*3/uL — ABNORMAL HIGH (ref 1.10–5.00)
LYMPHOCYTE %: 59 % — ABNORMAL HIGH (ref 25–45)
MCH: 31.7 pg (ref 27.0–32.0)
MCHC: 34.5 g/dL (ref 32.0–36.0)
MCV: 91.8 fL (ref 78.0–99.0)
MONOCYTE #: 0.5 10*3/uL (ref 0.00–1.30)
MONOCYTE %: 5 % (ref 0–12)
MPV: 8 fL (ref 7.4–10.4)
NEUTROPHIL #: 3.7 10*3/uL (ref 1.80–8.40)
NEUTROPHIL %: 35 % — ABNORMAL LOW (ref 40–76)
PLATELET COMMENT: NORMAL
PLATELETS: 375 10*3/uL (ref 140–440)
RBC COMMENT: NORMAL
RBC: 3.88 10*6/uL — ABNORMAL LOW (ref 4.20–5.40)
RDW: 12.8 % (ref 11.6–14.8)
WBC: 10.6 10*3/uL — ABNORMAL HIGH (ref 4.0–10.5)
WBCS UNCORRECTED: 10.6 10*3/uL

## 2021-10-01 LAB — BASIC METABOLIC PANEL
ANION GAP: 6 mmol/L — ABNORMAL LOW (ref 10–20)
BUN/CREA RATIO: 18 (ref 6–22)
BUN: 15 mg/dL (ref 7–25)
CALCIUM: 9.2 mg/dL (ref 8.6–10.3)
CHLORIDE: 99 mmol/L (ref 98–107)
CO2 TOTAL: 30 mmol/L (ref 21–31)
CREATININE: 0.82 mg/dL (ref 0.60–1.30)
ESTIMATED GFR: 83 mL/min/{1.73_m2} (ref >59)
GLUCOSE: 75 mg/dL (ref 74–109)
OSMOLALITY, CALCULATED: 270 mOsm/kg (ref 270–290)
POTASSIUM: 3.4 mmol/L — ABNORMAL LOW (ref 3.5–5.1)
SODIUM: 135 mmol/L — ABNORMAL LOW (ref 136–145)

## 2021-10-01 LAB — VITAMIN B12: VITAMIN B 12: 983 pg/mL — ABNORMAL HIGH (ref 180–914)

## 2021-10-01 LAB — VITAMIN D 25 TOTAL: VITAMIN D: 60 ng/mL (ref 30–100)

## 2021-10-01 LAB — THYROID STIMULATING HORMONE (SENSITIVE TSH): TSH: 3.148 u[IU]/mL (ref 0.450–5.330)

## 2021-10-02 LAB — HGA1C (HEMOGLOBIN A1C WITH EST AVG GLUCOSE): HEMOGLOBIN A1C: 5.3 % (ref 4.0–6.0)

## 2021-10-08 ENCOUNTER — Other Ambulatory Visit: Payer: Self-pay

## 2021-10-08 ENCOUNTER — Encounter (RURAL_HEALTH_CENTER): Payer: Self-pay | Admitting: Family Medicine

## 2021-10-08 ENCOUNTER — Ambulatory Visit (RURAL_HEALTH_CENTER): Payer: 59 | Attending: Family Medicine | Admitting: Family Medicine

## 2021-10-08 VITALS — BP 106/67 | HR 88 | Temp 98.2°F | Resp 20 | Wt 172.0 lb

## 2021-10-08 DIAGNOSIS — F4320 Adjustment disorder, unspecified: Secondary | ICD-10-CM | POA: Insufficient documentation

## 2021-10-08 DIAGNOSIS — E876 Hypokalemia: Secondary | ICD-10-CM | POA: Insufficient documentation

## 2021-10-08 DIAGNOSIS — I10 Essential (primary) hypertension: Secondary | ICD-10-CM | POA: Insufficient documentation

## 2021-10-08 DIAGNOSIS — Z91030 Bee allergy status: Secondary | ICD-10-CM | POA: Insufficient documentation

## 2021-10-08 DIAGNOSIS — M5410 Radiculopathy, site unspecified: Secondary | ICD-10-CM

## 2021-10-08 DIAGNOSIS — T7840XA Allergy, unspecified, initial encounter: Secondary | ICD-10-CM

## 2021-10-08 DIAGNOSIS — M5416 Radiculopathy, lumbar region: Secondary | ICD-10-CM | POA: Insufficient documentation

## 2021-10-08 DIAGNOSIS — Z634 Disappearance and death of family member: Secondary | ICD-10-CM | POA: Insufficient documentation

## 2021-10-08 DIAGNOSIS — G43909 Migraine, unspecified, not intractable, without status migrainosus: Secondary | ICD-10-CM

## 2021-10-08 DIAGNOSIS — F4329 Adjustment disorder with other symptoms: Secondary | ICD-10-CM | POA: Insufficient documentation

## 2021-10-08 DIAGNOSIS — E538 Deficiency of other specified B group vitamins: Secondary | ICD-10-CM | POA: Insufficient documentation

## 2021-10-08 DIAGNOSIS — K219 Gastro-esophageal reflux disease without esophagitis: Secondary | ICD-10-CM | POA: Insufficient documentation

## 2021-10-08 DIAGNOSIS — Z79899 Other long term (current) drug therapy: Secondary | ICD-10-CM | POA: Insufficient documentation

## 2021-10-08 DIAGNOSIS — G4700 Insomnia, unspecified: Secondary | ICD-10-CM | POA: Insufficient documentation

## 2021-10-08 DIAGNOSIS — E559 Vitamin D deficiency, unspecified: Secondary | ICD-10-CM | POA: Insufficient documentation

## 2021-10-08 MED ORDER — ONDANSETRON HCL 4 MG TABLET
4.0000 mg | ORAL_TABLET | Freq: Three times a day (TID) | ORAL | 1 refills | Status: DC | PRN
Start: 2021-10-08 — End: 2022-04-21

## 2021-10-08 MED ORDER — TRAZODONE 50 MG TABLET
50.0000 mg | ORAL_TABLET | Freq: Every evening | ORAL | 1 refills | Status: DC
Start: 2021-10-08 — End: 2022-03-19

## 2021-10-08 MED ORDER — VALSARTAN 160 MG TABLET
160.0000 mg | ORAL_TABLET | Freq: Every day | ORAL | 1 refills | Status: DC
Start: 2021-10-08 — End: 2022-03-19

## 2021-10-08 MED ORDER — PREGABALIN 50 MG CAPSULE
50.0000 mg | ORAL_CAPSULE | Freq: Three times a day (TID) | ORAL | 2 refills | Status: DC
Start: 2021-10-08 — End: 2022-01-27

## 2021-10-08 MED ORDER — RABEPRAZOLE 20 MG TABLET,DELAYED RELEASE
20.0000 mg | DELAYED_RELEASE_TABLET | Freq: Every day | ORAL | 1 refills | Status: DC
Start: 2021-10-08 — End: 2022-03-19

## 2021-10-08 MED ORDER — NARATRIPTAN 2.5 MG TABLET
2.5000 mg | ORAL_TABLET | Freq: Every day | ORAL | 0 refills | Status: DC | PRN
Start: 2021-10-08 — End: 2022-06-05

## 2021-10-08 MED ORDER — FLUTICASONE PROPIONATE 50 MCG/ACTUATION NASAL SPRAY,SUSPENSION
1.0000 | Freq: Two times a day (BID) | NASAL | 1 refills | Status: DC
Start: 2021-10-08 — End: 2021-11-05

## 2021-10-08 MED ORDER — IBUPROFEN 800 MG TABLET
800.0000 mg | ORAL_TABLET | Freq: Three times a day (TID) | ORAL | 1 refills | Status: DC | PRN
Start: 2021-10-08 — End: 2021-11-12

## 2021-10-08 MED ORDER — TRIAMTERENE 37.5 MG-HYDROCHLOROTHIAZIDE 25 MG TABLET
1.0000 | ORAL_TABLET | Freq: Every morning | ORAL | 1 refills | Status: DC
Start: 2021-10-08 — End: 2022-03-19

## 2021-10-08 MED ORDER — POTASSIUM CHLORIDE ER 20 MEQ TABLET,EXTENDED RELEASE(PART/CRYST)
20.0000 meq | ORAL_TABLET | Freq: Three times a day (TID) | ORAL | 1 refills | Status: DC
Start: 2021-10-08 — End: 2022-01-30

## 2021-10-08 MED ORDER — CYCLOBENZAPRINE 10 MG TABLET
10.0000 mg | ORAL_TABLET | Freq: Every evening | ORAL | 1 refills | Status: DC | PRN
Start: 2021-10-08 — End: 2022-03-19

## 2021-10-08 MED ORDER — AMLODIPINE 2.5 MG TABLET
2.5000 mg | ORAL_TABLET | Freq: Every day | ORAL | 1 refills | Status: DC
Start: 2021-10-08 — End: 2022-03-19

## 2021-10-08 MED ORDER — EPINEPHRINE 0.3 MG/0.3 ML INJECTION, AUTO-INJECTOR
0.3000 mg | AUTO-INJECTOR | Freq: Once | INTRAMUSCULAR | 0 refills | Status: DC | PRN
Start: 2021-10-08 — End: 2022-11-25

## 2021-10-08 MED ORDER — BUTALBITAL-ACETAMINOPHEN-CAFFEINE 50 MG-300 MG-40 MG CAPSULE
1.0000 | ORAL_CAPSULE | Freq: Three times a day (TID) | ORAL | 1 refills | Status: DC | PRN
Start: 2021-10-08 — End: 2022-03-19

## 2021-10-08 NOTE — Progress Notes (Signed)
[facility]  FAMILY MEDICINE, [facility]  Operated by [facility]  Progress Note    Name: Cassandra Elliott MRN:  [id number]   Date: 10/08/2021 Age: 56 y.o.       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:  Fioricet as needed for mild to moderate migraine symptoms  Zofran as needed for nausea  Naratriptan 2.5 mg as needed for severe migraine symptoms    OTC medications:  Of vitamin D3 1000 units daily  Magnesium 500 mg daily  Cranberry 8400 mg daily  Probiotic daily  B12 1000 mcg every 3 days    Grief, bereavement:  Death of both parents at end of 2022  est with counselor - doing well with counselor    Insomnia:  Trazodone    PREVENTATIVE  Never had mammogram, family history in paternal aunt  COVID-19 vaccine: Pfizer x3  Reports Tdap flu hepatitis A and hepatitis B up-to-date  Objective :  BP 106/67 (Site: Left, Patient Position: Sitting, Cuff Size: Adult)   Pulse 88   Temp 36.8 C (98.2 F) (Tympanic)   Resp 20   Wt 78 kg (172 lb)   SpO2 98%       Const  General: cooperative, alert, awake and Physically active  Orientation/Consciousness: oriented to person, oriented to place and oriented to time  Limitations: no limitations    HENMT  Head: normal to inspection, normocephalic and atraumatic  Ears: hearing grossly normal bilaterally  General nose exam: external nose normal    Eyes  General: appearance normal, both eyes and all related structures  Eyelids: eyelids  normal  Conjunctivae: conjunctivae normal  Sclera: sclerae normal    Resp  Effort & Inspection: normal respiratory effort and able to speak in complete sentences  Auscultation: clear to auscultation bilaterally, no rales, no rhonchi and no wheezes    Cardio  Rate: regular rate  Rhythm: regular rhythm  Heart Sounds: S1 normal and S2 normal    GI  Inspection: Yes normal to inspection  Palpation: soft  Auscultation: normal bowel sounds    Neuro  General: oriented to person, oriented to place and oriented to time  Cognition: normal cognition    Psych  Appearance: grossly normal  Mental Status: mental status grossly normal  Speech and Movement: speech and movement normal  Attitude: cooperative  Data reviewed:    Current Outpatient Medications   Medication Sig   . amLODIPine (NORVASC) 2.5 mg Oral Tablet Take 1 Tablet (2.5 mg total) by mouth Once a day   . aspirin 81 mg Oral Tablet, Chewable Chew 1 Tablet (81 mg total) Once a day   . Butalbital-Acetaminophen-Caff 50-300-40 mg Oral Capsule Take 1 Capsule by mouth Every 8 hours as needed   . cetirizine (ZYRTEC) 10 mg Oral Tablet Take 1 Tablet (10 mg total) by mouth Once a day   . Cholecalciferol, Vitamin D3, 25 mcg (  1,000 unit) Oral Capsule Take 5 Capsules (5,000 Units total) by mouth Once a day   . cranberry conc/ascorbic acid (SUPER CRANBERRY ORAL) Take 8,400 mg by mouth Once a day Uses OTC 8400   . cyanocobalamin (VITAMIN B 12) 1,000 mcg Oral Tablet Take 1 Tablet (1,000 mcg total) by mouth Every 3 days   . cyclobenzaprine (FLEXERIL) 10 mg Oral Tablet Take 1 Tablet (10 mg total) by mouth Every night as needed   . EPINEPHrine 0.3 mg/0.3 mL Injection Auto-Injector Inject 0.3 mL (0.3 mg total) into the muscle Once, as needed for up to 1 dose   . fluticasone propionate (FLONASE) 50 mcg/actuation Nasal Spray, Suspension Administer 1 Spray into each nostril Twice daily   . Ibuprofen (MOTRIN) 800 mg Oral Tablet Take 1 Tablet (800 mg total) by mouth Every 8 hours as needed   .  Magnesium Gluconate 27 mg magnesium (500 mg) Oral Tablet Take 500 mg by mouth Once a day   . Naratriptan 2.5 mg Oral Tablet Take 1 Tablet (2.5 mg total) by mouth Once per day as needed for Migraine for up to 1 dose   . ondansetron (ZOFRAN) 4 mg Oral Tablet Take 1 Tablet (4 mg total) by mouth Every 8 hours as needed   . potassium chloride (K-DUR) 20 mEq Oral Tab Sust.Rel. Particle/Crystal Take 1 Tablet (20 mEq total) by mouth Three times a day   . pregabalin (LYRICA) 50 mg Oral Capsule Take 1 Capsule (50 mg total) by mouth Three times a day   . RABEprazole (ACIPHEX) 20 mg Oral Tablet, Delayed Release (E.C.) Take 1 Tablet (20 mg total) by mouth Once a day   . Saccharomyces boulardii (FLORASTOR) 250 mg Oral Capsule Take 1 Capsule (250 mg total) by mouth Once a day   . traZODone (DESYREL) 50 mg Oral Tablet Take 1 Tablet (50 mg total) by mouth Every night   . triamterene-hydroCHLOROthiazide (MAXZIDE-25) 37.5-25 mg Oral Tablet Take 1 Tablet by mouth Every morning   . valsartan (DIOVAN) 160 mg Oral Tablet Take 1 Tablet (160 mg total) by mouth Once a day     Assessment/Plan  Problem List Items Addressed This Visit        Cardiovascular System    Hypertension    Relevant Medications    amLODIPine (NORVASC) 2.5 mg Oral Tablet    triamterene-hydroCHLOROthiazide (MAXZIDE-25) 37.5-25 mg Oral Tablet    valsartan (DIOVAN) 160 mg Oral Tablet       Neurologic    Migraines    Relevant Medications    Butalbital-Acetaminophen-Caff 50-300-40 mg Oral Capsule    Naratriptan 2.5 mg Oral Tablet    ondansetron (ZOFRAN) 4 mg Oral Tablet       Digestive    Esophageal reflux    Relevant Medications    RABEprazole (ACIPHEX) 20 mg Oral Tablet, Delayed Release (E.C.)       Endocrine    Vitamin D deficiency    B12 deficiency       Psychiatric    Grief reaction with prolonged bereavement       Other    Hypokalemia    Relevant Medications    potassium chloride (K-DUR) 20 mEq Oral Tab Sust.Rel. Particle/Crystal   Other Visit Diagnoses     Back pain  with radiculopathy    -  Primary    Relevant Medications    cyclobenzaprine (FLEXERIL) 10 mg Oral Tablet    Ibuprofen (MOTRIN) 800 mg Oral Tablet    pregabalin (LYRICA) 50 mg Oral Capsule  Allergies        Relevant Medications    EPINEPHrine 0.3 mg/0.3 mL Injection Auto-Injector    fluticasone propionate (FLONASE) 50 mcg/actuation Nasal Spray, Suspension    Insomnia, unspecified type        Relevant Medications    traZODone (DESYREL) 50 mg Oral Tablet        Doing well, refills today as above.  Follow up 3 months, sooner if needed  Reviewed labs with her today, potassium was a bit low - increase fruit and vegetables in diet. Continue supplement. BP is very good so I'm apprehensive about stopping her thiazide.      Larna Daughters, DO

## 2021-11-05 ENCOUNTER — Other Ambulatory Visit (RURAL_HEALTH_CENTER): Payer: Self-pay | Admitting: Family Medicine

## 2021-11-05 DIAGNOSIS — T7840XA Allergy, unspecified, initial encounter: Secondary | ICD-10-CM

## 2021-11-12 ENCOUNTER — Other Ambulatory Visit (RURAL_HEALTH_CENTER): Payer: Self-pay | Admitting: Family Medicine

## 2021-11-12 DIAGNOSIS — M5410 Radiculopathy, site unspecified: Secondary | ICD-10-CM

## 2021-12-01 ENCOUNTER — Other Ambulatory Visit (RURAL_HEALTH_CENTER): Payer: Self-pay | Admitting: Family Medicine

## 2021-12-19 ENCOUNTER — Other Ambulatory Visit (RURAL_HEALTH_CENTER): Payer: Self-pay | Admitting: Family Medicine

## 2021-12-22 NOTE — Telephone Encounter (Signed)
Refill sent 10/08/21

## 2022-01-12 ENCOUNTER — Ambulatory Visit (RURAL_HEALTH_CENTER): Payer: 59 | Attending: Family Medicine | Admitting: Family Medicine

## 2022-01-12 ENCOUNTER — Encounter (RURAL_HEALTH_CENTER): Payer: Self-pay | Admitting: Family Medicine

## 2022-01-12 ENCOUNTER — Other Ambulatory Visit: Payer: Self-pay

## 2022-01-12 VITALS — BP 132/76 | HR 77 | Temp 98.4°F | Resp 16

## 2022-01-12 DIAGNOSIS — Z0289 Encounter for other administrative examinations: Secondary | ICD-10-CM | POA: Insufficient documentation

## 2022-01-12 DIAGNOSIS — Z23 Encounter for immunization: Secondary | ICD-10-CM | POA: Insufficient documentation

## 2022-01-12 NOTE — Nursing Note (Signed)
Needs PPD and Tdap for work

## 2022-01-12 NOTE — Progress Notes (Signed)
FAMILY MEDICINE, [facility]  [address]  [place] [address]  Operated by [facility]    Acute Office Visit      NAME: Aniah Pauli DOS: 01/12/2022   MRN: [id number] DOB: [birth year]     Chief Complaint: Immunizations      History of Present Illness: Latayvia Mandujano is a 56 y.o. female who comes in today requesting PPD and TDaP. 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      Nursing Notes:   Arta Bruce, [place]  01/12/22 1409  Signed  Needs PPD and Tdap for work      No other acute concerns or complaints at this time.    Medical History     I have reviewed and updated as appropriate the past medical, surgical, family, and social history today:  Medical History/Surgical History/Family History/Social History  Past Medical History:   Diagnosis Date    B12 deficiency     Biceps tendinitis     Chronic radicular low back pain     Costochondritis     Esophageal reflux     Grief reaction with prolonged bereavement     Hypertension     Hypokalemia     Migraines     Rash     Somatic dysfunction of rib cage region     Vitamin D deficiency          No past surgical history on file.  Family Medical History:       Problem Relation (Age of Onset)    Asthma Mother    Cancer Mother    Elevated Lipids Father    Hypertension (High Blood Pressure) Father    Myelodysplastic syndrome Mother    Stroke Father             Social History     Socioeconomic History    Marital status: Married   Tobacco Use    Smoking status: Never    Smokeless tobacco: Never   Vaping Use    Vaping Use: Never used   Substance and Sexual Activity    Alcohol use: Never    Drug use: Never       Allergies:  Allergies   Allergen Reactions    Venom-Honey Bee Anaphylaxis       Medications:  Current Outpatient Medications   Medication Sig    amLODIPine (NORVASC) 2.5 mg Oral  Tablet Take 1 Tablet (2.5 mg total) by mouth Once a day    aspirin 81 mg Oral Tablet, Chewable Chew 1 Tablet (81 mg total) Once a day    Butalbital-Acetaminophen-Caff 50-300-40 mg Oral Capsule Take 1 Capsule by mouth Every 8 hours as needed    cetirizine (ZYRTEC) 10 mg Oral Tablet Take 1 Tablet (10 mg total) by mouth Once a day    Cholecalciferol, Vitamin D3, 25 mcg (1,000 unit) Oral Capsule Take 5 Capsules (5,000 Units total) by mouth Once a day    cranberry conc/ascorbic acid (SUPER CRANBERRY ORAL) Take 8,400 mg by mouth Once a day Uses OTC 8400    cyanocobalamin (VITAMIN B 12) 1,000 mcg Oral Tablet Take 1 Tablet (1,000 mcg total) by mouth Every 3 days    cyclobenzaprine (FLEXERIL) 10 mg Oral Tablet Take 1 Tablet (10 mg total) by mouth Every night as needed  EPINEPHrine 0.3 mg/0.3 mL Injection Auto-Injector Inject 0.3 mL (0.3 mg total) into the muscle Once, as needed for up to 1 dose    fluticasone propionate (FLONASE) 50 mcg/actuation Nasal Spray, Suspension SPRAY 1 SPRAY INTO EACH NOSTRIL TWICE A DAY    Ibuprofen (MOTRIN) 800 mg Oral Tablet TAKE 1 TABLET EVERY 8 HOURS AS NEEDED FOR PAIN    Magnesium Gluconate 27 mg magnesium (500 mg) Oral Tablet Take 500 mg by mouth Once a day    Naratriptan 2.5 mg Oral Tablet Take 1 Tablet (2.5 mg total) by mouth Once per day as needed for Migraine for up to 1 dose    ondansetron (ZOFRAN) 4 mg Oral Tablet Take 1 Tablet (4 mg total) by mouth Every 8 hours as needed    potassium chloride (K-DUR) 20 mEq Oral Tab Sust.Rel. Particle/Crystal Take 1 Tablet (20 mEq total) by mouth Three times a day    pregabalin (LYRICA) 50 mg Oral Capsule Take 1 Capsule (50 mg total) by mouth Three times a day    RABEprazole (ACIPHEX) 20 mg Oral Tablet, Delayed Release (E.C.) Take 1 Tablet (20 mg total) by mouth Once a day    Saccharomyces boulardii (FLORASTOR) 250 mg Oral Capsule Take 1 Capsule (250 mg total) by mouth Once a day    traZODone (DESYREL) 50 mg Oral Tablet Take 1 Tablet (50 mg total)  by mouth Every night    triamterene-hydroCHLOROthiazide (MAXZIDE-25) 37.5-25 mg Oral Tablet Take 1 Tablet by mouth Every morning    valsartan (DIOVAN) 160 mg Oral Tablet Take 1 Tablet (160 mg total) by mouth Once a day       Problem List:  Patient Active Problem List    Diagnosis Date Noted    Hypertension     Migraines     Vitamin D deficiency     B12 deficiency     Grief reaction with prolonged bereavement     Hypokalemia     Esophageal reflux        Objective   Vitals:  BP 132/76 (Site: Right, Patient Position: Sitting, Cuff Size: Adult)   Pulse 77   Temp 36.9 C (98.4 F) (Skin)   Resp 16   SpO2 99%       Physical Exam:  Physical Exam  Vitals reviewed.   Constitutional:       General: She is not in acute distress.     Appearance: Normal appearance.   HENT:      Mouth/Throat:      Mouth: Mucous membranes are moist.   Cardiovascular:      Rate and Rhythm: Normal rate and regular rhythm.      Pulses: Normal pulses.      Heart sounds: Normal heart sounds. No murmur heard.  Pulmonary:      Effort: Pulmonary effort is normal.      Breath sounds: Normal breath sounds.   Musculoskeletal:      Cervical back: Neck supple. No tenderness.   Lymphadenopathy:      Cervical: No cervical adenopathy.   Skin:     General: Skin is warm and dry.   Neurological:      General: No focal deficit present.      Mental Status: She is alert.          Assessment/Plan     Diagnosis/Plan:    ICD-10-CM    1. Encounter for physical examination related to employment  Z02.89 Tdap Vaccine - Boostrix - >=10 yrs  PPD Placement     PPD Reading        TDaP administered.   PPD placed - advised follow up in no less than 48 hours and no more than 72 hours. She verbalized understanding.     Encounter Medications and Orders  Orders Placed This Encounter    Tdap Vaccine - Boostrix - >=10 yrs    PPD Placement    PPD Reading       Return if symptoms worsen or fail to improve.      Arnell Sieving, DO    This note was partially created using M*Modal  fluency direct system (voice recognition software ) and is inherently subject to errors including those of syntax and "sound- alike" substitutions which may escape proofreading.  In such instances, original meaning may be extrapolated by contextual derivation.Marland Kitchen

## 2022-01-12 NOTE — Nursing Note (Signed)
Immunization administered       Name Date Dose VIS Date Route    PPD 01/12/2022 0.1 mL N/A Intradermal    Site: Left arm    Given By: Arta Bruce, MA    Manufacturer: Other Manufacturer    Lot: [id number]    NDC: 67124580998    Tetanus Toxoid/Diphtheria Toxoid/Acellular Pertussis Vaccine, Adsorbed (Tdap) 01/12/2022 0.5 mL 12/29/2019 Intramuscular    Site: Right deltoid    Given By: Arta Bruce, MA    Manufacturer: GlaxoSmithKline    Lot: [id number]    NDC: 05397673419

## 2022-01-14 LAB — POCT PPD READING

## 2022-01-27 ENCOUNTER — Other Ambulatory Visit (RURAL_HEALTH_CENTER): Payer: Self-pay | Admitting: Family Medicine

## 2022-01-27 DIAGNOSIS — M5410 Radiculopathy, site unspecified: Secondary | ICD-10-CM

## 2022-01-27 MED ORDER — PREGABALIN 50 MG CAPSULE
50.0000 mg | ORAL_CAPSULE | Freq: Three times a day (TID) | ORAL | 0 refills | Status: DC
Start: 2022-01-27 — End: 2022-03-09

## 2022-01-30 ENCOUNTER — Other Ambulatory Visit (RURAL_HEALTH_CENTER): Payer: Self-pay | Admitting: Family Medicine

## 2022-01-30 DIAGNOSIS — E876 Hypokalemia: Secondary | ICD-10-CM

## 2022-02-25 ENCOUNTER — Telehealth (RURAL_HEALTH_CENTER): Payer: Self-pay | Admitting: Family Medicine

## 2022-02-25 NOTE — Nursing Note (Signed)
Patient called requesting a new order for medical message that Dr. Faye Ramsay wrote on last year but that it ran out in July and would like to come by and pick the order up since she just works over at [employer].

## 2022-03-06 ENCOUNTER — Other Ambulatory Visit (RURAL_HEALTH_CENTER): Payer: Self-pay | Admitting: Family Medicine

## 2022-03-06 DIAGNOSIS — M5410 Radiculopathy, site unspecified: Secondary | ICD-10-CM

## 2022-03-09 ENCOUNTER — Other Ambulatory Visit (RURAL_HEALTH_CENTER): Payer: Self-pay | Admitting: Family Medicine

## 2022-03-09 DIAGNOSIS — M5410 Radiculopathy, site unspecified: Secondary | ICD-10-CM

## 2022-03-09 MED ORDER — PREGABALIN 50 MG CAPSULE
50.0000 mg | ORAL_CAPSULE | Freq: Three times a day (TID) | ORAL | 0 refills | Status: DC
Start: 2022-03-09 — End: 2022-03-19

## 2022-03-09 NOTE — Telephone Encounter (Signed)
Thank you :)

## 2022-03-09 NOTE — Telephone Encounter (Signed)
-----   Message from [place], DO sent at 03/09/2022  8:23 AM EDT -----  Regarding: Appointment  I have seen her for a TDAP and employment physical. I got a request to refill her Lyrica and she requested an Rx for massage therapy. She will need an appointment to discuss chronic problems. I did refill the Lyrica for 30 days.

## 2022-03-09 NOTE — Telephone Encounter (Signed)
I spoke to the patient and offered several appts. Her schedule is very busy so we agreed on 03/19/22 at 8:00am.

## 2022-03-19 ENCOUNTER — Ambulatory Visit (RURAL_HEALTH_CENTER): Payer: Self-pay | Admitting: Family Medicine

## 2022-03-19 ENCOUNTER — Other Ambulatory Visit: Payer: Self-pay

## 2022-03-19 ENCOUNTER — Encounter (RURAL_HEALTH_CENTER): Payer: Self-pay | Admitting: Family Medicine

## 2022-03-19 ENCOUNTER — Ambulatory Visit (RURAL_HEALTH_CENTER): Payer: 59 | Attending: Family Medicine | Admitting: Family Medicine

## 2022-03-19 VITALS — BP 119/67 | HR 85 | Temp 98.0°F | Resp 16 | Wt 170.0 lb

## 2022-03-19 DIAGNOSIS — L84 Corns and callosities: Secondary | ICD-10-CM | POA: Insufficient documentation

## 2022-03-19 DIAGNOSIS — M5410 Radiculopathy, site unspecified: Secondary | ICD-10-CM | POA: Insufficient documentation

## 2022-03-19 DIAGNOSIS — I10 Essential (primary) hypertension: Secondary | ICD-10-CM | POA: Insufficient documentation

## 2022-03-19 DIAGNOSIS — G4700 Insomnia, unspecified: Secondary | ICD-10-CM | POA: Insufficient documentation

## 2022-03-19 DIAGNOSIS — T7840XA Allergy, unspecified, initial encounter: Secondary | ICD-10-CM

## 2022-03-19 DIAGNOSIS — G43909 Migraine, unspecified, not intractable, without status migrainosus: Secondary | ICD-10-CM | POA: Insufficient documentation

## 2022-03-19 DIAGNOSIS — K219 Gastro-esophageal reflux disease without esophagitis: Secondary | ICD-10-CM | POA: Insufficient documentation

## 2022-03-19 MED ORDER — AMLODIPINE 2.5 MG TABLET
2.5000 mg | ORAL_TABLET | Freq: Every day | ORAL | 1 refills | Status: DC
Start: 2022-03-19 — End: 2022-10-05

## 2022-03-19 MED ORDER — VALSARTAN 160 MG TABLET
160.0000 mg | ORAL_TABLET | Freq: Every day | ORAL | 1 refills | Status: DC
Start: 2022-03-19 — End: 2022-10-22

## 2022-03-19 MED ORDER — BUTALBITAL-ACETAMINOPHEN-CAFFEINE 50 MG-300 MG-40 MG CAPSULE
1.0000 | ORAL_CAPSULE | Freq: Three times a day (TID) | ORAL | 1 refills | Status: AC | PRN
Start: 2022-03-19 — End: ?

## 2022-03-19 MED ORDER — IBUPROFEN 800 MG TABLET
ORAL_TABLET | ORAL | 1 refills | Status: DC
Start: 2022-03-19 — End: 2022-06-24

## 2022-03-19 MED ORDER — PREGABALIN 50 MG CAPSULE
50.0000 mg | ORAL_CAPSULE | Freq: Three times a day (TID) | ORAL | 3 refills | Status: DC
Start: 2022-03-19 — End: 2022-04-30

## 2022-03-19 MED ORDER — RABEPRAZOLE 20 MG TABLET,DELAYED RELEASE
20.0000 mg | DELAYED_RELEASE_TABLET | Freq: Every day | ORAL | 1 refills | Status: DC
Start: 2022-03-19 — End: 2022-09-21

## 2022-03-19 MED ORDER — FLUTICASONE PROPIONATE 50 MCG/ACTUATION NASAL SPRAY,SUSPENSION
2.0000 | Freq: Every day | NASAL | 2 refills | Status: AC
Start: 2022-03-19 — End: ?

## 2022-03-19 MED ORDER — TRAZODONE 50 MG TABLET
50.0000 mg | ORAL_TABLET | Freq: Every evening | ORAL | 1 refills | Status: DC
Start: 2022-03-19 — End: 2022-11-25

## 2022-03-19 MED ORDER — CYCLOBENZAPRINE 10 MG TABLET
10.0000 mg | ORAL_TABLET | Freq: Every evening | ORAL | 1 refills | Status: DC | PRN
Start: 2022-03-19 — End: 2022-11-25

## 2022-03-19 MED ORDER — TRIAMTERENE 37.5 MG-HYDROCHLOROTHIAZIDE 25 MG TABLET
1.0000 | ORAL_TABLET | Freq: Every morning | ORAL | 1 refills | Status: DC
Start: 2022-03-19 — End: 2022-10-22

## 2022-03-19 NOTE — Nursing Note (Signed)
Pt here for follow up and refills    on Lyrica and new massage therapy rx .  Delaney Meigs, LPN

## 2022-03-19 NOTE — Progress Notes (Signed)
FAMILY MEDICINE, [place]  [address]  [place] [address]  Operated by [facility]     Name: Cassandra Elliott MRN:  [id number]   Date: 03/19/2022 Age: 56 y.o.          Provider: Lowella Grip, DO    Reason for visit: Medication Check and Other (Right 4th and 5th toe issue)  Nursing Notes:   Delaney Meigs, LPN  23/53/61 [id number]  Signed  Pt here for follow up and refills    on Lyrica and new massage therapy rx .  Delaney Meigs, LPN        History of Present Illness:  Cassandra Elliott is a 56 y.o. female presents today for a 3 month/6 month follow up appointment.     Toe Problem:   " A while"   She states on the Lateral aspect of right four toe there is a callous or corn there which has been irritating her and only getting worse.     CHRONIC CONDITIONS     Essential hypertension:  Has been doing well  Valsartan 160 mg daily  Amlodipine 2.5 mg daily  Triamterene/HCTZ 37.5/25 mg daily     GERD:  Controlled.  Rabeprazole 20 mg daily     Lumbar radiculopathy:  She continues to work regularly and feels she is able to continue doing that due to the medication.   She reports two current disc herniations at T10-T11 and L1-L2  History of L5-S1 herniation  Status post L4-L5 fusion  Lyrica 50 mg 3 times daily no drowsiness or problems.   Massage Order - Dana Deskins. 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 in a short time frame  Still goes to grief counseling     Insomnia:  Trazodone only PRN     Historical Data    Past Medical  History:  Past Medical History:   Diagnosis Date    B12 deficiency     Biceps tendinitis     Chronic radicular low back pain     Costochondritis     Esophageal reflux     Grief reaction with prolonged bereavement     Hypertension     Hypokalemia     Migraines     Rash     Somatic dysfunction of rib cage region     Vitamin D deficiency          Allergies:  Allergies   Allergen Reactions    Venom-Honey Bee Anaphylaxis     Medications:  Current Outpatient Medications   Medication Sig    amLODIPine (NORVASC) 2.5 mg Oral Tablet Take 1 Tablet (2.5 mg total) by mouth Once a day    aspirin 81 mg Oral Tablet, Chewable Chew 1 Tablet (81 mg total) Once a day    Butalbital-Acetaminophen-Caff 50-300-40 mg Oral Capsule Take 1 Capsule by mouth Every 8 hours as needed    cetirizine (  ZYRTEC) 10 mg Oral Tablet Take 1 Tablet (10 mg total) by mouth Once a day    Cholecalciferol, Vitamin D3, 25 mcg (1,000 unit) Oral Capsule Take 5 Capsules (5,000 Units total) by mouth Once a day    cranberry conc/ascorbic acid (SUPER CRANBERRY ORAL) Take 8,400 mg by mouth Once a day Uses OTC 8400    cyanocobalamin (VITAMIN B 12) 1,000 mcg Oral Tablet Take 1 Tablet (1,000 mcg total) by mouth Every 3 days    cyclobenzaprine (FLEXERIL) 10 mg Oral Tablet Take 1 Tablet (10 mg total) by mouth Every night as needed    EPINEPHrine 0.3 mg/0.3 mL Injection Auto-Injector Inject 0.3 mL (0.3 mg total) into the muscle Once, as needed for up to 1 dose    fluticasone propionate (FLONASE) 50 mcg/actuation Nasal Spray, Suspension SPRAY 1 SPRAY INTO EACH NOSTRIL TWICE A DAY    Ibuprofen (MOTRIN) 800 mg Oral Tablet TAKE 1 TABLET EVERY 8 HOURS AS NEEDED FOR PAIN    KLOR-CON M20 20 mEq Oral Tab Sust.Rel. Particle/Crystal TAKE 1 TABLET (20 MEQ TOTAL) BY MOUTH THREE TIMES A DAY    Magnesium Gluconate 27 mg magnesium (500 mg) Oral Tablet Take 500 mg by mouth Once a day    Naratriptan 2.5 mg Oral Tablet Take 1 Tablet (2.5 mg total) by mouth Once per day as needed for Migraine  for up to 1 dose    ondansetron (ZOFRAN) 4 mg Oral Tablet Take 1 Tablet (4 mg total) by mouth Every 8 hours as needed    pregabalin (LYRICA) 50 mg Oral Capsule Take 1 Capsule (50 mg total) by mouth Three times a day    RABEprazole (ACIPHEX) 20 mg Oral Tablet, Delayed Release (E.C.) Take 1 Tablet (20 mg total) by mouth Once a day    Saccharomyces boulardii (FLORASTOR) 250 mg Oral Capsule Take 1 Capsule (250 mg total) by mouth Once a day    traZODone (DESYREL) 50 mg Oral Tablet Take 1 Tablet (50 mg total) by mouth Every night    triamterene-hydroCHLOROthiazide (MAXZIDE-25) 37.5-25 mg Oral Tablet Take 1 Tablet by mouth Every morning    valsartan (DIOVAN) 160 mg Oral Tablet Take 1 Tablet (160 mg total) by mouth Once a day     Social History:  Social History     Socioeconomic History    Marital status: Married   Tobacco Use    Smoking status: Never    Smokeless tobacco: Never   Vaping Use    Vaping Use: Never used   Substance and Sexual Activity    Alcohol use: Never    Drug use: Never           Review of Systems:  Review of Systems   Constitutional:  Negative for fatigue, fever and unexpected weight change.   Respiratory:  Negative for cough, chest tightness and shortness of breath.    Cardiovascular:  Negative for chest pain and leg swelling.   Gastrointestinal:  Negative for abdominal pain, constipation, diarrhea, nausea and vomiting.   Musculoskeletal:  Positive for arthralgias and back pain.   Neurological:  Negative for light-headedness and headaches.   Psychiatric/Behavioral:  Positive for dysphoric mood.          Physical Exam:  Vital Signs:  Vitals:    03/19/22 1615   BP: 119/67   Pulse: 85   Resp: 16   Temp: 36.7 C (98 F)   TempSrc: Skin   SpO2: 98%   Weight: 77.1 kg (170 lb)  Physical Exam  Vitals reviewed.   Constitutional:       General: She is not in acute distress.     Appearance: Normal appearance.   HENT:      Mouth/Throat:      Mouth: Mucous membranes are moist.   Cardiovascular:      Rate and  Rhythm: Normal rate and regular rhythm.      Pulses: Normal pulses.      Heart sounds: Normal heart sounds. No murmur heard.  Pulmonary:      Effort: Pulmonary effort is normal.      Breath sounds: Normal breath sounds.   Abdominal:      General: Bowel sounds are normal.      Palpations: Abdomen is soft.      Tenderness: There is no abdominal tenderness. There is no guarding.   Musculoskeletal:         General: No swelling or deformity.      Cervical back: Neck supple.        Feet:    Feet:      Comments: Toughed skin/corn noted at circled area above  Skin:     General: Skin is warm and dry.   Neurological:      General: No focal deficit present.      Mental Status: She is alert.   Psychiatric:         Mood and Affect: Mood normal.         Behavior: Behavior normal.           Assessment:    ICD-10-CM    1. Corn of toe  L84 Referral to External Provider      2. Hypertension, unspecified type  I10 amLODIPine (NORVASC) 2.5 mg Oral Tablet     triamterene-hydroCHLOROthiazide (MAXZIDE-25) 37.5-25 mg Oral Tablet     valsartan (DIOVAN) 160 mg Oral Tablet      3. Migraines  G43.909 Butalbital-Acetaminophen-Caff 50-300-40 mg Oral Capsule      4. Back pain with radiculopathy  M54.10 cyclobenzaprine (FLEXERIL) 10 mg Oral Tablet     Ibuprofen (MOTRIN) 800 mg Oral Tablet     pregabalin (LYRICA) 50 mg Oral Capsule      5. Allergies  T78.40XA fluticasone propionate (FLONASE) 50 mcg/actuation Nasal Spray, Suspension      6. Gastroesophageal reflux disease, unspecified whether esophagitis present  K21.9 RABEprazole (ACIPHEX) 20 mg Oral Tablet, Delayed Release (E.C.)      7. Insomnia, unspecified type  G47.00 traZODone (DESYREL) 50 mg Oral Tablet            Plan:    1. Corn of toe  Referral to podiatry placed.    - Referral to External Provider    2. Hypertension, unspecified type  Continue amlodipin, triamterene-HCTZ and valsartan as previously prescribed. Recommend home blood pressure monitoring and bring in logs to follow up  appointment. I also recommend that you bring in your blood pressure machine to compare to ours approximately once/year for accuracy. Follow up if your blood pressure is persistently elevated or too low or you are experiencing symptoms of either.   Focus on following the DASH diet (low sodium) to help control blood pressure.     The DASH diet is a balanced eating plan that gives choices of what to eat. The diet helps create a heart-healthy eating style for life. There's no need for special foods or drinks. Foods in the diet are at grocery stores and in most restaurants.     When  following DASH, it is important to choose foods that are:  - Rich in potassium, calcium, magnesium, fiber and protein.  - Low in saturated fat.  - Low in salt.    - amLODIPine (NORVASC) 2.5 mg Oral Tablet; Take 1 Tablet (2.5 mg total) by mouth Once a day  Dispense: 90 Tablet; Refill: 1  - triamterene-hydroCHLOROthiazide (MAXZIDE-25) 37.5-25 mg Oral Tablet; Take 1 Tablet by mouth Every morning  Dispense: 90 Tablet; Refill: 1  - valsartan (DIOVAN) 160 mg Oral Tablet; Take 1 Tablet (160 mg total) by mouth Once a day  Dispense: 90 Tablet; Refill: 1    3. Migraines    - Butalbital-Acetaminophen-Caff 50-300-40 mg Oral Capsule; Take 1 Capsule by mouth Every 8 hours as needed  Dispense: 30 Capsule; Refill: 1    4. Back pain with radiculopathy  New order for massage therapy provided to patient.     - cyclobenzaprine (FLEXERIL) 10 mg Oral Tablet; Take 1 Tablet (10 mg total) by mouth Every night as needed  Dispense: 30 Tablet; Refill: 1  - Ibuprofen (MOTRIN) 800 mg Oral Tablet; TAKE 1 TABLET EVERY 8 HOURS AS NEEDED FOR PAIN  Dispense: 90 Tablet; Refill: 1  - pregabalin (LYRICA) 50 mg Oral Capsule; Take 1 Capsule (50 mg total) by mouth Three times a day  Dispense: 90 Capsule; Refill: 3    5. Allergies    - fluticasone propionate (FLONASE) 50 mcg/actuation Nasal Spray, Suspension; Administer 2 Sprays into each nostril Once a day  Dispense: 16 g; Refill:  2    6. Gastroesophageal reflux disease, unspecified whether esophagitis present    - RABEprazole (ACIPHEX) 20 mg Oral Tablet, Delayed Release (E.C.); Take 1 Tablet (20 mg total) by mouth Once a day  Dispense: 90 Tablet; Refill: 1    7. Insomnia, unspecified type    - traZODone (DESYREL) 50 mg Oral Tablet; Take 1 Tablet (50 mg total) by mouth Every night  Dispense: 90 Tablet; Refill: 1         Return in about 4 months (around 07/20/2022) for In Person Visit.    Arnell Sieving, DO     Portions of this note may be dictated using voice recognition software or a dictation service. Variances in spelling and vocabulary are possible and unintentional. Not all errors are caught/corrected. Please notify the [facility] Parkin if any discrepancies are noted or if the meaning of any statement is not clear.

## 2022-03-20 ENCOUNTER — Encounter (RURAL_HEALTH_CENTER): Payer: Self-pay | Admitting: Family Medicine

## 2022-03-20 ENCOUNTER — Ambulatory Visit (RURAL_HEALTH_CENTER): Payer: Self-pay | Admitting: Physician Assistant

## 2022-03-26 ENCOUNTER — Other Ambulatory Visit (RURAL_HEALTH_CENTER): Payer: Self-pay | Admitting: Family Medicine

## 2022-03-26 DIAGNOSIS — G8929 Other chronic pain: Secondary | ICD-10-CM

## 2022-03-26 DIAGNOSIS — M542 Cervicalgia: Secondary | ICD-10-CM

## 2022-04-02 ENCOUNTER — Telehealth (RURAL_HEALTH_CENTER): Payer: Self-pay | Admitting: Family Medicine

## 2022-04-02 NOTE — Telephone Encounter (Signed)
04/02/2022 Pt has an appt. 04/08/2022 @ 11. Roxan Diesel

## 2022-04-07 ENCOUNTER — Ambulatory Visit (RURAL_HEALTH_CENTER): Payer: Self-pay | Admitting: Family Medicine

## 2022-04-20 ENCOUNTER — Other Ambulatory Visit (RURAL_HEALTH_CENTER): Payer: Self-pay | Admitting: Family Medicine

## 2022-04-20 DIAGNOSIS — G43909 Migraine, unspecified, not intractable, without status migrainosus: Secondary | ICD-10-CM

## 2022-04-30 ENCOUNTER — Other Ambulatory Visit (RURAL_HEALTH_CENTER): Payer: Self-pay | Admitting: Family Medicine

## 2022-04-30 DIAGNOSIS — M5410 Radiculopathy, site unspecified: Secondary | ICD-10-CM

## 2022-05-08 ENCOUNTER — Other Ambulatory Visit: Payer: Self-pay

## 2022-06-01 ENCOUNTER — Other Ambulatory Visit (RURAL_HEALTH_CENTER): Payer: Self-pay | Admitting: Family Medicine

## 2022-06-01 DIAGNOSIS — M5410 Radiculopathy, site unspecified: Secondary | ICD-10-CM

## 2022-06-05 ENCOUNTER — Other Ambulatory Visit (RURAL_HEALTH_CENTER): Payer: Self-pay | Admitting: Family Medicine

## 2022-06-05 DIAGNOSIS — G43909 Migraine, unspecified, not intractable, without status migrainosus: Secondary | ICD-10-CM

## 2022-06-24 ENCOUNTER — Other Ambulatory Visit (RURAL_HEALTH_CENTER): Payer: Self-pay | Admitting: Family Medicine

## 2022-06-24 DIAGNOSIS — M5410 Radiculopathy, site unspecified: Secondary | ICD-10-CM

## 2022-07-23 ENCOUNTER — Ambulatory Visit (RURAL_HEALTH_CENTER): Payer: 59 | Attending: Family Medicine | Admitting: Family Medicine

## 2022-07-23 ENCOUNTER — Encounter (RURAL_HEALTH_CENTER): Payer: Self-pay | Admitting: Family Medicine

## 2022-07-23 ENCOUNTER — Other Ambulatory Visit: Payer: Self-pay

## 2022-07-23 VITALS — BP 137/86 | HR 88 | Temp 98.4°F | Resp 20 | Wt 175.0 lb

## 2022-07-23 DIAGNOSIS — I10 Essential (primary) hypertension: Secondary | ICD-10-CM | POA: Insufficient documentation

## 2022-07-23 DIAGNOSIS — G43909 Migraine, unspecified, not intractable, without status migrainosus: Secondary | ICD-10-CM | POA: Insufficient documentation

## 2022-07-23 DIAGNOSIS — E538 Deficiency of other specified B group vitamins: Secondary | ICD-10-CM | POA: Insufficient documentation

## 2022-07-23 DIAGNOSIS — Z79899 Other long term (current) drug therapy: Secondary | ICD-10-CM | POA: Insufficient documentation

## 2022-07-23 DIAGNOSIS — M5116 Intervertebral disc disorders with radiculopathy, lumbar region: Secondary | ICD-10-CM | POA: Insufficient documentation

## 2022-07-23 DIAGNOSIS — E876 Hypokalemia: Secondary | ICD-10-CM | POA: Insufficient documentation

## 2022-07-23 DIAGNOSIS — R635 Abnormal weight gain: Secondary | ICD-10-CM | POA: Insufficient documentation

## 2022-07-23 DIAGNOSIS — R7989 Other specified abnormal findings of blood chemistry: Secondary | ICD-10-CM | POA: Insufficient documentation

## 2022-07-23 DIAGNOSIS — E559 Vitamin D deficiency, unspecified: Secondary | ICD-10-CM | POA: Insufficient documentation

## 2022-07-23 DIAGNOSIS — N809 Endometriosis, unspecified: Secondary | ICD-10-CM | POA: Insufficient documentation

## 2022-07-23 MED ORDER — POTASSIUM CHLORIDE ER 20 MEQ TABLET,EXTENDED RELEASE(PART/CRYST)
20.0000 meq | ORAL_TABLET | Freq: Three times a day (TID) | ORAL | 1 refills | Status: DC
Start: 2022-07-23 — End: 2022-11-25

## 2022-07-23 NOTE — Progress Notes (Signed)
FAMILY MEDICINE, [facility]  [address]  [place] [address]  Operated by [facility]     Name: Cassandra Elliott MRN:  [id number]   Date: 07/23/2022 Age: 57 y.o.          Provider: Arnell Sieving, DO    Reason for visit: Follow Up 4 Months (Would like thyroid panel/Referral to [facility] for Gyn check. Having heavy periods/)  There are no exam notes on file for this visit.        History of Present Illness:  Cassandra Elliott is a 57 y.o. female presents today for a 4 month follow up appointment.     Toe Problem:   [facility] - note reviewed under media.   Diagnosed with Hammertoes.   She is wearing supportive toe pads. Surgical consideration was discussed if symptoms persist.     CHRONIC CONDITIONS     Essential hypertension:  Has been doing well - she reports hers is 120/70  Valsartan 160 mg daily  Amlodipine 2.5 mg daily  Triamterene/HCTZ 37.5/25 mg daily     GERD:  Controlled.  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 On her feet a lot  She reports two current disc herniations at T10-T11 and L1-L2  History of L5-S1 herniation  Status post L4-L5 fusion  Lyrica 50 mg 3 times daily no drowsiness or problems.   Massage Order previously provided - Annabelle Harman Deskins. She reports that the massages help more than anything.      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 She reports taking the naratriptan maybe once a month. Will take Fioricet before the naratriptan as the naratriptan makes her drowsy and feel poorly.   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     Insomnia:  Trazodone only PRN     She requests referral to Dr. Lockie Mola for well woman examination/updates.     Historical Data    Past Medical History:  Past Medical History:   Diagnosis Date    B12 deficiency     Biceps tendinitis     Bulging lumbar disc     L3-L4 & T10-T11    Chronic radicular low back pain     Costochondritis     Endometriosis     Esophageal reflux     Grief reaction with prolonged bereavement     Herniated disc, cervical     Hypertension     Hypokalemia     Migraines     Miscarriage 1986    Rash     Renal calculi     Somatic dysfunction of rib cage region     Umbilical hernia     @ birth    Vitamin D deficiency  Allergies:  Allergies   Allergen Reactions    Venom-Honey Bee Anaphylaxis     Medications:  Current Outpatient Medications   Medication Sig    amLODIPine (NORVASC) 2.5 mg Oral Tablet Take 1 Tablet (2.5 mg total) by mouth Once a day    aspirin 81 mg Oral Tablet, Chewable Chew 1 Tablet (81 mg total) Once a day    Butalbital-Acetaminophen-Caff 50-300-40 mg Oral Capsule Take 1 Capsule by mouth Every 8 hours as needed    cetirizine (ZYRTEC) 10 mg Oral Tablet Take 1 Tablet (10 mg total) by mouth Once a day    Cholecalciferol, Vitamin D3, 25 mcg (1,000 unit) Oral Capsule Take 5 Capsules (5,000 Units total) by mouth Once a day    cranberry conc/ascorbic acid (SUPER CRANBERRY ORAL) Take 8,400 mg by mouth Once a day Uses OTC 8400    cyanocobalamin (VITAMIN B 12) 1,000 mcg Oral Tablet Take 1 Tablet (1,000 mcg total) by mouth Every 3 days    cyclobenzaprine (FLEXERIL) 10 mg Oral Tablet Take 1 Tablet (10 mg total) by mouth Every night as needed    EPINEPHrine 0.3 mg/0.3 mL Injection Auto-Injector Inject 0.3 mL (0.3 mg total) into the muscle Once, as needed for up to 1 dose    fluticasone  propionate (FLONASE) 50 mcg/actuation Nasal Spray, Suspension Administer 2 Sprays into each nostril Once a day    Ibuprofen (MOTRIN) 800 mg Oral Tablet TAKE 1 TABLET BY MOUTH EVERY 8 HOURS AS NEEDED FOR PAIN    KLOR-CON M20 20 mEq Oral Tab Sust.Rel. Particle/Crystal TAKE 1 TABLET (20 MEQ TOTAL) BY MOUTH THREE TIMES A DAY    Magnesium Gluconate 27 mg magnesium (500 mg) Oral Tablet Take 500 mg by mouth Once a day    Naratriptan 2.5 mg Oral Tablet TAKE 1 TABLET (2.5 MG TOTAL) BY MOUTH ONCE PER DAY AS NEEDED FOR MIGRAINE FOR UP TO 1 DOSE    ondansetron (ZOFRAN) 4 mg Oral Tablet TAKE 1 TABLET BY MOUTH EVERY 8 HOURS AS NEEDED    pregabalin (LYRICA) 50 mg Oral Capsule TAKE 1 CAPSULE (50 MG TOTAL) BY MOUTH THREE TIMES A DAY    RABEprazole (ACIPHEX) 20 mg Oral Tablet, Delayed Release (E.C.) Take 1 Tablet (20 mg total) by mouth Once a day    Saccharomyces boulardii (FLORASTOR) 250 mg Oral Capsule Take 1 Capsule (250 mg total) by mouth Once a day    traZODone (DESYREL) 50 mg Oral Tablet Take 1 Tablet (50 mg total) by mouth Every night    triamterene-hydroCHLOROthiazide (MAXZIDE-25) 37.5-25 mg Oral Tablet Take 1 Tablet by mouth Every morning    valsartan (DIOVAN) 160 mg Oral Tablet Take 1 Tablet (160 mg total) by mouth Once a day     Social History:  Social History     Socioeconomic History    Marital status: Married   Tobacco Use    Smoking status: Never    Smokeless tobacco: Never   Vaping Use    Vaping status: Never Used   Substance and Sexual Activity    Alcohol use: Never    Drug use: Never           Review of Systems:  Review of Systems   Constitutional:  Negative for fatigue, fever and unexpected weight change.   Respiratory:  Negative for cough, chest tightness and shortness of breath.    Cardiovascular:  Negative for chest pain and leg swelling.   Gastrointestinal:  Negative for abdominal pain, constipation, diarrhea, nausea and vomiting.  Musculoskeletal:  Positive for arthralgias and back pain.   Neurological:   Negative for light-headedness and headaches.   Psychiatric/Behavioral:  Positive for dysphoric mood.          Physical Exam:  Vital Signs:  Vitals:    07/23/22 1612   BP: 137/86   Pulse: 88   Resp: 20   Temp: 36.9 C (98.4 F)   SpO2: 97%   Weight: 79.4 kg (175 lb)     Physical Exam  Vitals reviewed.   Constitutional:       General: She is not in acute distress.     Appearance: Normal appearance.   HENT:      Mouth/Throat:      Mouth: Mucous membranes are moist.   Cardiovascular:      Rate and Rhythm: Normal rate and regular rhythm.      Pulses: Normal pulses.      Heart sounds: Normal heart sounds. No murmur heard.  Pulmonary:      Effort: Pulmonary effort is normal.      Breath sounds: Normal breath sounds.   Abdominal:      General: Bowel sounds are normal.      Palpations: Abdomen is soft.      Tenderness: There is no abdominal tenderness. There is no guarding.   Musculoskeletal:         General: No swelling or deformity.      Cervical back: Neck supple.   Skin:     General: Skin is warm and dry.   Neurological:      General: No focal deficit present.      Mental Status: She is alert.   Psychiatric:         Mood and Affect: Mood normal.         Behavior: Behavior normal.       Last labs completed in May 2023. Labs to be ordered    Assessment:    ICD-10-CM    1. Essential hypertension  I10 CBC/DIFF     LIPID PANEL      2. Hypokalemia  E87.6 potassium chloride (KLOR-CON M20) 20 mEq Oral Tab Sust.Rel. Particle/Crystal     COMPREHENSIVE METABOLIC PANEL, NON-FASTING      3. Weight gain  R63.5 THYROID STIMULATING HORMONE WITH FREE T4 REFLEX     LIPID PANEL      4. Migraines  G43.909       5. Vitamin D deficiency  E55.9 VITAMIN D 25 TOTAL      6. B12 deficiency  E53.8 VITAMIN B12      7. Endometriosis  N80.9 Referral to External Provider              Plan:  1. Hypokalemia    - potassium chloride (KLOR-CON M20) 20 mEq Oral Tab Sust.Rel. Particle/Crystal; Take 1 Tablet (20 mEq total) by mouth Three times a day  Dispense:  270 Tablet; Refill: 1  - COMPREHENSIVE METABOLIC PANEL, NON-FASTING; Future    2. Weight gain  Labs ordered for evaluation     - THYROID STIMULATING HORMONE WITH FREE T4 REFLEX; Future  - LIPID PANEL; Future    3. Migraines  Fioricet used PRN prior to taking naratriptan. No side effects associated with Fioricet.     4. Vitamin D deficiency    - VITAMIN D 25 TOTAL; Future    5. B12 deficiency    - VITAMIN B12; Future    6. Essential hypertension  Continue amlodipine, Valsartan and Triamterene-HCTZ as previously  prescribed. Recommend home blood pressure monitoring and bring in logs to follow up appointment. I also recommend that you bring in your blood pressure machine to compare to ours approximately once/year for accuracy. Follow up if your blood pressure is persistently elevated or too low or you are experiencing symptoms of either.   Focus on following the DASH diet (low sodium) to help control blood pressure.     The DASH diet is a balanced eating plan that gives choices of what to eat. The diet helps create a heart-healthy eating style for life. There's no need for special foods or drinks. Foods in the diet are at grocery stores and in most restaurants.     When following DASH, it is important to choose foods that are:  - Rich in potassium, calcium, magnesium, fiber and protein.  - Low in saturated fat.  - Low in salt.    - CBC/DIFF; Future  - LIPID PANEL; Future    8. Endometriosis    - Referral to External Provider           Return in about 4 months (around 11/21/2022) for In Person Visit - CDM.    Arnell Sieving, DO     Portions of this note may be dictated using voice recognition software or a dictation service. Variances in spelling and vocabulary are possible and unintentional. Not all errors are caught/corrected. Please notify the [facility] Parkin if any discrepancies are noted or if the meaning of any statement is not clear.

## 2022-07-28 ENCOUNTER — Ambulatory Visit: Payer: 59 | Attending: Family Medicine | Admitting: Family Medicine

## 2022-07-28 ENCOUNTER — Other Ambulatory Visit: Payer: Self-pay

## 2022-07-28 ENCOUNTER — Ambulatory Visit (RURAL_HEALTH_CENTER): Payer: 59 | Attending: Family Medicine

## 2022-07-28 DIAGNOSIS — E559 Vitamin D deficiency, unspecified: Secondary | ICD-10-CM

## 2022-07-28 DIAGNOSIS — E538 Deficiency of other specified B group vitamins: Secondary | ICD-10-CM | POA: Insufficient documentation

## 2022-07-28 DIAGNOSIS — I10 Essential (primary) hypertension: Secondary | ICD-10-CM | POA: Insufficient documentation

## 2022-07-28 DIAGNOSIS — E876 Hypokalemia: Secondary | ICD-10-CM | POA: Insufficient documentation

## 2022-07-28 DIAGNOSIS — R635 Abnormal weight gain: Secondary | ICD-10-CM | POA: Insufficient documentation

## 2022-07-28 LAB — CBC WITH DIFF
BASOPHIL #: 0 10*3/uL (ref 0.00–0.10)
BASOPHIL %: 0 % (ref 0–1)
EOSINOPHIL #: 0.3 10*3/uL (ref 0.00–0.50)
EOSINOPHIL %: 4 %
HCT: 34.6 % (ref 31.2–41.9)
HGB: 12 g/dL (ref 10.9–14.3)
LYMPHOCYTE #: 3.2 10*3/uL — ABNORMAL HIGH (ref 1.00–3.00)
LYMPHOCYTE %: 47 % — ABNORMAL HIGH (ref 16–44)
MCH: 31.9 pg (ref 24.7–32.8)
MCHC: 34.5 g/dL (ref 32.3–35.6)
MCV: 92.3 fL (ref 75.5–95.3)
MONOCYTE #: 0.4 10*3/uL (ref 0.30–1.00)
MONOCYTE %: 6 % (ref 5–13)
MPV: 8.1 fL (ref 7.9–10.8)
NEUTROPHIL #: 3 10*3/uL (ref 1.85–7.80)
NEUTROPHIL %: 43 % (ref 43–77)
PLATELETS: 322 10*3/uL (ref 140–440)
RBC: 3.75 10*6/uL (ref 3.63–4.92)
RDW: 12.8 % (ref 12.3–17.7)
WBC: 6.9 10*3/uL (ref 3.8–11.8)

## 2022-07-28 LAB — COMPREHENSIVE METABOLIC PANEL, NON-FASTING
ALBUMIN/GLOBULIN RATIO: 1.7 — ABNORMAL HIGH (ref 0.8–1.4)
ALBUMIN: 4.3 g/dL (ref 3.5–5.7)
ALKALINE PHOSPHATASE: 106 U/L — ABNORMAL HIGH (ref 34–104)
ALT (SGPT): 11 U/L (ref 7–52)
ANION GAP: 6 mmol/L (ref 4–13)
AST (SGOT): 16 U/L (ref 13–39)
BILIRUBIN TOTAL: 0.6 mg/dL (ref 0.3–1.2)
BUN/CREA RATIO: 27 — ABNORMAL HIGH (ref 6–22)
BUN: 20 mg/dL (ref 7–25)
CALCIUM, CORRECTED: 9.2 mg/dL (ref 8.9–10.8)
CALCIUM: 9.4 mg/dL (ref 8.6–10.3)
CHLORIDE: 102 mmol/L (ref 98–107)
CO2 TOTAL: 26 mmol/L (ref 21–31)
CREATININE: 0.75 mg/dL (ref 0.60–1.30)
ESTIMATED GFR: 93 mL/min/{1.73_m2} (ref >59)
GLOBULIN: 2.5 — ABNORMAL LOW (ref 2.9–5.4)
GLUCOSE: 92 mg/dL (ref 74–109)
OSMOLALITY, CALCULATED: 271 mOsm/kg (ref 270–290)
POTASSIUM: 3.7 mmol/L (ref 3.5–5.1)
PROTEIN TOTAL: 6.8 g/dL (ref 6.4–8.9)
SODIUM: 134 mmol/L — ABNORMAL LOW (ref 136–145)

## 2022-07-28 LAB — LIPID PANEL
CHOL/HDL RATIO: 3.9
CHOLESTEROL: 181 mg/dL (ref <200)
HDL CHOL: 47 mg/dL (ref 23–92)
LDL CALC: 114 mg/dL — ABNORMAL HIGH (ref 0–100)
TRIGLYCERIDES: 101 mg/dL (ref <=150)
VLDL CALC: 20 mg/dL (ref 0–50)

## 2022-07-28 LAB — VITAMIN B12: VITAMIN B 12: 1084 pg/mL — ABNORMAL HIGH (ref 180–914)

## 2022-07-28 LAB — THYROID STIMULATING HORMONE WITH FREE T4 REFLEX: TSH: 1.72 u[IU]/mL (ref 0.450–5.330)

## 2022-07-28 LAB — VITAMIN D 25 TOTAL: VITAMIN D: 55 ng/mL (ref 30–100)

## 2022-07-30 ENCOUNTER — Encounter (RURAL_HEALTH_CENTER): Payer: Self-pay | Admitting: Family Medicine

## 2022-07-30 ENCOUNTER — Other Ambulatory Visit (RURAL_HEALTH_CENTER): Payer: Self-pay | Admitting: Family Medicine

## 2022-07-30 DIAGNOSIS — R635 Abnormal weight gain: Secondary | ICD-10-CM

## 2022-07-30 LAB — THYROXINE, FREE (FREE T4): THYROXINE (T4), FREE: 0.74 ng/dL (ref 0.58–1.64)

## 2022-08-10 ENCOUNTER — Other Ambulatory Visit (RURAL_HEALTH_CENTER): Payer: Self-pay | Admitting: Family Medicine

## 2022-08-10 DIAGNOSIS — M5410 Radiculopathy, site unspecified: Secondary | ICD-10-CM

## 2022-08-20 ENCOUNTER — Other Ambulatory Visit (RURAL_HEALTH_CENTER): Payer: Self-pay | Admitting: Family Medicine

## 2022-08-20 DIAGNOSIS — M5410 Radiculopathy, site unspecified: Secondary | ICD-10-CM

## 2022-09-05 ENCOUNTER — Encounter (RURAL_HEALTH_CENTER): Payer: Self-pay | Admitting: Family Medicine

## 2022-09-05 DIAGNOSIS — N809 Endometriosis, unspecified: Secondary | ICD-10-CM | POA: Insufficient documentation

## 2022-09-19 ENCOUNTER — Other Ambulatory Visit (RURAL_HEALTH_CENTER): Payer: Self-pay | Admitting: Family Medicine

## 2022-09-19 DIAGNOSIS — K219 Gastro-esophageal reflux disease without esophagitis: Secondary | ICD-10-CM

## 2022-10-04 ENCOUNTER — Other Ambulatory Visit (RURAL_HEALTH_CENTER): Payer: Self-pay | Admitting: Family Medicine

## 2022-10-04 DIAGNOSIS — I10 Essential (primary) hypertension: Secondary | ICD-10-CM

## 2022-10-22 ENCOUNTER — Other Ambulatory Visit (RURAL_HEALTH_CENTER): Payer: Self-pay | Admitting: Family Medicine

## 2022-10-22 DIAGNOSIS — M5410 Radiculopathy, site unspecified: Secondary | ICD-10-CM

## 2022-10-22 DIAGNOSIS — I10 Essential (primary) hypertension: Secondary | ICD-10-CM

## 2022-11-03 NOTE — Nursing Note (Signed)
Patient present today for fasting labs. Labs collected via venipuncture and labs collected per orders.

## 2022-11-19 ENCOUNTER — Ambulatory Visit (RURAL_HEALTH_CENTER): Payer: Self-pay | Admitting: Family Medicine

## 2022-11-23 ENCOUNTER — Ambulatory Visit: Payer: 59 | Attending: Family Medicine | Admitting: Family Medicine

## 2022-11-23 ENCOUNTER — Other Ambulatory Visit: Payer: Self-pay

## 2022-11-23 ENCOUNTER — Ambulatory Visit (RURAL_HEALTH_CENTER): Payer: 59 | Attending: Family Medicine

## 2022-11-23 DIAGNOSIS — N809 Endometriosis, unspecified: Secondary | ICD-10-CM | POA: Insufficient documentation

## 2022-11-23 DIAGNOSIS — G43909 Migraine, unspecified, not intractable, without status migrainosus: Secondary | ICD-10-CM | POA: Insufficient documentation

## 2022-11-23 DIAGNOSIS — I10 Essential (primary) hypertension: Secondary | ICD-10-CM | POA: Insufficient documentation

## 2022-11-23 DIAGNOSIS — E538 Deficiency of other specified B group vitamins: Secondary | ICD-10-CM | POA: Insufficient documentation

## 2022-11-23 DIAGNOSIS — E559 Vitamin D deficiency, unspecified: Secondary | ICD-10-CM | POA: Insufficient documentation

## 2022-11-23 DIAGNOSIS — E876 Hypokalemia: Secondary | ICD-10-CM | POA: Insufficient documentation

## 2022-11-23 LAB — CBC WITH DIFF
BASOPHIL #: 0 10*3/uL (ref 0.00–0.10)
BASOPHIL %: 0 % (ref 0–1)
EOSINOPHIL #: 0.2 10*3/uL (ref 0.00–0.50)
EOSINOPHIL %: 3 %
HCT: 35.9 % (ref 31.2–41.9)
HGB: 12.1 g/dL (ref 10.9–14.3)
LYMPHOCYTE #: 3.7 10*3/uL — ABNORMAL HIGH (ref 1.00–3.00)
LYMPHOCYTE %: 51 % — ABNORMAL HIGH (ref 16–44)
MCH: 31.2 pg (ref 24.7–32.8)
MCHC: 33.7 g/dL (ref 32.3–35.6)
MCV: 92.8 fL (ref 75.5–95.3)
MONOCYTE #: 0.5 10*3/uL (ref 0.30–1.00)
MONOCYTE %: 7 % (ref 5–13)
MPV: 8.3 fL (ref 7.9–10.8)
NEUTROPHIL #: 2.8 10*3/uL (ref 1.85–7.80)
NEUTROPHIL %: 39 % — ABNORMAL LOW (ref 43–77)
PLATELETS: 354 10*3/uL (ref 140–440)
RBC: 3.87 10*6/uL (ref 3.63–4.92)
RDW: 13.5 % (ref 12.3–17.7)
WBC: 7.2 10*3/uL (ref 3.8–11.8)

## 2022-11-23 LAB — COMPREHENSIVE METABOLIC PNL, FASTING
ALBUMIN/GLOBULIN RATIO: 1.8 — ABNORMAL HIGH (ref 0.8–1.4)
ALBUMIN: 4.2 g/dL (ref 3.5–5.7)
ALKALINE PHOSPHATASE: 114 U/L — ABNORMAL HIGH (ref 34–104)
ALT (SGPT): 14 U/L (ref 7–52)
ANION GAP: 7 mmol/L (ref 4–13)
AST (SGOT): 17 U/L (ref 13–39)
BILIRUBIN TOTAL: 0.3 mg/dL (ref 0.3–1.2)
BUN/CREA RATIO: 16 (ref 6–22)
BUN: 13 mg/dL (ref 7–25)
CALCIUM, CORRECTED: 9.3 mg/dL (ref 8.9–10.8)
CALCIUM: 9.5 mg/dL (ref 8.6–10.3)
CHLORIDE: 102 mmol/L (ref 98–107)
CO2 TOTAL: 27 mmol/L (ref 21–31)
CREATININE: 0.79 mg/dL (ref 0.60–1.30)
ESTIMATED GFR: 87 mL/min/{1.73_m2} (ref >59)
GLOBULIN: 2.4 — ABNORMAL LOW (ref 2.9–5.4)
GLUCOSE: 91 mg/dL (ref 74–109)
OSMOLALITY, CALCULATED: 272 mOsm/kg (ref 270–290)
POTASSIUM: 3.8 mmol/L (ref 3.5–5.1)
PROTEIN TOTAL: 6.6 g/dL (ref 6.4–8.9)
SODIUM: 136 mmol/L (ref 136–145)

## 2022-11-23 LAB — LIPID PANEL
CHOL/HDL RATIO: 4.4
CHOLESTEROL: 190 mg/dL (ref <200)
HDL CHOL: 43 mg/dL (ref >=40)
LDL CALC: 128 mg/dL — ABNORMAL HIGH (ref 0–100)
TRIGLYCERIDES: 94 mg/dL (ref <=150)
VLDL CALC: 19 mg/dL (ref 0–50)

## 2022-11-23 LAB — THYROID STIMULATING HORMONE (SENSITIVE TSH): TSH: 2.396 u[IU]/mL (ref 0.450–5.330)

## 2022-11-23 NOTE — Nursing Note (Signed)
Patient present today for fasting labs. Labs collected via venipuncture and labs collected per orders.

## 2022-11-25 ENCOUNTER — Encounter (RURAL_HEALTH_CENTER): Payer: Self-pay | Admitting: Family Medicine

## 2022-11-25 ENCOUNTER — Other Ambulatory Visit: Payer: Self-pay

## 2022-11-25 ENCOUNTER — Ambulatory Visit (RURAL_HEALTH_CENTER): Payer: 59 | Attending: Family Medicine | Admitting: Family Medicine

## 2022-11-25 VITALS — BP 121/64 | HR 94 | Temp 98.3°F | Resp 20 | Wt 171.1 lb

## 2022-11-25 DIAGNOSIS — Z1211 Encounter for screening for malignant neoplasm of colon: Secondary | ICD-10-CM | POA: Insufficient documentation

## 2022-11-25 DIAGNOSIS — T7840XA Allergy, unspecified, initial encounter: Secondary | ICD-10-CM | POA: Insufficient documentation

## 2022-11-25 DIAGNOSIS — R3915 Urgency of urination: Secondary | ICD-10-CM | POA: Insufficient documentation

## 2022-11-25 DIAGNOSIS — R350 Frequency of micturition: Secondary | ICD-10-CM | POA: Insufficient documentation

## 2022-11-25 DIAGNOSIS — M542 Cervicalgia: Secondary | ICD-10-CM | POA: Insufficient documentation

## 2022-11-25 DIAGNOSIS — G8929 Other chronic pain: Secondary | ICD-10-CM | POA: Insufficient documentation

## 2022-11-25 DIAGNOSIS — E876 Hypokalemia: Secondary | ICD-10-CM | POA: Insufficient documentation

## 2022-11-25 DIAGNOSIS — M5416 Radiculopathy, lumbar region: Secondary | ICD-10-CM | POA: Insufficient documentation

## 2022-11-25 DIAGNOSIS — Z79899 Other long term (current) drug therapy: Secondary | ICD-10-CM | POA: Insufficient documentation

## 2022-11-25 DIAGNOSIS — Z634 Disappearance and death of family member: Secondary | ICD-10-CM | POA: Insufficient documentation

## 2022-11-25 DIAGNOSIS — M5410 Radiculopathy, site unspecified: Secondary | ICD-10-CM | POA: Insufficient documentation

## 2022-11-25 DIAGNOSIS — D1739 Benign lipomatous neoplasm of skin and subcutaneous tissue of other sites: Secondary | ICD-10-CM | POA: Insufficient documentation

## 2022-11-25 DIAGNOSIS — G43909 Migraine, unspecified, not intractable, without status migrainosus: Secondary | ICD-10-CM | POA: Insufficient documentation

## 2022-11-25 LAB — POCT URINE DIPSTICK
BILIRUBIN: NEGATIVE
GLUCOSE: NEGATIVE
KETONE: NEGATIVE
LEUKOCYTES: NEGATIVE
NITRITE: NEGATIVE
PH: 5
PROTEIN: NEGATIVE
SPECIFIC GRAVITY: 1.015
UROBILINOGEN: 0.2

## 2022-11-25 MED ORDER — CYCLOBENZAPRINE 10 MG TABLET
10.0000 mg | ORAL_TABLET | Freq: Every evening | ORAL | 1 refills | Status: DC | PRN
Start: 2022-11-25 — End: 2023-01-01

## 2022-11-25 MED ORDER — VALACYCLOVIR 1 GRAM TABLET
1000.0000 mg | ORAL_TABLET | Freq: Two times a day (BID) | ORAL | 3 refills | Status: AC
Start: 2022-11-25 — End: ?

## 2022-11-25 MED ORDER — IBUPROFEN 800 MG TABLET
ORAL_TABLET | ORAL | 1 refills | Status: DC
Start: 2022-11-25 — End: 2023-01-22

## 2022-11-25 MED ORDER — SULFAMETHOXAZOLE 800 MG-TRIMETHOPRIM 160 MG TABLET
1.00 | ORAL_TABLET | Freq: Two times a day (BID) | ORAL | 0 refills | Status: AC
Start: 2022-11-25 — End: ?

## 2022-11-25 MED ORDER — EPINEPHRINE 0.3 MG/0.3 ML INJECTION, AUTO-INJECTOR
0.3000 mg | AUTO-INJECTOR | Freq: Once | INTRAMUSCULAR | 0 refills | Status: DC | PRN
Start: 2022-11-25 — End: 2022-12-09

## 2022-11-25 MED ORDER — ACYCLOVIR 5 % TOPICAL OINTMENT
TOPICAL_OINTMENT | Freq: Every day | CUTANEOUS | 3 refills | Status: AC
Start: 2022-11-25 — End: ?

## 2022-11-25 MED ORDER — POTASSIUM CHLORIDE ER 20 MEQ TABLET,EXTENDED RELEASE(PART/CRYST)
20.0000 meq | ORAL_TABLET | Freq: Three times a day (TID) | ORAL | 1 refills | Status: DC
Start: 2022-11-25 — End: 2023-06-04

## 2022-11-25 MED ORDER — ONDANSETRON HCL 4 MG TABLET: 4.0000 mg | ORAL_TABLET | Freq: Three times a day (TID) | ORAL | 6 refills | Status: DC | PRN

## 2022-11-25 NOTE — Progress Notes (Signed)
FAMILY MEDICINE, [facility] CLINIC  [address]  [place] [address]  Operated by [facility]     Name: Cassandra Elliott MRN:  [id number]   Date: 11/25/2022 Age: 57 y.o.          Provider: Arnell Sieving, DO    Reason for visit: Follow Up 4 Months (Burning with urination over the weekend. Now has frequency and urgency. Getting up frequently at night.//Go over labs//Check ankle ( right) lipoma)      History of Present Illness:  Cassandra Elliott is a 57 y.o. female presents today for a 3 month/6 month follow up appointment.     She complains of pain with urination that started over the past weekend. She now has associated frequency and urgency.     Essential hypertension:  Has been doing well - she reports hers is 120/70  Valsartan 160 mg daily  Amlodipine 2.5 mg daily  Triamterene/HCTZ 37.5/25 mg daily     GERD:  Controlled.  Rabeprazole 20 mg daily     Lumbar radiculopathy:  Unchanged  She reports two current disc herniations at T10-T11 and L1-L2  History of L5-S1 herniation  Status post L4-L5 fusion  Lyrica 50 mg 3 times daily no drowsiness or problems.   Massage Order previously provided - Annabelle Harman Deskins. She reports that the massages help more than anything.      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   Zofran as needed for nausea  Naratriptan 2.5 mg as needed for severe migraine symptoms     B12 1000 mcg every 3 days     Grief, bereavement: started after the passing of both of her parents in a short time frame  Continues to follow with grief counseling which she feels is beneficial.     Insomnia:  Trazodone only PRN    Historical Data    Past Medical History:  Past Medical History:    Diagnosis Date    B12 deficiency     Biceps tendinitis     Bulging lumbar disc     L3-L4 & T10-T11    Chronic radicular low back pain     Costochondritis     Endometriosis     Esophageal reflux     Floaters     left eye retina issue    Grief reaction with prolonged bereavement     Herniated disc, cervical     Hypertension     Hypokalemia     Migraines     Miscarriage 1986    Rash     Renal calculi     Somatic dysfunction of rib cage region     Umbilical hernia     @ birth    Upper respiratory infection     2024    Use of leuprolide acetate (Lupron)     2013 2014    Vitamin D deficiency          Allergies:  Allergies   Allergen Reactions    Venom-Honey Bee Anaphylaxis     Medications:  Current Outpatient Medications   Medication Sig    acyclovir (ZOVIRAX) 5 % Ointment Apply topically Five  times a day    amLODIPine (NORVASC) 2.5 mg Oral Tablet TAKE 1 TABLET (2.5 MG TOTAL) BY MOUTH EVERY DAY    aspirin 81 mg Oral Tablet, Chewable Chew 1 Tablet (81 mg total) Once a day    Butalbital-Acetaminophen-Caff 50-300-40 mg Oral Capsule Take 1 Capsule by mouth Every 8 hours as needed    cetirizine (ZYRTEC) 10 mg Oral Tablet Take 1 Tablet (10 mg total) by mouth Once a day    Cholecalciferol, Vitamin D3, 25 mcg (1,000 unit) Oral Capsule Take 5 Capsules (5,000 Units total) by mouth Once a day    cranberry conc/ascorbic acid (SUPER CRANBERRY ORAL) Take 8,400 mg by mouth Once a day Uses OTC 8400    cyanocobalamin (VITAMIN B 12) 1,000 mcg Oral Tablet Take 1 Tablet (1,000 mcg total) by mouth Every 3 days    cyclobenzaprine (FLEXERIL) 10 mg Oral Tablet Take 1 Tablet (10 mg total) by mouth Every night as needed    EPINEPHrine 0.3 mg/0.3 mL Injection Auto-Injector Inject 0.3 mL (0.3 mg total) into the muscle Once, as needed for up to 1 dose    fluticasone propionate (FLONASE) 50 mcg/actuation Nasal Spray, Suspension Administer 2 Sprays into each nostril Once a day    Ibuprofen (MOTRIN) 800 mg Oral Tablet TAKE 1 TABLET BY MOUTH EVERY 8  HOURS AS NEEDED FOR PAIN    Magnesium Gluconate 27 mg magnesium (500 mg) Oral Tablet Take 500 mg by mouth Once a day    Naratriptan 2.5 mg Oral Tablet TAKE 1 TABLET (2.5 MG TOTAL) BY MOUTH ONCE PER DAY AS NEEDED FOR MIGRAINE FOR UP TO 1 DOSE    ondansetron (ZOFRAN) 4 mg Oral Tablet Take 1 Tablet (4 mg total) by mouth Every 8 hours as needed    potassium chloride (KLOR-CON M20) 20 mEq Oral Tab Sust.Rel. Particle/Crystal Take 1 Tablet (20 mEq total) by mouth Three times a day    pregabalin (LYRICA) 50 mg Oral Capsule TAKE 1 CAPSULE (50 MG TOTAL) BY MOUTH THREE TIMES A DAY    RABEprazole (ACIPHEX) 20 mg Oral Tablet, Delayed Release (E.C.) TAKE 1 TABLET BY MOUTH ONCE A DAY    Saccharomyces boulardii (FLORASTOR) 250 mg Oral Capsule Take 1 Capsule (250 mg total) by mouth Once a day    triamterene-hydroCHLOROthiazide (MAXZIDE-25) 37.5-25 mg Oral Tablet TAKE 1 TABLET BY MOUTH EVERY DAY IN THE MORNING    trimethoprim-sulfamethoxazole (BACTRIM DS) 160-800mg  per tablet Take 1 Tablet (160 mg total) by mouth Twice daily    valACYclovir (VALTREX) 1 gram Oral Tablet Take 1 Tablet (1 g total) by mouth Twice daily    valsartan (DIOVAN) 160 mg Oral Tablet TAKE 1 TABLET (160 MG TOTAL) BY MOUTH ONCE A DAY     Social History:  Social History     Socioeconomic History    Marital status: Divorced   Tobacco Use    Smoking status: Never    Smokeless tobacco: Never   Vaping Use    Vaping status: Never Used   Substance and Sexual Activity    Alcohol use: Never    Drug use: Never           Review of Systems:  Other than ROS in the HPI, all other systems were negative.    Physical Exam:  Vital Signs:  Vitals:    11/25/22 1613   BP: 121/64   Pulse: 94   Resp: 20   Temp: 36.8 C (98.3 F)   SpO2: 97%   Weight: 77.6 kg (  171 lb 2 oz)     Physical Exam  Vitals reviewed.   Constitutional:       General: She is not in acute distress.     Appearance: Normal appearance.   HENT:      Mouth/Throat:      Mouth: Mucous membranes are moist.   Cardiovascular:       Rate and Rhythm: Normal rate.      Heart sounds: No murmur heard.  Pulmonary:      Breath sounds: Normal breath sounds.   Abdominal:      General: Bowel sounds are normal.      Palpations: Abdomen is soft.      Tenderness: There is no abdominal tenderness. There is no guarding.   Musculoskeletal:         General: No swelling or deformity.      Comments: Fluctuant, fluid filled cystic structure on right lateral ankle   Skin:     General: Skin is warm and dry.   Neurological:      General: No focal deficit present.      Mental Status: She is alert.             Urine Dip Results:   Time collected: 1625  Glucose (Ref Range: Negative mg/dL): Negative  Bilirubin (Ref Range: Negative mg/dL): Negative  Ketones (Ref Range: Negative mg/dL): Negative  Urine Specific Gravity (Ref Range: 1.005 - 1.030): 1.015  Blood (urine) (Ref Range: Negative mg/dL): (!) Moderate Non Hemolyzed  pH (Ref Range: 5.0 - 8.0): 5.5  Protein (Ref Range: Negative mg/dL): Negative  Urobilinogen (Ref Range: Normal): 0.2mg /dL (Normal)  Nitrite (Ref Range: Negative): Negative  Leukocytes (Ref Range: Negative WBC's/uL): Negative    Blood  Results:         Rapid Strep Results:         Rapid Flu            Previously completed labs, imaging studies and medical records were reviewed as part of this office visit.     Assessment/Plan:          ICD-10-CM    1. Frequency of urination  R35.0 Empiric antibiotics started based on symptoms. Follow up if symptoms fail to improve.    POCT Urine Dipstick     CANCELED: URINE CULTURE      2. Urinary urgency  R39.15 POCT Urine Dipstick     CANCELED: URINE CULTURE      3. Migraines  G43.909 ondansetron (ZOFRAN) 4 mg Oral Tablet      4. Back pain with radiculopathy  M54.10 cyclobenzaprine (FLEXERIL) 10 mg Oral Tablet     Ibuprofen (MOTRIN) 800 mg Oral Tablet      5. Allergies  T78.40XA EPINEPHrine 0.3 mg/0.3 mL Injection Auto-Injector      6. Hypokalemia  E87.6 Stable.    potassium chloride (KLOR-CON M20) 20 mEq Oral Tab  Sust.Rel. Particle/Crystal      7. Colon cancer screening  Z12.11 Agreeable to Cologuard rather than colonoscopy.    Cologuard colon cancer screening      8. Chronic radicular low back pain  M54.16 AMB CONSULT/REFERRAL MASSAGE THERAPY-EXT    G89.29       9. Neck pain  M54.2 AMB CONSULT/REFERRAL MASSAGE THERAPY-EXT            Return in about 4 months (around 03/28/2023) for In Person Visit - CDM.    Tanija Germani, DO     Portions of this note may be dictated using  voice recognition software or a dictation service. Variances in spelling and vocabulary are possible and unintentional. Not all errors are caught/corrected. Please notify the Thereasa Parkin if any discrepancies are noted or if the meaning of any statement is not clear.

## 2022-11-27 ENCOUNTER — Encounter (RURAL_HEALTH_CENTER): Payer: Self-pay | Admitting: Family Medicine

## 2022-12-08 ENCOUNTER — Other Ambulatory Visit: Payer: Self-pay

## 2022-12-09 ENCOUNTER — Other Ambulatory Visit (RURAL_HEALTH_CENTER): Payer: Self-pay | Admitting: Family Medicine

## 2022-12-09 DIAGNOSIS — T7840XA Allergy, unspecified, initial encounter: Secondary | ICD-10-CM

## 2022-12-15 ENCOUNTER — Other Ambulatory Visit (RURAL_HEALTH_CENTER): Payer: Self-pay | Admitting: Family Medicine

## 2022-12-15 DIAGNOSIS — G43909 Migraine, unspecified, not intractable, without status migrainosus: Secondary | ICD-10-CM

## 2022-12-18 LAB — COLOGUARD® COLON CANCER SCREEN: COLOGUARD RESULT: NEGATIVE

## 2022-12-27 ENCOUNTER — Other Ambulatory Visit (RURAL_HEALTH_CENTER): Payer: Self-pay | Admitting: Family Medicine

## 2022-12-27 DIAGNOSIS — M5410 Radiculopathy, site unspecified: Secondary | ICD-10-CM

## 2023-01-01 ENCOUNTER — Other Ambulatory Visit (RURAL_HEALTH_CENTER): Payer: Self-pay | Admitting: Family Medicine

## 2023-01-01 DIAGNOSIS — M5410 Radiculopathy, site unspecified: Secondary | ICD-10-CM

## 2023-01-17 ENCOUNTER — Other Ambulatory Visit (RURAL_HEALTH_CENTER): Payer: Self-pay | Admitting: Family Medicine

## 2023-01-17 DIAGNOSIS — I10 Essential (primary) hypertension: Secondary | ICD-10-CM

## 2023-01-22 ENCOUNTER — Other Ambulatory Visit (RURAL_HEALTH_CENTER): Payer: Self-pay | Admitting: Family Medicine

## 2023-01-22 DIAGNOSIS — G4700 Insomnia, unspecified: Secondary | ICD-10-CM

## 2023-01-22 DIAGNOSIS — M5410 Radiculopathy, site unspecified: Secondary | ICD-10-CM

## 2023-02-23 ENCOUNTER — Encounter (RURAL_HEALTH_CENTER): Payer: Self-pay | Admitting: Physician Assistant

## 2023-02-23 ENCOUNTER — Ambulatory Visit (RURAL_HEALTH_CENTER): Payer: 59 | Attending: Physician Assistant | Admitting: Physician Assistant

## 2023-02-23 ENCOUNTER — Ambulatory Visit (RURAL_HEALTH_CENTER): Payer: Self-pay | Admitting: Family Medicine

## 2023-02-23 ENCOUNTER — Other Ambulatory Visit: Payer: Self-pay

## 2023-02-23 VITALS — BP 104/73 | HR 83 | Temp 96.0°F | Resp 16

## 2023-02-23 DIAGNOSIS — M5116 Intervertebral disc disorders with radiculopathy, lumbar region: Secondary | ICD-10-CM | POA: Insufficient documentation

## 2023-02-23 DIAGNOSIS — M67479 Ganglion, unspecified ankle and foot: Secondary | ICD-10-CM

## 2023-02-23 DIAGNOSIS — Z981 Arthrodesis status: Secondary | ICD-10-CM | POA: Insufficient documentation

## 2023-02-23 DIAGNOSIS — M25551 Pain in right hip: Secondary | ICD-10-CM | POA: Insufficient documentation

## 2023-02-23 DIAGNOSIS — M67471 Ganglion, right ankle and foot: Secondary | ICD-10-CM | POA: Insufficient documentation

## 2023-02-23 DIAGNOSIS — Z79899 Other long term (current) drug therapy: Secondary | ICD-10-CM | POA: Insufficient documentation

## 2023-02-23 DIAGNOSIS — M5410 Radiculopathy, site unspecified: Secondary | ICD-10-CM

## 2023-02-23 MED ORDER — PREGABALIN 50 MG CAPSULE
ORAL_CAPSULE | ORAL | 2 refills | Status: DC
Start: 2023-02-23 — End: 2023-06-04

## 2023-02-23 NOTE — Progress Notes (Signed)
FAMILY MEDICINE, [facility] CLINIC  [address] [place]  [place] [address]  Operated by [facility]  Progress Note    Name: Cassandra Elliott MRN:  [id number]   Date: 02/23/2023 DOB:  December 10, 1965 (57 y.o.)           Chief complaint: Hip Pain (Right hip ,thigh ,and ankle . Has a cyst on right ankle  that Al[facility] has seen )    [name] presents today with a couple of concerns.       Right ankle cyst:    02/23/23:  Areas has been present for years. Getting worse. Last discussed this with Dr. Kathrin Greathouse in July 2024. After exam and further discussion, she agrees to specialty referral and prefers to do some research and  call back with name for this referral.       Right Hip pain:    02/23/23:  Right hip, inner thigh. Over a month. Hurts. Cannot sleep. Feels heavy/pressure. Uses topical CBD. After clearing trees this weekend, left inner thigh was hurting. Feels like this is more of a muscular issue and this has already improved. Has considered the possibility that her right leg symptoms are related to her known lumbar radiculopathy. See below.        Lumbar radiculopathy:    Has dealt with this for years. Has two current disc herniations at T10-T11 and L1-L2. She reports History of L5-S1 herniation. Status post L4-L5 fusion. Might be fused from L3 down. See previous related entries in EHR. Going to [place] for DTM. Worse HS. Says that massage therapy helps with both legs for about 5 or 6 days. Taking Ibuprofen and Flexeril. Has a trainer at the gym. Has been on Lyrica 50 mg TID and tolerates this well. However, sometimes she will miss her midday dose. Was unable to tolerate Gabapentin.       Objective:  BP 104/73   Pulse 83   Temp (!) 35.6 C (96 F) (Skin)   Resp 16   SpO2 96%     Physical Exam  Vitals and nursing note reviewed.   Constitutional:       General: She is not in acute distress.     Appearance: Normal appearance.   Cardiovascular:      Rate and Rhythm: Normal rate and regular rhythm.       Heart sounds: Normal heart sounds.   Pulmonary:      Effort: Pulmonary effort is normal.      Breath sounds: Normal breath sounds.   Abdominal:      General: Bowel sounds are normal.      Palpations: Abdomen is soft.      Tenderness: There is no abdominal tenderness. There is no guarding.   Musculoskeletal:      Cervical back: Neck supple.      Lumbar back: Tenderness present. Decreased range of motion.      Right ankle: Swelling (Fluctuant, fluid filled cystic structure noted to lateral aspect of right ankle.) present. Tenderness present. Decreased range of motion. Normal pulse.      Comments:   Fair right hip ROM. No palpable areas of right hip/leg tenderness.   Positive SLR. Some weakness noted on right side with dorsi and plantar flexion against resistance.   PPP.   Sensation to light touch intact.  No clinical suspicion for acute DVT at this time.     Skin:     General: Skin is warm and dry.   Neurological:  Mental Status: She is alert.          Assessment/Plan:    ICD-10-CM    1. Back pain with radiculopathy  M54.10 pregabalin (LYRICA) 50 mg Oral Capsule      2. Ganglion of ankle  M67.479       3. Right hip pain  M25.551         Medical Decision Making/Counseling:    Right lower extremity symptoms seem most consistent with lumbar radiculopathy. Increase Lyrica. Try Lyrica 50 mg AM,  50 mg midday and she will be more intentional not to miss this mid day dose, and increase to Lyrica 100 mg HS. Symptomatic measures/supportive care as discussed as well as continued Ibuprofen and Flexeril. Discussed repeat lumbar and even hip imaging but decided to wait for now and see what happens with the above med changes. She will continue deep tissue massage therapy as well. Recheck if not improving or worse. Go to ER for any severe symptoms including but not limited to incontinence, saddle anesthesia and so on.     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 She agrees to specialty referral and prefers to do  some research and  call back with name for this referral. Will provide referral accordingly.     Keep routine follow up as scheduled in November. RTC sooner if needed.       Data Reviewed:  Last routine visit with Dr. Kathrin Greathouse.       Orders Placed This Encounter    pregabalin (LYRICA) 50 mg Oral Capsule        Current Outpatient Medications   Medication Sig    acyclovir (ZOVIRAX) 5 % Ointment Apply topically Five times a day    amLODIPine (NORVASC) 2.5 mg Oral Tablet TAKE 1 TABLET (2.5 MG TOTAL) BY MOUTH EVERY DAY    aspirin 81 mg Oral Tablet, Chewable Chew 1 Tablet (81 mg total) Once a day    Butalbital-Acetaminophen-Caff 50-300-40 mg Oral Capsule Take 1 Capsule by mouth Every 8 hours as needed    cetirizine (ZYRTEC) 10 mg Oral Tablet Take 1 Tablet (10 mg total) by mouth Once a day    Cholecalciferol, Vitamin D3, 25 mcg (1,000 unit) Oral Capsule Take 5 Capsules (5,000 Units total) by mouth Once a day    cranberry conc/ascorbic acid (SUPER CRANBERRY ORAL) Take 8,400 mg by mouth Once a day Uses OTC 8400    cyanocobalamin (VITAMIN B 12) 1,000 mcg Oral Tablet Take 1 Tablet (1,000 mcg total) by mouth Every 3 days    cyclobenzaprine (FLEXERIL) 10 mg Oral Tablet TAKE 1 TABLET (10 MG TOTAL) BY MOUTH EVERY NIGHT AS NEEDED    EPINEPHrine 0.3 mg/0.3 mL Injection Auto-Injector INJECT 0.3 ML (0.3 MG TOTAL) INTO THE MUSCLE ONCE, AS NEEDED FOR UP TO 1 DOSE    fluticasone propionate (FLONASE) 50 mcg/actuation Nasal Spray, Suspension Administer 2 Sprays into each nostril Once a day    Ibuprofen (MOTRIN) 800 mg Oral Tablet TAKE 1 TABLET BY MOUTH EVERY 8 HOURS AS NEEDED FOR PAIN    Magnesium Gluconate 27 mg magnesium (500 mg) Oral Tablet Take 500 mg by mouth Once a day    Naratriptan 2.5 mg Oral Tablet TAKE 1 TABLET (2.5 MG TOTAL) BY MOUTH ONCE PER DAY AS NEEDED FOR MIGRAINE FOR UP TO 1 DOSE    ondansetron (ZOFRAN) 4 mg Oral Tablet Take 1 Tablet (4 mg total) by mouth Every 8 hours as needed    potassium chloride (KLOR-CON  M20) 20 mEq  Oral Tab Sust.Rel. Particle/Crystal Take 1 Tablet (20 mEq total) by mouth Three times a day    pregabalin (LYRICA) 50 mg Oral Capsule TAKE 1 CAPSULE (50 MG TOTAL) BY MOUTH THREE TIMES A DAY    RABEprazole (ACIPHEX) 20 mg Oral Tablet, Delayed Release (E.C.) TAKE 1 TABLET BY MOUTH ONCE A DAY    Saccharomyces boulardii (FLORASTOR) 250 mg Oral Capsule Take 1 Capsule (250 mg total) by mouth Once a day    triamterene-hydroCHLOROthiazide (MAXZIDE-25) 37.5-25 mg Oral Tablet TAKE 1 TABLET BY MOUTH EVERY DAY IN THE MORNING    trimethoprim-sulfamethoxazole (BACTRIM DS) 160-800mg  per tablet Take 1 Tablet (160 mg total) by mouth Twice daily    valACYclovir (VALTREX) 1 gram Oral Tablet Take 1 Tablet (1 g total) by mouth Twice daily    valsartan (DIOVAN) 160 mg Oral Tablet TAKE 1 TABLET (160 MG TOTAL) BY MOUTH ONCE A DAY        Return if symptoms worsen or fail to improve.    Ashby Dawes, PA-C

## 2023-02-24 ENCOUNTER — Ambulatory Visit (RURAL_HEALTH_CENTER): Payer: Self-pay | Admitting: Physician Assistant

## 2023-03-17 ENCOUNTER — Other Ambulatory Visit (RURAL_HEALTH_CENTER): Payer: Self-pay | Admitting: Family Medicine

## 2023-03-17 DIAGNOSIS — K219 Gastro-esophageal reflux disease without esophagitis: Secondary | ICD-10-CM

## 2023-04-07 ENCOUNTER — Other Ambulatory Visit (RURAL_HEALTH_CENTER): Payer: Self-pay | Admitting: Family Medicine

## 2023-04-07 DIAGNOSIS — I10 Essential (primary) hypertension: Secondary | ICD-10-CM

## 2023-04-12 ENCOUNTER — Ambulatory Visit (RURAL_HEALTH_CENTER): Payer: 59 | Attending: Family Medicine

## 2023-04-12 ENCOUNTER — Other Ambulatory Visit: Payer: Self-pay

## 2023-04-12 ENCOUNTER — Ambulatory Visit: Payer: 59 | Attending: Family Medicine | Admitting: Family Medicine

## 2023-04-12 DIAGNOSIS — E876 Hypokalemia: Secondary | ICD-10-CM | POA: Insufficient documentation

## 2023-04-12 DIAGNOSIS — E559 Vitamin D deficiency, unspecified: Secondary | ICD-10-CM | POA: Insufficient documentation

## 2023-04-12 DIAGNOSIS — E538 Deficiency of other specified B group vitamins: Secondary | ICD-10-CM | POA: Insufficient documentation

## 2023-04-12 DIAGNOSIS — R739 Hyperglycemia, unspecified: Secondary | ICD-10-CM | POA: Insufficient documentation

## 2023-04-12 DIAGNOSIS — I10 Essential (primary) hypertension: Secondary | ICD-10-CM | POA: Insufficient documentation

## 2023-04-12 LAB — COMPREHENSIVE METABOLIC PNL, FASTING
ALBUMIN/GLOBULIN RATIO: 1.9 — ABNORMAL HIGH (ref 0.8–1.4)
ALBUMIN: 4.1 g/dL (ref 3.5–5.7)
ALKALINE PHOSPHATASE: 95 U/L (ref 34–104)
ALT (SGPT): 14 U/L (ref 7–52)
ANION GAP: 7 mmol/L (ref 4–13)
AST (SGOT): 17 U/L (ref 13–39)
BILIRUBIN TOTAL: 0.3 mg/dL (ref 0.3–1.0)
BUN/CREA RATIO: 21 (ref 6–22)
BUN: 16 mg/dL (ref 7–25)
CALCIUM, CORRECTED: 9.1 mg/dL (ref 8.9–10.8)
CALCIUM: 9.2 mg/dL (ref 8.6–10.3)
CHLORIDE: 104 mmol/L (ref 98–107)
CO2 TOTAL: 28 mmol/L (ref 21–31)
CREATININE: 0.76 mg/dL (ref 0.60–1.30)
ESTIMATED GFR: 91 mL/min/{1.73_m2} (ref >59)
GLOBULIN: 2.2 (ref 2.0–3.5)
GLUCOSE: 88 mg/dL (ref 74–109)
OSMOLALITY, CALCULATED: 278 mosm/kg (ref 270–290)
POTASSIUM: 4.1 mmol/L (ref 3.5–5.1)
PROTEIN TOTAL: 6.3 g/dL — ABNORMAL LOW (ref 6.4–8.9)
SODIUM: 139 mmol/L (ref 136–145)

## 2023-04-12 LAB — CBC WITH DIFF
BASOPHIL #: 0 10*3/uL (ref 0.00–0.10)
BASOPHIL %: 1 % (ref 0–1)
EOSINOPHIL #: 0.3 10*3/uL (ref 0.00–0.50)
EOSINOPHIL %: 5 % (ref 1–7)
HCT: 35.2 % (ref 31.2–41.9)
HGB: 11.9 g/dL (ref 10.9–14.3)
LYMPHOCYTE #: 3.2 10*3/uL — ABNORMAL HIGH (ref 1.00–3.00)
LYMPHOCYTE %: 54 % — ABNORMAL HIGH (ref 16–44)
MCH: 31.5 pg (ref 24.7–32.8)
MCHC: 33.7 g/dL (ref 32.3–35.6)
MCV: 93.3 fL (ref 75.5–95.3)
MONOCYTE #: 0.4 10*3/uL (ref 0.30–1.00)
MONOCYTE %: 6 % (ref 5–13)
MPV: 8.7 fL (ref 7.9–10.8)
NEUTROPHIL #: 2 10*3/uL (ref 1.85–7.80)
NEUTROPHIL %: 34 % — ABNORMAL LOW (ref 43–77)
PLATELETS: 318 10*3/uL (ref 140–440)
RBC: 3.77 10*6/uL (ref 3.63–4.92)
RDW: 13.2 % (ref 12.3–17.7)
WBC: 5.9 10*3/uL (ref 3.8–11.8)

## 2023-04-12 LAB — LIPID PANEL
CHOL/HDL RATIO: 3.5
CHOLESTEROL: 170 mg/dL (ref <200)
HDL CHOL: 48 mg/dL (ref >=40)
LDL CALC: 102 mg/dL — ABNORMAL HIGH (ref 0–100)
TRIGLYCERIDES: 100 mg/dL (ref <=150)
VLDL CALC: 20 mg/dL (ref 0–50)

## 2023-04-12 LAB — THYROID STIMULATING HORMONE (SENSITIVE TSH): TSH: 1.782 u[IU]/mL (ref 0.450–5.330)

## 2023-04-12 NOTE — Nursing Note (Signed)
Patient present today for fasting labs. Labs collected via venipuncture and labs collected per orders.

## 2023-04-13 ENCOUNTER — Ambulatory Visit (RURAL_HEALTH_CENTER): Payer: Self-pay | Admitting: Family Medicine

## 2023-04-18 ENCOUNTER — Other Ambulatory Visit (RURAL_HEALTH_CENTER): Payer: Self-pay | Admitting: Family Medicine

## 2023-04-18 DIAGNOSIS — I10 Essential (primary) hypertension: Secondary | ICD-10-CM

## 2023-05-25 ENCOUNTER — Other Ambulatory Visit: Payer: Self-pay

## 2023-06-04 ENCOUNTER — Encounter (RURAL_HEALTH_CENTER): Payer: Self-pay | Admitting: Family Medicine

## 2023-06-04 ENCOUNTER — Other Ambulatory Visit: Payer: Self-pay

## 2023-06-04 ENCOUNTER — Ambulatory Visit (RURAL_HEALTH_CENTER): Payer: 59 | Attending: Family Medicine | Admitting: Family Medicine

## 2023-06-04 DIAGNOSIS — M67471 Ganglion, right ankle and foot: Secondary | ICD-10-CM | POA: Insufficient documentation

## 2023-06-04 DIAGNOSIS — E538 Deficiency of other specified B group vitamins: Secondary | ICD-10-CM | POA: Insufficient documentation

## 2023-06-04 DIAGNOSIS — K219 Gastro-esophageal reflux disease without esophagitis: Secondary | ICD-10-CM | POA: Insufficient documentation

## 2023-06-04 DIAGNOSIS — M25561 Pain in right knee: Secondary | ICD-10-CM | POA: Insufficient documentation

## 2023-06-04 DIAGNOSIS — T7840XD Allergy, unspecified, subsequent encounter: Secondary | ICD-10-CM | POA: Insufficient documentation

## 2023-06-04 DIAGNOSIS — M5410 Radiculopathy, site unspecified: Secondary | ICD-10-CM | POA: Insufficient documentation

## 2023-06-04 DIAGNOSIS — G43909 Migraine, unspecified, not intractable, without status migrainosus: Secondary | ICD-10-CM | POA: Insufficient documentation

## 2023-06-04 DIAGNOSIS — I10 Essential (primary) hypertension: Secondary | ICD-10-CM | POA: Insufficient documentation

## 2023-06-04 DIAGNOSIS — E876 Hypokalemia: Secondary | ICD-10-CM | POA: Insufficient documentation

## 2023-06-04 DIAGNOSIS — G4700 Insomnia, unspecified: Secondary | ICD-10-CM

## 2023-06-04 DIAGNOSIS — M67479 Ganglion, unspecified ankle and foot: Secondary | ICD-10-CM | POA: Insufficient documentation

## 2023-06-04 MED ORDER — POTASSIUM CHLORIDE ER 20 MEQ TABLET,EXTENDED RELEASE(PART/CRYST)
20.0000 meq | ORAL_TABLET | Freq: Three times a day (TID) | ORAL | 1 refills | Status: DC
Start: 2023-06-04 — End: 2024-01-25

## 2023-06-04 MED ORDER — TRIAMTERENE 37.5 MG-HYDROCHLOROTHIAZIDE 25 MG TABLET
1.0000 | ORAL_TABLET | Freq: Every morning | ORAL | 1 refills | Status: DC
Start: 2023-06-04 — End: 2024-01-25

## 2023-06-04 MED ORDER — VALSARTAN 160 MG TABLET
160.0000 mg | ORAL_TABLET | Freq: Every day | ORAL | 1 refills | Status: DC
Start: 2023-06-04 — End: 2024-01-25

## 2023-06-04 MED ORDER — PREGABALIN 50 MG CAPSULE
ORAL_CAPSULE | ORAL | 2 refills | Status: DC
Start: 2023-06-04 — End: 2023-10-28

## 2023-06-04 NOTE — Nursing Note (Signed)
CDM- no med changes, fell back in December Ruthy @ [facility] ordered her xrays had done at [facility] called to get a copy of reports having right knee and ankle pain. Also was given 7 days of Mobic and knee injection on 05/07/23 rates pain today 3-4/10. No other symptoms besides pain

## 2023-06-04 NOTE — Progress Notes (Signed)
FAMILY MEDICINE, [facility]  [address] [place]  [place] [address]  Operated by [facility]  Telephone Visit    Name:  Noreen Garfinkle MRN: [id number]   Date:  06/04/2023 Age:   58 y.o.     The patient/family initiated a request for telephone service.  Verbal consent for this service was obtained from the patient/family.    Last office visit in this department: 02/23/2023      Reason for call:   Chief Complaint   Patient presents with    Medication Check     CDM- no med changes, fell back in December Ruthy @ [facility] ordered her xrays had done at [facility] called to get a copy of reports having right knee and ankle pain. Also was given 7 days of Mobic and knee injection on 05/07/23 rates pain today 3-4/10. No other symptoms besides pain        Call notes:    Knee pain:   [place] surgery on right previous  Was really swollen  Injected with xylocaine and depomedrol by PA at [facility] - calmed down a lot followed by 7 days of Mobic.   Continues to swell and periodically feels like it will give out/buckle.       Essential hypertension:  Has been doing well - she continues to monitor at work and at home.   Valsartan 160 mg daily  Amlodipine 2.5 mg daily  Triamterene/HCTZ 37.5/25 mg daily     GERD:  Controlled with current medication..  Rabeprazole 20 mg daily     Lumbar radiculopathy:  Unchanged  She reports two current disc herniations at T10-T11 and L1-L2  History of L5-S1 herniation  Status post L4-L5 fusion  Lyrica 50 mg 3 times daily no drowsiness or problems.   Massage Order previously provided - Annabelle Harman Deskins. She reports that the massages help more than anything.      Hypokalemia:  Klor-Con 3 times daily     Seasonal allergies:  Chronic. Stable.   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   Zofran as  needed for nausea  Naratriptan 2.5 mg as needed for severe migraine symptoms     B12 1000 mcg every 3 days     Grief, bereavement: started after the passing of both of her parents in a short time frame  Continues to follow with grief counseling  - helpful  Does not feel that she needs medications.     Insomnia:  Trazodone only PRN    Right ankle cyst:    She requests to see Dr. Nicholes Stairs re: the cyst on her ankle as that is causing significant discomfort and even pain up along the lateral aspect of her leg with prolonged standing.   02/23/23:  Areas has been present for years. Getting worse. Last discussed this with Dr. Kathrin Greathouse in July 2024. After exam and further discussion, she agrees to specialty referral and prefers to do some research and  call back with name for this referral.       ICD-10-CM    1. Ganglion of ankle  M67.479 Will refer to orthopedics per patient request since this is ongoing and causing pain/discomfort with walking.     Referral to External Provider      2. Right knee pain  M25.561  Encouraged her to follow up with orthopedics regarding this as well. May ACE wrap the knee while you are standing throughout the day for work to try and help keep the swelling decreased - Since you do not feel a buckling sensation; would hold off on supportive knee braces as this overtime can actually weaken the knee.       3. Back pain with radiculopathy  M54.10 Chronic. Stable. Continue current medication as previously  prescribed. No adverse effects reported.    pregabalin (LYRICA) 50 mg Oral Capsule      4. Hypertension, unspecified type  I10 Chronic. Stable. Continue current medication.     triamterene-hydroCHLOROthiazide (MAXZIDE-25) 37.5-25 mg Oral Tablet     valsartan (DIOVAN) 160 mg Oral Tablet      5. Hypokalemia  E87.6 potassium chloride (KLOR-CON M20) 20 mEq Oral Tab Sust.Rel. Particle/Crystal      6. Insomnia, unspecified type  G47.00 Chronic. Stable. Trazodone PRN.       7. Migraines  G43.909 Overall stable.  Continue medications PRN. Follow up if migraines change or increase in frequency.       8. B12 deficiency  E53.8       9. Gastroesophageal reflux disease, unspecified whether esophagitis present  K21.9 Controlled on current medication.       10. Allergy, subsequent encounter  T78.40XD Stable. Controlled on current medication.           Total provider time spent with the patient on the phone: 12 minutes.    Ever Gustafson, DO

## 2023-06-10 ENCOUNTER — Telehealth (RURAL_HEALTH_CENTER): Payer: Self-pay | Admitting: Family Medicine

## 2023-06-10 NOTE — Telephone Encounter (Signed)
Is there anyway you could sign Lendon Collar office note so I can complete the referral? Thanks! Jlester

## 2023-06-15 NOTE — Telephone Encounter (Signed)
Done

## 2023-06-29 ENCOUNTER — Telehealth (RURAL_HEALTH_CENTER): Payer: Self-pay | Admitting: Family Medicine

## 2023-06-29 NOTE — Telephone Encounter (Signed)
Jawana Reagor has an appt 08/18/2023 @ 9 with Dr. Nicholes Stairs. I spoke to the patient and is aware of the appt. Jlester  Phone# [phone]

## 2023-07-05 ENCOUNTER — Other Ambulatory Visit (RURAL_HEALTH_CENTER): Payer: Self-pay | Admitting: Physician Assistant

## 2023-07-05 DIAGNOSIS — M5410 Radiculopathy, site unspecified: Secondary | ICD-10-CM

## 2023-07-07 NOTE — Telephone Encounter (Signed)
Pharmacy has a refill on file. Amber will get it ready for patient.

## 2023-07-17 ENCOUNTER — Other Ambulatory Visit (RURAL_HEALTH_CENTER): Payer: Self-pay | Admitting: Family Medicine

## 2023-07-17 DIAGNOSIS — I10 Essential (primary) hypertension: Secondary | ICD-10-CM

## 2023-08-10 ENCOUNTER — Ambulatory Visit (RURAL_HEALTH_CENTER): Payer: Self-pay | Attending: Family Medicine | Admitting: Family Medicine

## 2023-08-10 ENCOUNTER — Other Ambulatory Visit: Payer: Self-pay

## 2023-08-10 ENCOUNTER — Encounter (RURAL_HEALTH_CENTER): Payer: Self-pay | Admitting: Family Medicine

## 2023-08-10 VITALS — BP 120/76 | HR 82 | Temp 98.1°F | Resp 20 | Wt 176.4 lb

## 2023-08-10 DIAGNOSIS — L821 Other seborrheic keratosis: Secondary | ICD-10-CM | POA: Insufficient documentation

## 2023-08-10 DIAGNOSIS — L989 Disorder of the skin and subcutaneous tissue, unspecified: Secondary | ICD-10-CM | POA: Insufficient documentation

## 2023-08-10 NOTE — Progress Notes (Signed)
 FAMILY MEDICINE, [facility]  [address]  [place] [address]  Operated by [facility]    Acute Office Visit      NAME: Cassandra Elliott DOS: 08/10/2023   MRN: [id number] DOB: [birth year]     Chief Complaint: Skin Check (Spot on face and thigh  - would like dermatology referral [facility]/Does not want to see Dr. Alwyn Ren)      History of Present Illness: Cassandra Elliott is a 58 y.o. female who comes in today with concern of skin lesion on her left cheek and left thigh.     She states that the skin lesion on her left cheek has been there for a long time but over the past month it is no longer flat and sticks out. She voices concern about the change. She has a history of basal cell skin lesions.     She also has a skin lesion on her left thigh that was previously being monitored by Carlye Grippe, PA - was advised that it was a seborrheic keratosis but she would like recheck.    She reports family history of basal cell and squamous cell carcinoma in her father.        No other acute concerns or complaints at this time.    Medical History     I have reviewed and updated as appropriate the past medical, surgical, family, and social history today:  Medical History/Surgical History/Family History/Social History  Past Medical History:   Diagnosis Date    B12 deficiency     Biceps tendinitis     Bulging lumbar disc     L3-L4 & T10-T11    Chronic radicular low back pain     Costochondritis     Endometriosis     Esophageal reflux     Floaters     left eye retina issue    Grief reaction with prolonged bereavement     Herniated disc, cervical     Hypertension     Hypokalemia     Migraines     Miscarriage 1986    Rash     Renal calculi     Somatic dysfunction of rib cage region     Umbilical hernia     @ birth    Upper respiratory infection     2024    Use of leuprolide acetate (Lupron)     2013 2014    Vitamin D deficiency          Past Surgical History:   Procedure  Laterality Date    CYSTOSCOPY      renal calculi    FULKERSON OSTEOTOMY      right  leg - tibial osteotomy later ligament release 2009    HX APPENDECTOMY  1992    HX CARPAL TUNNEL RELEASE      right 2014 left 2018    HX COLPOSCOPY      2017    HX DACRYOCYSTORHINOSTOMY      HX LUMBAR DISKECTOMY      01/1997    HX MENISCECTOMY      torn  - arthroscopy 1992    HX PELVIC LAPAROSCOPY      endomeetriosis    HX TONSIL AND ADENOIDECTOMY  1968    KNEE SURGERY Left 1983    tendon/ligamne t relesae and repair    LAMINECTOMY      revision    LASIK      may 2005    LUMBAR  EPIDURAL INJECTION      steroid Dr. Jeannett Senior long 2017    SKIN CANCER EXCISION      basal cell 2001    UMBILICAL HERNIA REPAIR  12/2015    UMBILICAL HERNIA REPAIR      2017     Family Medical History:       Problem Relation (Age of Onset)    Asthma Mother    Cancer Mother    Elevated Lipids Father    Hypertension (High Blood Pressure) Father    Myelodysplastic syndrome Mother    Stroke Father             Social History     Socioeconomic History    Marital status: Divorced   Tobacco Use    Smoking status: Never    Smokeless tobacco: Never   Vaping Use    Vaping status: Never Used   Substance and Sexual Activity    Alcohol use: Never    Drug use: Never       Allergies:  Allergies   Allergen Reactions    Venom-Honey Bee Anaphylaxis       Medications:  Current Outpatient Medications   Medication Sig    amLODIPine (NORVASC) 2.5 mg Oral Tablet TAKE 1 TABLET (2.5 MG TOTAL) BY MOUTH EVERY DAY    aspirin 81 mg Oral Tablet, Chewable Chew 1 Tablet (81 mg total) Once a day    Butalbital-Acetaminophen-Caff 50-300-40 mg Oral Capsule Take 1 Capsule by mouth Every 8 hours as needed    cetirizine (ZYRTEC) 10 mg Oral Tablet Take 1 Tablet (10 mg total) by mouth Once a day    Cholecalciferol, Vitamin D3, 25 mcg (1,000 unit) Oral Capsule Take 5 Capsules (5,000 Units total) by mouth Once a day    cranberry conc/ascorbic acid (SUPER CRANBERRY ORAL) Take 8,400 mg by mouth Once a day  Uses OTC 8400    cyanocobalamin (VITAMIN B 12) 1,000 mcg Oral Tablet Take 1 Tablet (1,000 mcg total) by mouth Every 3 days    cyclobenzaprine (FLEXERIL) 10 mg Oral Tablet TAKE 1 TABLET (10 MG TOTAL) BY MOUTH EVERY NIGHT AS NEEDED    EPINEPHrine 0.3 mg/0.3 mL Injection Auto-Injector INJECT 0.3 ML (0.3 MG TOTAL) INTO THE MUSCLE ONCE, AS NEEDED FOR UP TO 1 DOSE    fluticasone propionate (FLONASE) 50 mcg/actuation Nasal Spray, Suspension Administer 2 Sprays into each nostril Once a day    Ibuprofen (MOTRIN) 800 mg Oral Tablet TAKE 1 TABLET BY MOUTH EVERY 8 HOURS AS NEEDED FOR PAIN    Magnesium Gluconate 27 mg magnesium (500 mg) Oral Tablet Take 500 mg by mouth Once a day    Naratriptan 2.5 mg Oral Tablet TAKE 1 TABLET (2.5 MG TOTAL) BY MOUTH ONCE PER DAY AS NEEDED FOR MIGRAINE FOR UP TO 1 DOSE    ondansetron (ZOFRAN) 4 mg Oral Tablet Take 1 Tablet (4 mg total) by mouth Every 8 hours as needed    potassium chloride (KLOR-CON M20) 20 mEq Oral Tab Sust.Rel. Particle/Crystal Take 1 Tablet (20 mEq total) by mouth Three times a day    pregabalin (LYRICA) 50 mg Oral Capsule 1 po Q AM, 1 po mid day, and 2 po HS    RABEprazole (ACIPHEX) 20 mg Oral Tablet, Delayed Release (E.C.) TAKE 1 TABLET BY MOUTH ONCE A DAY    Saccharomyces boulardii (FLORASTOR) 250 mg Oral Capsule Take 1 Capsule (250 mg total) by mouth Once a day    triamterene-hydroCHLOROthiazide (MAXZIDE-25) 37.5-25 mg Oral Tablet Take  1 Tablet by mouth Every morning    valACYclovir (VALTREX) 1 gram Oral Tablet Take 1 Tablet (1 g total) by mouth Twice daily    valsartan (DIOVAN) 160 mg Oral Tablet Take 1 Tablet (160 mg total) by mouth Once a day       Problem List:  Patient Active Problem List    Diagnosis Date Noted    Endometriosis 09/05/2022    Essential hypertension     Migraines     Vitamin D deficiency     B12 deficiency     Grief reaction with prolonged bereavement     Hypokalemia     Esophageal reflux        Objective   Vitals:  BP 120/76 (Site: Left Arm, Patient  Position: Sitting, Cuff Size: Adult Large)   Pulse 82   Temp 36.7 C (98.1 F)   Resp 20   Wt 80 kg (176 lb 6 oz)   SpO2 95%       Physical Exam:  Physical Exam  Vitals and nursing note reviewed.   Skin:            Comments: Raised, flesh colored skin lesion noted on left cheek. Smooth borders. No surrounding erythema. See picture under media.    Raised, pigmented skin lesion on left thigh - consistent with seborrheic keratosis            Assessment/Plan     Diagnosis/Plan:    ICD-10-CM    1. Skin lesion  L98.9 Referral to External Provider      2. 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 Did try to reassure her about the left thigh lesion which is strongly consistent with seborrheic keratosis.    Encounter Medications and Orders  Orders Placed This Encounter    Referral to External Provider       Return as previously scheduled.      Arnell Sieving, DO    This note was partially created using M*Modal fluency direct system (voice recognition software ) and is inherently subject to errors including those of syntax and "sound- alike" substitutions which may escape proofreading.  In such instances, original meaning may be extrapolated by contextual derivation.Marland Kitchen

## 2023-08-10 NOTE — Nursing Note (Signed)
 Medication list reviewed with patient. Unable to validate accuracy of med list due to medication bottles were not available at visit.   Encouraged patient to bring bottles of all prescribed medications to visits.      Patient states no change in medications since last  visit

## 2023-08-10 NOTE — Progress Notes (Deleted)
 FAMILY MEDICINE, [facility]  [address]  [place] [address]  Operated by [facility]    Acute Office Visit      NAME: Cassandra Elliott DOS: 08/10/2023   MRN: [id number] DOB: [birth year]     Chief Complaint: Skin Check (Spot on face and thigh  - would like dermatology referral [facility]/Does not want to see Dr. Alwyn Ren)      History of Present Illness: Cassandra Elliott is a 58 y.o. female who comes in today ***     Face: has been there for years  Thigh:     Family history of basal and squamous  Personal history of basal off of her back    No other acute concerns or complaints at this time.    Medical History     I have reviewed and updated as appropriate the past medical, surgical, family, and social history today:  Medical History/Surgical History/Family History/Social History  Past Medical History:   Diagnosis Date    B12 deficiency     Biceps tendinitis     Bulging lumbar disc     L3-L4 & T10-T11    Chronic radicular low back pain     Costochondritis     Endometriosis     Esophageal reflux     Floaters     left eye retina issue    Grief reaction with prolonged bereavement     Herniated disc, cervical     Hypertension     Hypokalemia     Migraines     Miscarriage 1986    Rash     Renal calculi     Somatic dysfunction of rib cage region     Umbilical hernia     @ birth    Upper respiratory infection     2024    Use of leuprolide acetate (Lupron)     2013 2014    Vitamin D deficiency          Past Surgical History:   Procedure Laterality Date    CYSTOSCOPY      renal calculi    FULKERSON OSTEOTOMY      right  leg - tibial osteotomy later ligament release 2009    HX APPENDECTOMY  1992    HX CARPAL TUNNEL RELEASE      right 2014 left 2018    HX COLPOSCOPY      2017    HX DACRYOCYSTORHINOSTOMY      HX LUMBAR DISKECTOMY      01/1997    HX MENISCECTOMY      torn  - arthroscopy 1992    HX PELVIC LAPAROSCOPY      endomeetriosis    HX TONSIL AND ADENOIDECTOMY  1968     KNEE SURGERY Left 1983    tendon/ligamne t relesae and repair    LAMINECTOMY      revision    LASIK      may 2005    LUMBAR EPIDURAL INJECTION      steroid Dr. Jeannett Senior long 2017    SKIN CANCER EXCISION      basal cell 2001    UMBILICAL HERNIA REPAIR  12/2015    UMBILICAL HERNIA REPAIR      2017     Family Medical History:       Problem Relation (Age of Onset)    Asthma Mother    Cancer Mother    Elevated Lipids Father    Hypertension (High Blood Pressure)  Father    Myelodysplastic syndrome Mother    Stroke Father             Social History     Socioeconomic History    Marital status: Divorced   Tobacco Use    Smoking status: Never    Smokeless tobacco: Never   Vaping Use    Vaping status: Never Used   Substance and Sexual Activity    Alcohol use: Never    Drug use: Never       Allergies:  Allergies   Allergen Reactions    Venom-Honey Bee Anaphylaxis       Medications:  Current Outpatient Medications   Medication Sig    amLODIPine (NORVASC) 2.5 mg Oral Tablet TAKE 1 TABLET (2.5 MG TOTAL) BY MOUTH EVERY DAY    aspirin 81 mg Oral Tablet, Chewable Chew 1 Tablet (81 mg total) Once a day    Butalbital-Acetaminophen-Caff 50-300-40 mg Oral Capsule Take 1 Capsule by mouth Every 8 hours as needed    cetirizine (ZYRTEC) 10 mg Oral Tablet Take 1 Tablet (10 mg total) by mouth Once a day    Cholecalciferol, Vitamin D3, 25 mcg (1,000 unit) Oral Capsule Take 5 Capsules (5,000 Units total) by mouth Once a day    cranberry conc/ascorbic acid (SUPER CRANBERRY ORAL) Take 8,400 mg by mouth Once a day Uses OTC 8400    cyanocobalamin (VITAMIN B 12) 1,000 mcg Oral Tablet Take 1 Tablet (1,000 mcg total) by mouth Every 3 days    cyclobenzaprine (FLEXERIL) 10 mg Oral Tablet TAKE 1 TABLET (10 MG TOTAL) BY MOUTH EVERY NIGHT AS NEEDED    EPINEPHrine 0.3 mg/0.3 mL Injection Auto-Injector INJECT 0.3 ML (0.3 MG TOTAL) INTO THE MUSCLE ONCE, AS NEEDED FOR UP TO 1 DOSE    fluticasone propionate (FLONASE) 50 mcg/actuation Nasal Spray, Suspension  Administer 2 Sprays into each nostril Once a day    Ibuprofen (MOTRIN) 800 mg Oral Tablet TAKE 1 TABLET BY MOUTH EVERY 8 HOURS AS NEEDED FOR PAIN    Magnesium Gluconate 27 mg magnesium (500 mg) Oral Tablet Take 500 mg by mouth Once a day    Naratriptan 2.5 mg Oral Tablet TAKE 1 TABLET (2.5 MG TOTAL) BY MOUTH ONCE PER DAY AS NEEDED FOR MIGRAINE FOR UP TO 1 DOSE    ondansetron (ZOFRAN) 4 mg Oral Tablet Take 1 Tablet (4 mg total) by mouth Every 8 hours as needed    potassium chloride (KLOR-CON M20) 20 mEq Oral Tab Sust.Rel. Particle/Crystal Take 1 Tablet (20 mEq total) by mouth Three times a day    pregabalin (LYRICA) 50 mg Oral Capsule 1 po Q AM, 1 po mid day, and 2 po HS    RABEprazole (ACIPHEX) 20 mg Oral Tablet, Delayed Release (E.C.) TAKE 1 TABLET BY MOUTH ONCE A DAY    Saccharomyces boulardii (FLORASTOR) 250 mg Oral Capsule Take 1 Capsule (250 mg total) by mouth Once a day    triamterene-hydroCHLOROthiazide (MAXZIDE-25) 37.5-25 mg Oral Tablet Take 1 Tablet by mouth Every morning    valACYclovir (VALTREX) 1 gram Oral Tablet Take 1 Tablet (1 g total) by mouth Twice daily    valsartan (DIOVAN) 160 mg Oral Tablet Take 1 Tablet (160 mg total) by mouth Once a day       Problem List:  Patient Active Problem List    Diagnosis Date Noted    Endometriosis 09/05/2022    Essential hypertension     Migraines     Vitamin D deficiency  B12 deficiency     Grief reaction with prolonged bereavement     Hypokalemia     Esophageal reflux        Objective   Vitals:  BP 120/76 (Site: Left Arm, Patient Position: Sitting, Cuff Size: Adult Large)   Pulse 82   Temp 36.7 C (98.1 F)   Resp 20   Wt 80 kg (176 lb 6 oz)   SpO2 95%       Physical Exam:  Physical Exam       Assessment/Plan     Diagnosis/Plan:  No diagnosis found.  ***    Encounter Medications and Orders  No orders of the defined types were placed in this encounter.      No follow-ups on file.      Cassandra Sieving, DO    This note was partially created using M*Modal  fluency direct system (voice recognition software ) and is inherently subject to errors including those of syntax and "sound- alike" substitutions which may escape proofreading.  In such instances, original meaning may be extrapolated by contextual derivation.Marland Kitchen

## 2023-08-11 ENCOUNTER — Telehealth (RURAL_HEALTH_CENTER): Payer: Self-pay | Admitting: Family Medicine

## 2023-08-11 NOTE — Telephone Encounter (Signed)
 Cassandra Elliott has an appt 08/23/2023 @ 10:20 with [facility] in the [place] office. I spoke to the patient and is aware of the appt. Jlester   Phone# [phone]

## 2023-08-19 ENCOUNTER — Other Ambulatory Visit: Payer: Self-pay

## 2023-08-19 ENCOUNTER — Encounter (RURAL_HEALTH_CENTER): Payer: Self-pay | Admitting: Family Medicine

## 2023-08-19 ENCOUNTER — Ambulatory Visit (RURAL_HEALTH_CENTER): Attending: Family Medicine | Admitting: Family Medicine

## 2023-08-19 DIAGNOSIS — Z538 Procedure and treatment not carried out for other reasons: Secondary | ICD-10-CM | POA: Insufficient documentation

## 2023-08-19 DIAGNOSIS — F411 Generalized anxiety disorder: Secondary | ICD-10-CM

## 2023-08-19 DIAGNOSIS — F32A Depression, unspecified: Secondary | ICD-10-CM

## 2023-08-19 DIAGNOSIS — F4329 Adjustment disorder with other symptoms: Secondary | ICD-10-CM

## 2023-08-19 NOTE — Progress Notes (Signed)
 FAMILY MEDICINE, [facility]  [address] [place]  [place] [address]  Operated by [facility]  Telephone Visit    Name:  Cassandra Elliott MRN: [id number]   Date:  08/19/2023 Age:   58 y.o.     The patient/family initiated a request for telephone service.  Verbal consent for this service was obtained from the patient/family.    Last office visit in this department: 08/10/2023      Reason for call:   Chief Complaint   Patient presents with    Referrals     Would like referral to [place] Mcfadden @ [facility]        Call notes:  Generalized Anxiety/Depressive disorder:  Has been going to different counselor regularly but she is leaving for a new job and Cassandra Elliott would like a new referral.  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   No homicidal or suicidal ideations            ICD-10-CM    1. 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 GAD (generalized anxiety disorder)  F41.1 Referral to External Provider      3. Depressive disorder  F32.A Referral to External Provider        Will refer to Lubertha Rush at [facility]. Follow up if you develop homicidal or suicidal ideations or if you feel that you  need additional help such as medication therapy to treat your symptoms.     Total provider time spent with the patient on the phone: 4 minutes.    Jamiah Homeyer, DO

## 2023-08-30 ENCOUNTER — Telehealth (RURAL_HEALTH_CENTER): Payer: Self-pay | Admitting: Family Medicine

## 2023-08-30 NOTE — Telephone Encounter (Signed)
 Cassandra Elliott has an appt 10/04/2023 @ 12:30 with [name]. I spoke to the patient and is aware of the appt. Jlester   Ph# [phone]

## 2023-09-22 ENCOUNTER — Other Ambulatory Visit (RURAL_HEALTH_CENTER): Payer: Self-pay | Admitting: Family Medicine

## 2023-09-22 DIAGNOSIS — K219 Gastro-esophageal reflux disease without esophagitis: Secondary | ICD-10-CM

## 2023-10-28 ENCOUNTER — Other Ambulatory Visit (RURAL_HEALTH_CENTER): Payer: Self-pay | Admitting: Family Medicine

## 2023-10-28 DIAGNOSIS — M5410 Radiculopathy, site unspecified: Secondary | ICD-10-CM

## 2023-11-12 ENCOUNTER — Ambulatory Visit (RURAL_HEALTH_CENTER): Payer: Self-pay | Attending: Family Medicine | Admitting: Family Medicine

## 2023-11-12 ENCOUNTER — Other Ambulatory Visit: Payer: Self-pay

## 2023-11-12 ENCOUNTER — Encounter (RURAL_HEALTH_CENTER): Payer: Self-pay | Admitting: Family Medicine

## 2023-11-12 VITALS — BP 109/64 | HR 106 | Temp 98.4°F | Resp 16 | Wt 178.2 lb

## 2023-11-12 DIAGNOSIS — F32A Depression, unspecified: Secondary | ICD-10-CM | POA: Insufficient documentation

## 2023-11-12 DIAGNOSIS — Z9229 Personal history of other drug therapy: Secondary | ICD-10-CM | POA: Insufficient documentation

## 2023-11-12 DIAGNOSIS — Z79899 Other long term (current) drug therapy: Secondary | ICD-10-CM | POA: Insufficient documentation

## 2023-11-12 DIAGNOSIS — E663 Overweight: Secondary | ICD-10-CM | POA: Insufficient documentation

## 2023-11-12 DIAGNOSIS — F4320 Adjustment disorder, unspecified: Secondary | ICD-10-CM | POA: Insufficient documentation

## 2023-11-12 MED ORDER — BUPROPION HCL XL 150 MG 24 HR TABLET, EXTENDED RELEASE
150.0000 mg | ORAL_TABLET | Freq: Every day | ORAL | 0 refills | Status: DC
Start: 2023-11-12 — End: 2023-12-15

## 2023-11-12 MED ORDER — BUSPIRONE 5 MG TABLET
5.0000 mg | ORAL_TABLET | Freq: Three times a day (TID) | ORAL | 0 refills | Status: AC | PRN
Start: 2023-11-12 — End: ?

## 2023-11-12 NOTE — Progress Notes (Signed)
 FAMILY MEDICINE, [facility]  [address]  [place] [address]  Operated by [facility]    Acute Office Visit      NAME: Cassandra Elliott DOS: 11/12/2023   MRN: [id number] DOB: 08-Dec-1965     Chief Complaint: Medication Check (Discuss weight loss medications and is traveling for a wedding discuss vaccines. )      History of Present Illness: Cassandra Elliott is a 58 y.o. female who comes in today to discuss medications for weight loss and discuss any possible immunizations that she may need prior to going to a wedding in [place].     She also reports increased depression today due to having to put her dog down who had been sick.  She also states that she continues to grieve the passing of both of her parents and even though she has been going to therapy continues to cry regularly. Believes she may be interested in trial of medication at this time.         No other acute concerns or complaints at this time.    Medical History     I have reviewed and updated as appropriate the past medical, surgical, family, and social history today:  Medical History/Surgical History/Family History/Social History  Past Medical History:   Diagnosis Date    B12 deficiency     Biceps tendinitis     Bulging lumbar disc     L3-L4 & T10-T11    Chronic radicular low back pain     Costochondritis     Endometriosis     Esophageal reflux     Floaters     left eye retina issue    Grief reaction with prolonged bereavement     Herniated disc, cervical     Hypertension     Hypokalemia     Migraines     Miscarriage 1986    Rash     Renal calculi     Somatic dysfunction of rib cage region     Umbilical hernia     @ birth    Upper respiratory infection     2024    Use of leuprolide acetate (Lupron)     2013 2014    Vitamin D  deficiency          Past Surgical History:   Procedure Laterality Date    CYSTOSCOPY      renal calculi    FULKERSON OSTEOTOMY      right  leg - tibial osteotomy later ligament release  2009    HX APPENDECTOMY  1992    HX CARPAL TUNNEL RELEASE      right 2014 left 2018    HX COLPOSCOPY      2017    HX DACRYOCYSTORHINOSTOMY      HX LUMBAR DISKECTOMY      01/1997    HX MENISCECTOMY      torn  - arthroscopy 1992    HX PELVIC LAPAROSCOPY      endomeetriosis    HX TONSIL AND ADENOIDECTOMY  1968    KNEE SURGERY Left 1983    tendon/ligamne t relesae and repair    LAMINECTOMY      revision    LASIK      may 2005    LUMBAR EPIDURAL INJECTION      steroid Dr. Garnette long 2017    SKIN CANCER EXCISION      basal cell 2001    UMBILICAL HERNIA REPAIR  12/2015  UMBILICAL HERNIA REPAIR      2017     Family Medical History:       Problem Relation (Age of Onset)    Asthma Mother    Cancer Mother    Elevated Lipids Father    Hypertension (High Blood Pressure) Father    Myelodysplastic syndrome Mother    Stroke Father             Social History     Socioeconomic History    Marital status: Divorced   Tobacco Use    Smoking status: Never    Smokeless tobacco: Never   Vaping Use    Vaping status: Never Used   Substance and Sexual Activity    Alcohol use: Never    Drug use: Never       Allergies:  Allergies[1]    Medications:  Current Outpatient Medications   Medication Sig    amLODIPine  (NORVASC) 2.5 mg Oral Tablet TAKE 1 TABLET (2.5 MG TOTAL) BY MOUTH EVERY DAY    aspirin 81 mg Oral Tablet, Chewable Chew 1 Tablet (81 mg total) Daily    Butalbital -Acetaminophen -Caff 50-300-40 mg Oral Capsule Take 1 Capsule by mouth Every 8 hours as needed    cetirizine (ZYRTEC) 10 mg Oral Tablet Take 1 Tablet (10 mg total) by mouth Daily    Cholecalciferol, Vitamin D3, 25 mcg (1,000 unit) Oral Capsule Take 5 Capsules (5,000 Units total) by mouth Daily    cranberry conc/ascorbic acid (SUPER CRANBERRY ORAL) Take 8,400 mg by mouth Once a day Uses OTC 8400    cyanocobalamin (VITAMIN B 12) 1,000 mcg Oral Tablet Take 1 Tablet (1,000 mcg total) by mouth Every 3 days    cyclobenzaprine  (FLEXERIL ) 10 mg Oral Tablet TAKE 1 TABLET (10 MG TOTAL)  BY MOUTH EVERY NIGHT AS NEEDED    EPINEPHrine  0.3 mg/0.3 mL Injection Auto-Injector INJECT 0.3 ML (0.3 MG TOTAL) INTO THE MUSCLE ONCE, AS NEEDED FOR UP TO 1 DOSE    fluticasone  propionate (FLONASE ) 50 mcg/actuation Nasal Spray, Suspension Administer 2 Sprays into each nostril Once a day    Ibuprofen  (MOTRIN ) 800 mg Oral Tablet TAKE 1 TABLET BY MOUTH EVERY 8 HOURS AS NEEDED FOR PAIN    Magnesium Gluconate 27 mg magnesium (500 mg) Oral Tablet Take 500 mg by mouth Once a day    Naratriptan  2.5 mg Oral Tablet TAKE 1 TABLET (2.5 MG TOTAL) BY MOUTH ONCE PER DAY AS NEEDED FOR MIGRAINE FOR UP TO 1 DOSE    ondansetron  (ZOFRAN ) 4 mg Oral Tablet Take 1 Tablet (4 mg total) by mouth Every 8 hours as needed    potassium chloride  (KLOR-CON  M20) 20 mEq Oral Tab Sust.Rel. Particle/Crystal Take 1 Tablet (20 mEq total) by mouth Three times a day    pregabalin  (LYRICA ) 50 mg Oral Capsule TAKE 1 CAPSULE BY MOUTH EVERY DAY IN THE MORNING, 1 CAP MID DAY, AND 2 CAPS AT BEDTIME    RABEprazole  (ACIPHEX ) 20 mg Oral Tablet, Delayed Release (E.C.) TAKE 1 TABLET BY MOUTH ONCE A DAY    Saccharomyces boulardii (FLORASTOR) 250 mg Oral Capsule Take 1 Capsule (250 mg total) by mouth Daily    triamterene -hydroCHLOROthiazide  (MAXZIDE -25) 37.5-25 mg Oral Tablet Take 1 Tablet by mouth Every morning    valACYclovir  (VALTREX ) 1 gram Oral Tablet Take 1 Tablet (1 g total) by mouth Twice daily    valsartan  (DIOVAN ) 160 mg Oral Tablet Take 1 Tablet (160 mg total) by mouth Once a day  Problem List:  Patient Active Problem List    Diagnosis Date Noted    Endometriosis 09/05/2022    Essential hypertension     Migraines     Vitamin D  deficiency     B12 deficiency     Grief reaction with prolonged bereavement     Hypokalemia     Esophageal reflux        Objective   Vitals:  BP 109/64 (Site: Left Arm, Patient Position: Sitting, Cuff Size: Adult)   Pulse (!) 106   Temp 36.9 C (98.4 F) (Skin)   Resp 16   Wt 80.9 kg (178 lb 4 oz)   SpO2 98%       Physical  Exam:  Physical Exam  Vitals and nursing note reviewed.   Constitutional:       General: She is not in acute distress.     Appearance: Normal appearance.   HENT:      Mouth/Throat:      Mouth: Mucous membranes are moist.   Eyes:      Pupils: Pupils are equal, round, and reactive to light.   Neck:      Vascular: No carotid bruit.   Cardiovascular:      Rate and Rhythm: Normal rate and regular rhythm.      Heart sounds: No murmur heard.  Pulmonary:      Effort: Pulmonary effort is normal.      Breath sounds: Normal breath sounds.   Abdominal:      General: Bowel sounds are normal.      Palpations: Abdomen is soft.      Tenderness: There is no abdominal tenderness. There is no guarding.   Musculoskeletal:         General: No swelling or deformity.      Cervical back: Neck supple. No tenderness.      Right lower leg: No edema.      Left lower leg: No edema.   Lymphadenopathy:      Cervical: No cervical adenopathy.   Skin:     General: Skin is warm and dry.   Neurological:      General: No focal deficit present.      Mental Status: She is alert and oriented to person, place, and time.   Psychiatric:         Mood and Affect: Mood normal.         Thought Content: Thought content normal.            Assessment/Plan     Diagnosis/Plan:    ICD-10-CM    1. Overweight  E66.3       2. Immunizations reviewed and up to date  Z92.29       3. Grief reaction  F43.20 busPIRone  (BUSPAR ) 5 mg Oral Tablet      4. Depressive disorder  F32.A buPROPion  (WELLBUTRIN  XL) 150 mg extended release 24 hr tablet     busPIRone  (BUSPAR ) 5 mg Oral Tablet        CDC guidelines for immunizations to [place] reviewed. She was concerned about her TDAP but it is UTD. Advised her to follow up at [facility] for any additional vaccines that may be required.     She declines any medications for weight loss right now including phentermine or contrave    Discussed pros/cons of starting Wellbutrin  and buspar  and reviewed the end goal of adding this. Side  effects were reviewed in detail as well.  Advised patient to call if unwanted  side effects or concerns with Wellbutrin  and buspar  occur prior to our next visit      Encounter Medications and Orders  Orders Placed This Encounter    buPROPion  (WELLBUTRIN  XL) 150 mg extended release 24 hr tablet    busPIRone  (BUSPAR ) 5 mg Oral Tablet       Return in about 4 weeks (around 12/10/2023) for Telephone Visit, Medication Follow Up.      Harlene Spire, DO    This note was partially created using M*Modal fluency direct system (voice recognition software ) and is inherently subject to errors including those of syntax and "sound- alike substitutions which may escape proofreading.  In such instances, original meaning may be extrapolated by contextual derivation..       [1]   Allergies  Allergen Reactions    Venom-Honey Bee Anaphylaxis

## 2023-11-22 ENCOUNTER — Telehealth (RURAL_HEALTH_CENTER): Payer: Self-pay | Admitting: Family Medicine

## 2023-11-22 DIAGNOSIS — G8929 Other chronic pain: Secondary | ICD-10-CM

## 2023-11-22 DIAGNOSIS — M542 Cervicalgia: Secondary | ICD-10-CM

## 2023-11-22 NOTE — Nursing Note (Signed)
 Medical Message. Patient called requesting new order for medical message as the new year for her insurance begins 11/23/23

## 2023-12-15 ENCOUNTER — Ambulatory Visit (RURAL_HEALTH_CENTER): Attending: Family Medicine | Admitting: Family Medicine

## 2023-12-15 ENCOUNTER — Encounter (RURAL_HEALTH_CENTER): Payer: Self-pay | Admitting: Family Medicine

## 2023-12-15 ENCOUNTER — Other Ambulatory Visit: Payer: Self-pay

## 2023-12-15 DIAGNOSIS — Z79899 Other long term (current) drug therapy: Secondary | ICD-10-CM | POA: Insufficient documentation

## 2023-12-15 DIAGNOSIS — F32A Depression, unspecified: Secondary | ICD-10-CM | POA: Insufficient documentation

## 2023-12-15 DIAGNOSIS — K5900 Constipation, unspecified: Secondary | ICD-10-CM | POA: Insufficient documentation

## 2023-12-15 MED ORDER — BUPROPION HCL XL 150 MG 24 HR TABLET, EXTENDED RELEASE: 150.0000 mg | ORAL_TABLET | Freq: Every day | ORAL | 0 refills | Status: DC

## 2023-12-15 NOTE — Progress Notes (Signed)
 FAMILY MEDICINE, [facility]  [address] [place]  [place] [address]  Operated by [facility]  Telephone Visit    Name:  Cassandra Elliott MRN: [id number]   Date:  12/15/2023 DOB: November 25, 1965 (58 y.o.)          The patient/family initiated a request for telephone service.  Verbal consent for this service was obtained from the patient/family.  Reason for audio only:Patient cannot use or does not consent to using video technology    Last office visit in this department: 11/12/2023      Reason for call:   Chief Complaint   Patient presents with    Medication Check F/U     1 month follow up doing well on welburtin but is causing some constipation        Call notes:  States she is doing well with current Wellbutrin . She does feel that she is doing better - also going to counseling with Tonya McFadden. She reports that she is experiencing mild constipation with the medication - using Senna PRN which is helpful.     PHQ Questionnaire  Little interest or pleasure in doing things.: Several Days  Feeling down, depressed, or hopeless: Several Days  PHQ 2 Total: 2  Trouble falling or staying asleep, or sleeping too much.: Several Days  Feeling tired or having little energy: Several Days  Poor appetite or overeating: Not at all  Feeling bad about yourself/ that you are a failure in the past 2 weeks?: Not at all  Trouble concentrating on things in the past 2 weeks?: Not at all  Moving/Speaking slowly or being fidgety or restless  in the past 2 weeks?: Not at all  Thoughts that you would be better off DEAD, or of hurting yourself in some way.: Not at all  If you checked off any problems, how difficult have these problems made it for you to do your work, take care of things at home, or get along with other people?: Not difficult at all  PHQ 9 Total: 4  Interpretation of Total Score: 0-4 No depression          ICD-10-CM    1. Constipation, unspecified constipation type  K59.00       2. Depressive  disorder  F32.A buPROPion  (WELLBUTRIN  XL) 150 mg extended release 24 hr tablet          LOS Determination: Medical Decision Making- Direct audio communication with patient was 5 minutes    Tamorah Hada, DO

## 2023-12-17 ENCOUNTER — Encounter (RURAL_HEALTH_CENTER): Payer: Self-pay | Admitting: Family Medicine

## 2023-12-22 ENCOUNTER — Other Ambulatory Visit (RURAL_HEALTH_CENTER): Payer: Self-pay | Admitting: Family Medicine

## 2023-12-22 DIAGNOSIS — K219 Gastro-esophageal reflux disease without esophagitis: Secondary | ICD-10-CM

## 2024-01-15 ENCOUNTER — Other Ambulatory Visit (RURAL_HEALTH_CENTER): Payer: Self-pay | Admitting: Family Medicine

## 2024-01-15 DIAGNOSIS — M5410 Radiculopathy, site unspecified: Secondary | ICD-10-CM

## 2024-01-22 ENCOUNTER — Other Ambulatory Visit (RURAL_HEALTH_CENTER): Payer: Self-pay | Admitting: Family Medicine

## 2024-01-22 DIAGNOSIS — I10 Essential (primary) hypertension: Secondary | ICD-10-CM

## 2024-01-22 DIAGNOSIS — E876 Hypokalemia: Secondary | ICD-10-CM

## 2024-01-28 ENCOUNTER — Other Ambulatory Visit (RURAL_HEALTH_CENTER): Payer: Self-pay | Admitting: Family Medicine

## 2024-01-28 DIAGNOSIS — I10 Essential (primary) hypertension: Secondary | ICD-10-CM

## 2024-02-12 ENCOUNTER — Other Ambulatory Visit (RURAL_HEALTH_CENTER): Payer: Self-pay | Admitting: Family Medicine

## 2024-02-12 DIAGNOSIS — M5410 Radiculopathy, site unspecified: Secondary | ICD-10-CM

## 2024-02-27 ENCOUNTER — Other Ambulatory Visit (RURAL_HEALTH_CENTER): Payer: Self-pay | Admitting: Family Medicine

## 2024-02-27 DIAGNOSIS — M5410 Radiculopathy, site unspecified: Secondary | ICD-10-CM

## 2024-03-10 ENCOUNTER — Other Ambulatory Visit (RURAL_HEALTH_CENTER): Payer: Self-pay | Admitting: Family Medicine

## 2024-03-10 DIAGNOSIS — M5410 Radiculopathy, site unspecified: Secondary | ICD-10-CM

## 2024-03-13 MED ORDER — PREGABALIN 50 MG CAPSULE: ORAL_CAPSULE | ORAL | 0 refills | Status: DC

## 2024-03-18 ENCOUNTER — Other Ambulatory Visit (RURAL_HEALTH_CENTER): Payer: Self-pay | Admitting: Family Medicine

## 2024-03-18 DIAGNOSIS — M5410 Radiculopathy, site unspecified: Secondary | ICD-10-CM

## 2024-03-19 ENCOUNTER — Other Ambulatory Visit (RURAL_HEALTH_CENTER): Payer: Self-pay | Admitting: Family Medicine

## 2024-03-19 DIAGNOSIS — K219 Gastro-esophageal reflux disease without esophagitis: Secondary | ICD-10-CM

## 2024-03-21 ENCOUNTER — Ambulatory Visit (RURAL_HEALTH_CENTER): Admitting: Family Medicine

## 2024-03-27 ENCOUNTER — Other Ambulatory Visit (RURAL_HEALTH_CENTER): Payer: Self-pay | Admitting: Family Medicine

## 2024-03-27 DIAGNOSIS — K219 Gastro-esophageal reflux disease without esophagitis: Secondary | ICD-10-CM

## 2024-03-27 MED ORDER — RABEPRAZOLE 20 MG TABLET,DELAYED RELEASE: 20.0000 mg | DELAYED_RELEASE_TABLET | Freq: Every day | ORAL | 0 refills | Status: DC

## 2024-04-18 ENCOUNTER — Other Ambulatory Visit (RURAL_HEALTH_CENTER): Payer: Self-pay | Admitting: Family Medicine

## 2024-04-18 DIAGNOSIS — E876 Hypokalemia: Secondary | ICD-10-CM

## 2024-04-21 ENCOUNTER — Other Ambulatory Visit (RURAL_HEALTH_CENTER): Payer: Self-pay | Admitting: Family Medicine

## 2024-04-21 DIAGNOSIS — K219 Gastro-esophageal reflux disease without esophagitis: Secondary | ICD-10-CM

## 2024-04-22 ENCOUNTER — Other Ambulatory Visit (RURAL_HEALTH_CENTER): Payer: Self-pay | Admitting: Family Medicine

## 2024-04-22 DIAGNOSIS — I10 Essential (primary) hypertension: Secondary | ICD-10-CM

## 2024-04-23 ENCOUNTER — Other Ambulatory Visit: Payer: Self-pay

## 2024-04-24 ENCOUNTER — Ambulatory Visit (RURAL_HEALTH_CENTER): Admitting: Family Medicine

## 2024-04-24 ENCOUNTER — Encounter (RURAL_HEALTH_CENTER): Payer: Self-pay | Admitting: Family Medicine

## 2024-04-24 VITALS — BP 118/79 | HR 88 | Temp 98.1°F | Ht 65.0 in | Wt 178.0 lb

## 2024-04-24 DIAGNOSIS — G43909 Migraine, unspecified, not intractable, without status migrainosus: Secondary | ICD-10-CM

## 2024-04-24 DIAGNOSIS — K219 Gastro-esophageal reflux disease without esophagitis: Secondary | ICD-10-CM

## 2024-04-24 DIAGNOSIS — I10 Essential (primary) hypertension: Secondary | ICD-10-CM

## 2024-04-24 DIAGNOSIS — Z1231 Encounter for screening mammogram for malignant neoplasm of breast: Secondary | ICD-10-CM

## 2024-04-24 DIAGNOSIS — E876 Hypokalemia: Secondary | ICD-10-CM

## 2024-04-24 DIAGNOSIS — M5410 Radiculopathy, site unspecified: Secondary | ICD-10-CM

## 2024-04-24 DIAGNOSIS — F32A Depression, unspecified: Secondary | ICD-10-CM

## 2024-04-24 DIAGNOSIS — Z0000 Encounter for general adult medical examination without abnormal findings: Secondary | ICD-10-CM

## 2024-04-24 MED ORDER — CIPROFLOXACIN 500 MG TABLET: 500.0000 mg | ORAL_TABLET | Freq: Two times a day (BID) | ORAL | 0 refills | Status: AC

## 2024-04-24 MED ORDER — TRIAMTERENE 37.5 MG-HYDROCHLOROTHIAZIDE 25 MG TABLET: 1.0000 | ORAL_TABLET | Freq: Every morning | ORAL | 1 refills | Status: AC

## 2024-04-24 MED ORDER — BUPROPION HCL XL 150 MG 24 HR TABLET, EXTENDED RELEASE: 150.0000 mg | ORAL_TABLET | Freq: Every day | ORAL | 1 refills | Status: AC

## 2024-04-24 MED ORDER — CYCLOBENZAPRINE 10 MG TABLET: 10.0000 mg | ORAL_TABLET | Freq: Every evening | ORAL | 2 refills | Status: AC

## 2024-04-24 MED ORDER — AZITHROMYCIN 250 MG TABLET: ORAL_TABLET | ORAL | 0 refills | Status: AC

## 2024-04-24 MED ORDER — VALSARTAN 160 MG TABLET: 160.0000 mg | ORAL_TABLET | Freq: Every day | ORAL | 1 refills | Status: AC

## 2024-04-24 MED ORDER — AMLODIPINE 2.5 MG TABLET: 2.5000 mg | ORAL_TABLET | Freq: Every day | ORAL | 1 refills | Status: AC

## 2024-04-24 MED ORDER — PREGABALIN 50 MG CAPSULE: ORAL_CAPSULE | ORAL | 3 refills | Status: AC

## 2024-04-24 MED ORDER — RABEPRAZOLE 20 MG TABLET,DELAYED RELEASE: 20.0000 mg | DELAYED_RELEASE_TABLET | Freq: Every day | ORAL | 1 refills | Status: AC

## 2024-04-24 MED ORDER — FLUCONAZOLE 150 MG TABLET: 150.0000 mg | ORAL_TABLET | Freq: Once | ORAL | 0 refills | Status: AC

## 2024-04-24 MED ORDER — POTASSIUM CHLORIDE ER 20 MEQ TABLET,EXTENDED RELEASE(PART/CRYST): 20.0000 meq | ORAL_TABLET | Freq: Three times a day (TID) | ORAL | 1 refills | Status: AC

## 2024-04-24 MED ORDER — ONDANSETRON HCL 4 MG TABLET: 4.0000 mg | ORAL_TABLET | Freq: Three times a day (TID) | ORAL | 6 refills | Status: AC | PRN

## 2024-04-24 NOTE — Progress Notes (Signed)
 FAMILY MEDICINE, [facility]  [address] [place]  [place] [address]  Operated by [facility]     Name: Cassandra Elliott MRN:  [id number]   Date: 04/24/2024 Age: 58 y.o.          Provider: Harlene Spire, DO    Reason for visit: Follow Up 6 Months (Discuss vaccines - travel to [place] )      History of Present Illness         PHQ Questionnaire  Little interest or pleasure in doing things.: Several Days  Feeling down, depressed, or hopeless: Several Days  PHQ 2 Total: 2  Trouble falling or staying asleep, or sleeping too much.: Several Days  Feeling tired or having little energy: Several Days  Poor appetite or overeating: Several Days  Feeling bad about yourself/ that you are a failure in the past 2 weeks?: Not at all  Trouble concentrating on things in the past 2 weeks?: Not at all  Moving/Speaking slowly or being fidgety or restless  in the past 2 weeks?: Not at all  Thoughts that you would be better off DEAD, or of hurting yourself in some way.: Not at all  If you checked off any problems, how difficult have these problems made it for you to do your work, take care of things at home, or get along with other people?: Not difficult at all  PHQ 9 Total: 5  Interpretation of Total Score: 5-9 Mild depression              04/24/2024     2:25 PM   GAD-7 Questionnaire   Feeling nervous,anxious,on edge 0   Not being able to stop or control worrying 0   Worrying too much about different things 0   Trouble relaxing 0   Being so restless that it is hard to sit still 0   Becoming easily annoyed or irritable 1   Feeling afraid as if something awful might happen 0   How difficult have these problems made it for you to work, take care of things at home, or get along with other people? Not difficult at all   Gad-7 Score Total 1   Interpretation 0-4, normal             Historical Data    Past Medical History:  Past Medical History:   Diagnosis Date    B12 deficiency     Biceps tendinitis      Bulging lumbar disc     L3-L4 & T10-T11    Chronic radicular low back pain     Costochondritis     Endometriosis     Esophageal reflux     Floaters     left eye retina issue    Grief reaction with prolonged bereavement     Herniated disc, cervical     Hypertension     Hypokalemia     Migraines     Miscarriage 1986    Rash     Renal calculi     Somatic dysfunction of rib cage region     Umbilical hernia     @ birth    Upper respiratory infection     2024    Use of leuprolide acetate (Lupron)     2013 2014    Vitamin D  deficiency          Allergies:  Allergies[1]  Medications:  Current Outpatient Medications   Medication Sig    amLODIPine  (NORVASC) 2.5  mg Oral Tablet TAKE 1 TABLET (2.5 MG TOTAL) BY MOUTH EVERY DAY    aspirin 81 mg Oral Tablet, Chewable Chew 1 Tablet (81 mg total) Daily    buPROPion  (WELLBUTRIN  XL) 150 mg extended release 24 hr tablet Take 1 Tablet (150 mg total) by mouth Daily    busPIRone  (BUSPAR ) 5 mg Oral Tablet Take 1 Tablet (5 mg total) by mouth Three times a day as needed    Butalbital -Acetaminophen -Caff 50-300-40 mg Oral Capsule Take 1 Capsule by mouth Every 8 hours as needed    cetirizine (ZYRTEC) 10 mg Oral Tablet Take 1 Tablet (10 mg total) by mouth Daily    Cholecalciferol, Vitamin D3, 25 mcg (1,000 unit) Oral Capsule Take 5 Capsules (5,000 Units total) by mouth Daily    cranberry conc/ascorbic acid (SUPER CRANBERRY ORAL) Take 8,400 mg by mouth Once a day Uses OTC 8400    cyanocobalamin (VITAMIN B 12) 1,000 mcg Oral Tablet Take 1 Tablet (1,000 mcg total) by mouth Every 3 days    cyclobenzaprine  (FLEXERIL ) 10 mg Oral Tablet TAKE 1 TABLET (10 MG TOTAL) BY MOUTH EVERY NIGHT AS NEEDED    EPINEPHrine  0.3 mg/0.3 mL Injection Auto-Injector INJECT 0.3 ML (0.3 MG TOTAL) INTO THE MUSCLE ONCE, AS NEEDED FOR UP TO 1 DOSE    fluticasone  propionate (FLONASE ) 50 mcg/actuation Nasal Spray, Suspension Administer 2 Sprays into each nostril Once a day    Ibuprofen  (MOTRIN ) 800 mg Oral Tablet TAKE 1 TABLET BY  MOUTH EVERY 8 HOURS AS NEEDED FOR PAIN    Magnesium Gluconate 27 mg magnesium (500 mg) Oral Tablet Take 500 mg by mouth Once a day    Naratriptan  2.5 mg Oral Tablet TAKE 1 TABLET (2.5 MG TOTAL) BY MOUTH ONCE PER DAY AS NEEDED FOR MIGRAINE FOR UP TO 1 DOSE    ondansetron  (ZOFRAN ) 4 mg Oral Tablet Take 1 Tablet (4 mg total) by mouth Every 8 hours as needed    potassium chloride  (KLOR-CON  M20) 20 mEq Oral Tab Sust.Rel. Particle/Crystal TAKE 1 TABLET (20 MEQ TOTAL) BY MOUTH THREE TIMES A DAY    pregabalin  (LYRICA ) 50 mg Oral Capsule TAKE 1 CAPSULE BY MOUTH EVERY DAY IN THE MORNING, 1 CAP MID DAY, AND 2 CAPS AT BEDTIME    RABEprazole  (ACIPHEX ) 20 mg Oral Tablet, Delayed Release (E.C.) Take 1 Tablet (20 mg total) by mouth Daily    Saccharomyces boulardii (FLORASTOR) 250 mg Oral Capsule Take 1 Capsule (250 mg total) by mouth Daily    triamterene -hydroCHLOROthiazide  (MAXZIDE -25) 37.5-25 mg Oral Tablet TAKE 1 TABLET BY MOUTH EVERY DAY IN THE MORNING    valACYclovir  (VALTREX ) 1 gram Oral Tablet Take 1 Tablet (1 g total) by mouth Twice daily (Patient taking differently: Take 2 Tablets (2 g total) by mouth Twice daily)    valsartan  (DIOVAN ) 160 mg Oral Tablet TAKE 1 TABLET (160 MG TOTAL) BY MOUTH EVERY DAY     Social History:  Social History     Socioeconomic History    Marital status: Divorced   Tobacco Use    Smoking status: Never    Smokeless tobacco: Never   Vaping Use    Vaping status: Never Used   Substance and Sexual Activity    Alcohol use: Never    Drug use: Never           Review of Systems:  Other than ROS in the HPI, all other systems were negative.    Physical Exam:  Vital Signs:There were no vitals filed for this  visit.    Vitals and nursing note reviewed.     Physical Exam       Results         Previously completed labs, imaging studies and medical records were reviewed as part of this office visit.     Assessment/Plan:     No diagnosis found.    Assessment & Plan                         No follow-ups on  file.    Vida Nicol, DO     You can see your note(s) in [facility]. It is common for you to encounter certain medical terminology which may be unfamiliar to you. You might see results before your provider does so please give at least 2 business days for review. Please have this understanding, that NOT all abnormal results are significant. Our office will contact you for any urgent or emergent action if necessary. If you have any questions or concerns, feel free to send a MyChart message or call the office. Please call with any new or concerning symptoms.     This note was created with assistance from [employer] via capture of conversational audio. Consent was obtained from the patient and all parties present prior to recording.         [1]   Allergies  Allergen Reactions    Venom-Honey Bee Anaphylaxis

## 2024-04-24 NOTE — Progress Notes (Signed)
 FAMILY MEDICINE, [facility]  [address]  [place] [address]  Operated by [facility]     Name: Cassandra Elliott MRN:  [id number]   Date: 04/24/2024 Age: 58 y.o.          Provider: Harlene Spire, DO    Reason for visit: Follow Up 6 Months (Discuss vaccines - travel to [place] )      History of Present Illness  Cassandra Elliott is a 58 year old female who presents for a routine follow-up and medication management.    Weight gain and management  - Concerned about weight gain despite home monitoring suggesting weight loss    Travel health precautions  - Preparing for travel to [place]  - Contemplating vaccinations for yellow fever and hepatitis A  - Plans to use mosquito repellent  - Considering taking ciprofloxacin , azithromycin , and fluconazole  as prophylaxis for travel-related illnesses  - Plans to manage current medications (Lyrica , Aciphex , valsartan , diuretic) during travel    Grief and mood symptoms  - Experiencing grief following the loss of both parents  - Currently taking bupropion  for mood support  - Buspirone  available as needed  - Discontinued grief counseling due to scheduling difficulties  - Coping by staying busy and involved in activities    Preventive health maintenance  - Has not yet completed a mammogram; plans to address after returning from [place]  - Declines pneumococcal and shingles vaccines due to insurance coverage and personal preference  - Plans to complete routine laboratory work (CBC, A1c, thyroid  function, vitamin D , B12) after travel       PHQ Questionnaire  Little interest or pleasure in doing things.: Several Days  Feeling down, depressed, or hopeless: Several Days  PHQ 2 Total: 2  Trouble falling or staying asleep, or sleeping too much.: Several Days  Feeling tired or having little energy: Several Days  Poor appetite or overeating: Several Days  Feeling bad about yourself/ that you are a failure in the past 2 weeks?: Not at all  Trouble  concentrating on things in the past 2 weeks?: Not at all  Moving/Speaking slowly or being fidgety or restless  in the past 2 weeks?: Not at all  Thoughts that you would be better off DEAD, or of hurting yourself in some way.: Not at all  If you checked off any problems, how difficult have these problems made it for you to do your work, take care of things at home, or get along with other people?: Not difficult at all  PHQ 9 Total: 5  Interpretation of Total Score: 5-9 Mild depression              04/24/2024     2:25 PM   GAD-7 Questionnaire   Feeling nervous,anxious,on edge 0   Not being able to stop or control worrying 0   Worrying too much about different things 0   Trouble relaxing 0   Being so restless that it is hard to sit still 0   Becoming easily annoyed or irritable 1   Feeling afraid as if something awful might happen 0   How difficult have these problems made it for you to work, take care of things at home, or get along with other people? Not difficult at all   Gad-7 Score Total 1   Interpretation 0-4, normal         Historical Data    Past Medical History:  Past Medical History:   Diagnosis Date  B12 deficiency     Biceps tendinitis     Bulging lumbar disc     L3-L4 & T10-T11    Chronic radicular low back pain     Costochondritis     Endometriosis     Esophageal reflux     Floaters     left eye retina issue    Grief reaction with prolonged bereavement     Herniated disc, cervical     Hypertension     Hypokalemia     Migraines     Miscarriage 1986    Rash     Renal calculi     Somatic dysfunction of rib cage region     Umbilical hernia     @ birth    Upper respiratory infection     2024    Use of leuprolide acetate (Lupron)     2013 2014    Vitamin D  deficiency          Allergies:  Allergies[1]  Medications:  Current Outpatient Medications   Medication Sig    acyclovir  (ZOVIRAX ) 5 % Ointment Apply 1 Application topically Four times a day as needed for Other    amLODIPine  (NORVASC) 2.5 mg Oral Tablet Take  1 Tablet (2.5 mg total) by mouth Daily    aspirin 81 mg Oral Tablet, Chewable Chew 1 Tablet (81 mg total) Daily    azithromycin  (ZITHROMAX ) 250 mg Oral Tablet Take 500 mg (2 tab) on day 1; take 250 mg (1 tab) on days 2-5.    buPROPion  (WELLBUTRIN  XL) 150 mg extended release 24 hr tablet Take 1 Tablet (150 mg total) by mouth Daily    busPIRone  (BUSPAR ) 5 mg Oral Tablet Take 1 Tablet (5 mg total) by mouth Three times a day as needed    Butalbital -Acetaminophen -Caff 50-300-40 mg Oral Capsule Take 1 Capsule by mouth Every 8 hours as needed    cetirizine (ZYRTEC) 10 mg Oral Tablet Take 1 Tablet (10 mg total) by mouth Daily    Cholecalciferol, Vitamin D3, 25 mcg (1,000 unit) Oral Capsule Take 5 Capsules (5,000 Units total) by mouth Daily    ciprofloxacin  HCl (CIPRO ) 500 mg Oral Tablet Take 1 Tablet (500 mg total) by mouth Twice daily    cranberry conc/ascorbic acid (SUPER CRANBERRY ORAL) Take 8,400 mg by mouth Once a day Uses OTC 8400    cyanocobalamin (VITAMIN B 12) 1,000 mcg Oral Tablet Take 1 Tablet (1,000 mcg total) by mouth Every 3 days    cyclobenzaprine  (FLEXERIL ) 10 mg Oral Tablet Take 1 Tablet (10 mg total) by mouth Every night    EPINEPHrine  0.3 mg/0.3 mL Injection Auto-Injector INJECT 0.3 ML (0.3 MG TOTAL) INTO THE MUSCLE ONCE, AS NEEDED FOR UP TO 1 DOSE    fluconazole  (DIFLUCAN ) 150 mg Oral Tablet Take 1 Tablet (150 mg total) by mouth One time for 1 dose    fluticasone  propionate (FLONASE ) 50 mcg/actuation Nasal Spray, Suspension Administer 2 Sprays into each nostril Once a day    Ibuprofen  (MOTRIN ) 800 mg Oral Tablet TAKE 1 TABLET BY MOUTH EVERY 8 HOURS AS NEEDED FOR PAIN    Magnesium Gluconate 27 mg magnesium (500 mg) Oral Tablet Take 500 mg by mouth Once a day    Naratriptan  2.5 mg Oral Tablet TAKE 1 TABLET (2.5 MG TOTAL) BY MOUTH ONCE PER DAY AS NEEDED FOR MIGRAINE FOR UP TO 1 DOSE    ondansetron  (ZOFRAN ) 4 mg Oral Tablet Take 1 Tablet (4 mg total) by mouth Every 8 hours  as needed    potassium chloride   (KLOR-CON  M20) 20 mEq Oral Tab Sust.Rel. Particle/Crystal Take 1 Tablet (20 mEq total) by mouth Three times a day    pregabalin  (LYRICA ) 50 mg Oral Capsule TAKE 1 CAPSULE BY MOUTH EVERY DAY IN THE MORNING, 1 CAP MID DAY, AND 2 CAPS AT BEDTIME    RABEprazole  (ACIPHEX ) 20 mg Oral Tablet, Delayed Release (E.C.) Take 1 Tablet (20 mg total) by mouth Daily    Saccharomyces boulardii (FLORASTOR) 250 mg Oral Capsule Take 1 Capsule (250 mg total) by mouth Daily    triamterene -hydroCHLOROthiazide  (MAXZIDE -25) 37.5-25 mg Oral Tablet Take 1 Tablet by mouth Every morning    valACYclovir  (VALTREX ) 1 gram Oral Tablet Take 1 Tablet (1 g total) by mouth Twice daily (Patient taking differently: Take 2 Tablets (2 g total) by mouth Twice daily)    valsartan  (DIOVAN ) 160 mg Oral Tablet Take 1 Tablet (160 mg total) by mouth Daily    vit A/C/E ac/ZnOx/cupric oxide (EYE VITAMIN AND MINERALS ORAL) Take 2 Tablets by mouth Once a day     Social History:  Social History     Socioeconomic History    Marital status: Divorced   Tobacco Use    Smoking status: Never    Smokeless tobacco: Never   Vaping Use    Vaping status: Never Used   Substance and Sexual Activity    Alcohol use: Never    Drug use: Never           Review of Systems:  Other than ROS in the HPI, all other systems were negative.    Physical Exam:  Vital Signs:  Vitals:    04/24/24 1446   BP: 118/79   Pulse: 88   Temp: 36.7 C (98.1 F)   SpO2: 96%   Weight: 80.7 kg (178 lb)   Height: 1.651 m (5' 5)   BMI: 29.62       Vitals and nursing note reviewed.     Physical Exam  VITALS: P- 88, BP- 118/79, SaO2- 96%  CONSTITUTIONAL: Patient is not in acute distress, normal appearance.  HEAD EARS NOSE THROAT: Mucous membranes are moist.  EYES: Pupils are equal, round, and reactive to light.  NEUROLOGICAL: No focal deficit present, she is alert and oriented to person, place, and time.  NECK: No carotid bruit.  LYMPHADENOPATHY: No cervical adenopathy, thyroid  normal.  CARDIOVASCULAR: Normal rate  and regular rhythm, normal pulses, normal heart sounds, no murmur heard.  PULMONARY: Pulmonary effort is normal, normal breath sounds.  ABDOMINAL: Bowel sounds are normal, abdomen is soft, there is no abdominal tenderness, there is no guarding.  MUSCULOSKELETAL: No swelling or deformity, neck supple, no tenderness, no edema in right lower leg, no edema in left lower leg.  SKIN: Skin is warm and dry.  PSYCHIATRIC: Mood normal, thought content normal.     Results  LABS  Complete blood count: normal (10/2023)       Previously completed labs, imaging studies and medical records were reviewed as part of this office visit.     Assessment/Plan:         ICD-10-CM    1. Annual physical exam  Z00.00 CBC/DIFF     COMPREHENSIVE METABOLIC PANEL, NON-FASTING     HGA1C (HEMOGLOBIN A1C WITH EST AVG GLUCOSE)     LIPID PANEL     THYROID  STIMULATING HORMONE (SENSITIVE TSH)      2. Hypertension, unspecified type  I10 triamterene -hydroCHLOROthiazide  (MAXZIDE -25) 37.5-25 mg Oral Tablet  valsartan  (DIOVAN ) 160 mg Oral Tablet     amLODIPine  (NORVASC) 2.5 mg Oral Tablet     CBC/DIFF     COMPREHENSIVE METABOLIC PANEL, NON-FASTING     HGA1C (HEMOGLOBIN A1C WITH EST AVG GLUCOSE)     LIPID PANEL     VITAMIN D  25 TOTAL     VITAMIN B12      3. Back pain with radiculopathy  M54.10 pregabalin  (LYRICA ) 50 mg Oral Capsule     cyclobenzaprine  (FLEXERIL ) 10 mg Oral Tablet      4. Gastroesophageal reflux disease, unspecified whether esophagitis present  K21.9 RABEprazole  (ACIPHEX ) 20 mg Oral Tablet, Delayed Release (E.C.)      5. Hypokalemia  E87.6 potassium chloride  (KLOR-CON  M20) 20 mEq Oral Tab Sust.Rel. Particle/Crystal     COMPREHENSIVE METABOLIC PANEL, NON-FASTING      6. Migraines  G43.909 ondansetron  (ZOFRAN ) 4 mg Oral Tablet      7. Depressive disorder  F32.A buPROPion  (WELLBUTRIN  XL) 150 mg extended release 24 hr tablet      8. Breast cancer screening by mammogram  Z12.31 MAMMO BILATERAL SCREENING          Assessment & Plan  Adult Wellness  Visit  Routine wellness visit with well-controlled blood pressure and oxygen saturation. Labs due. Mammogram overdue. Vaccines discussed. Travel medications addressed.  - Ordered blood count, A1c, and thyroid  function tests.  - Ordered mammogram.  - Provided prescriptions for Cipro , Diflucan , azithromycin , and Zofran  for travel.  - Advised on mosquito repellent use during travel.    Depression  Managed with Bupropion  150 mg daily. Considering grief counseling.  - Continue Bupropion  150 mg daily.  - Discussed option to increase Bupropion  to 300 mg if needed.  - Encouraged consideration of grief counseling if needed.    Essential hypertension  Blood pressure well-controlled.  - Continue current antihypertensive regimen.    Gastroesophageal reflux disease (GERD)  - Continue current GERD management.    Hypokalemia  Advised on supplement option.  - Continue monitoring potassium levels.  - Advised on over-the-counter potassium supplements if needed.    Screening mammogram for breast cancer  Mammogram overdue. She acknowledges need but unscheduled.  - Ordered mammogram.         Return in about 6 months (around 10/23/2024) for In Person Visit - CDM.    Aidynn Krenn, DO     You can see your note(s) in [facility]. It is common for you to encounter certain medical terminology which may be unfamiliar to you. You might see results before your provider does so please give at least 2 business days for review. Please have this understanding, that NOT all abnormal results are significant. Our office will contact you for any urgent or emergent action if necessary. If you have any questions or concerns, feel free to send a MyChart message or call the office. Please call with any new or concerning symptoms.     This note was created with assistance from [employer] via capture of conversational audio. Consent was obtained from the patient and all parties present prior to recording.           [1]   Allergies  Allergen Reactions     Venom-Honey Bee Anaphylaxis

## 2024-04-26 ENCOUNTER — Encounter (RURAL_HEALTH_CENTER): Payer: Self-pay | Admitting: Family Medicine

## 2024-04-27 MED ORDER — DOXYCYCLINE HYCLATE 100 MG CAPSULE: 100.0000 mg | ORAL_CAPSULE | Freq: Every day | ORAL | 0 refills | Status: AC

## 2024-04-27 NOTE — Telephone Encounter (Signed)
 Rx sent.

## 2024-04-29 ENCOUNTER — Other Ambulatory Visit (RURAL_HEALTH_CENTER): Payer: Self-pay | Admitting: Family Medicine

## 2024-04-29 ENCOUNTER — Other Ambulatory Visit (HOSPITAL_BASED_OUTPATIENT_CLINIC_OR_DEPARTMENT_OTHER): Payer: Self-pay

## 2024-05-03 ENCOUNTER — Ambulatory Visit: Attending: Family Medicine | Admitting: Family Medicine

## 2024-05-03 ENCOUNTER — Ambulatory Visit (RURAL_HEALTH_CENTER): Attending: Family Medicine

## 2024-05-03 ENCOUNTER — Other Ambulatory Visit: Payer: Self-pay

## 2024-05-03 DIAGNOSIS — I10 Essential (primary) hypertension: Secondary | ICD-10-CM | POA: Insufficient documentation

## 2024-05-03 DIAGNOSIS — E876 Hypokalemia: Secondary | ICD-10-CM | POA: Insufficient documentation

## 2024-05-03 DIAGNOSIS — Z0000 Encounter for general adult medical examination without abnormal findings: Secondary | ICD-10-CM | POA: Insufficient documentation

## 2024-05-03 LAB — CBC WITH DIFF
BASOPHIL #: 0 x10ˆ3/uL (ref 0.00–0.10)
BASOPHIL %: 1 % (ref 0–1)
EOSINOPHIL #: 0.2 x10ˆ3/uL (ref 0.00–0.50)
EOSINOPHIL %: 3 % (ref 1–7)
HCT: 35.3 % (ref 31.2–41.9)
HGB: 12.2 g/dL (ref 10.9–14.3)
LYMPHOCYTE #: 2.6 x10ˆ3/uL (ref 1.10–3.10)
LYMPHOCYTE %: 42 % (ref 16–46)
MCH: 31.4 pg (ref 24.7–32.8)
MCHC: 34.5 g/dL (ref 32.3–35.6)
MCV: 91.1 fL (ref 75.5–95.3)
MONOCYTE #: 0.4 x10ˆ3/uL (ref 0.20–0.90)
MONOCYTE %: 7 % (ref 4–11)
MPV: 8.5 fL (ref 7.9–10.8)
NEUTROPHIL #: 3 x10ˆ3/uL (ref 1.90–8.20)
NEUTROPHIL %: 47 % (ref 43–77)
PLATELETS: 359 x10ˆ3/uL (ref 140–440)
RBC: 3.88 x10ˆ6/uL (ref 3.63–4.92)
RDW: 13.3 % (ref 12.3–17.7)
WBC: 6.3 x10ˆ3/uL (ref 3.8–11.8)

## 2024-05-03 LAB — LIPID PANEL
CHOL/HDL RATIO: 3.2
CHOLESTEROL: 146 mg/dL (ref <200)
HDL CHOL: 46 mg/dL (ref >=40)
LDL CALC: 88 mg/dL (ref 0–100)
TRIGLYCERIDES: 60 mg/dL (ref <=150)
VLDL CALC: 12 mg/dL (ref 0–50)

## 2024-05-03 LAB — COMPREHENSIVE METABOLIC PANEL, NON-FASTING
ALBUMIN/GLOBULIN RATIO: 1.8 — ABNORMAL HIGH (ref 0.8–1.4)
ALBUMIN: 4.3 g/dL (ref 3.5–5.7)
ALKALINE PHOSPHATASE: 94 U/L (ref 34–104)
ALT (SGPT): 10 U/L (ref 7–52)
ANION GAP: 7 mmol/L (ref 4–13)
AST (SGOT): 17 U/L (ref 13–39)
BILIRUBIN TOTAL: 0.6 mg/dL (ref 0.3–1.0)
BUN/CREA RATIO: 24 — ABNORMAL HIGH (ref 6–22)
BUN: 21 mg/dL (ref 7–25)
CALCIUM, CORRECTED: 9.1 mg/dL (ref 8.9–10.8)
CALCIUM: 9.3 mg/dL (ref 8.6–10.3)
CHLORIDE: 102 mmol/L (ref 98–107)
CO2 TOTAL: 26 mmol/L (ref 21–31)
CREATININE: 0.87 mg/dL (ref 0.60–1.30)
ESTIMATED GFR: 77 mL/min/1.73mˆ2 (ref >59)
GLOBULIN: 2.4 (ref 2.0–3.5)
GLUCOSE: 88 mg/dL (ref 74–109)
OSMOLALITY, CALCULATED: 273 mosm/kg (ref 270–290)
POTASSIUM: 3.8 mmol/L (ref 3.5–5.1)
PROTEIN TOTAL: 6.7 g/dL (ref 6.4–8.9)
SODIUM: 135 mmol/L — ABNORMAL LOW (ref 136–145)

## 2024-05-03 LAB — VITAMIN B12: VITAMIN B 12: 681 pg/mL (ref 180–914)

## 2024-05-03 LAB — VITAMIN D 25 TOTAL: VITAMIN D 25, TOTAL: 48.37 ng/mL (ref 30.00–100.00)

## 2024-05-03 LAB — THYROID STIMULATING HORMONE (SENSITIVE TSH): TSH: 2.265 u[IU]/mL (ref 0.450–5.330)

## 2024-05-03 NOTE — Nursing Note (Signed)
 Patient in for labs only. See orders in system.

## 2024-05-04 LAB — HGA1C (HEMOGLOBIN A1C WITH EST AVG GLUCOSE): HEMOGLOBIN A1C: 5.6 % (ref 4.0–6.0)

## 2024-05-09 ENCOUNTER — Other Ambulatory Visit: Payer: Self-pay

## 2024-10-24 ENCOUNTER — Ambulatory Visit (RURAL_HEALTH_CENTER): Payer: Self-pay | Admitting: Family Medicine
